# Patient Record
Sex: Female | Born: 1985
Health system: Southern US, Community
[De-identification: ages and names within clinical notes are randomized; demographics above are authoritative.]

## PROBLEM LIST (undated history)

## (undated) DIAGNOSIS — F32A Depression, unspecified: Secondary | ICD-10-CM

## (undated) DIAGNOSIS — J069 Acute upper respiratory infection, unspecified: Secondary | ICD-10-CM

## (undated) DIAGNOSIS — F419 Anxiety disorder, unspecified: Secondary | ICD-10-CM

## (undated) DIAGNOSIS — R87629 Unspecified abnormal cytological findings in specimens from vagina: Secondary | ICD-10-CM

## (undated) DIAGNOSIS — J45909 Unspecified asthma, uncomplicated: Secondary | ICD-10-CM

## (undated) DIAGNOSIS — F329 Major depressive disorder, single episode, unspecified: Secondary | ICD-10-CM

## (undated) DIAGNOSIS — Z8614 Personal history of Methicillin resistant Staphylococcus aureus infection: Secondary | ICD-10-CM

## (undated) DIAGNOSIS — L309 Dermatitis, unspecified: Secondary | ICD-10-CM

## (undated) DIAGNOSIS — J302 Other seasonal allergic rhinitis: Secondary | ICD-10-CM

## (undated) DIAGNOSIS — B977 Papillomavirus as the cause of diseases classified elsewhere: Secondary | ICD-10-CM

## (undated) DIAGNOSIS — T8189XA Other complications of procedures, not elsewhere classified, initial encounter: Secondary | ICD-10-CM

## (undated) DIAGNOSIS — B999 Unspecified infectious disease: Secondary | ICD-10-CM

## (undated) DIAGNOSIS — W57XXXA Bitten or stung by nonvenomous insect and other nonvenomous arthropods, initial encounter: Secondary | ICD-10-CM

## (undated) DIAGNOSIS — R51 Headache: Secondary | ICD-10-CM

## (undated) HISTORY — PX: WISDOM TOOTH EXTRACTION: SHX21

## (undated) HISTORY — PX: TONSILLECTOMY AND ADENOIDECTOMY: SHX28

## (undated) HISTORY — PX: ADENOIDECTOMY: SUR15

## (undated) HISTORY — DX: Acute upper respiratory infection, unspecified: J06.9

## (undated) HISTORY — PX: TONSILLECTOMY: SUR1361

## (undated) HISTORY — DX: Dermatitis, unspecified: L30.9

## (undated) HISTORY — PX: TYMPANOSTOMY TUBE PLACEMENT: SHX32

---
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
## 2006-01-27 DIAGNOSIS — Z8614 Personal history of Methicillin resistant Staphylococcus aureus infection: Secondary | ICD-10-CM

## 2006-01-27 HISTORY — DX: Personal history of Methicillin resistant Staphylococcus aureus infection: Z86.14

## 2012-01-13 DIAGNOSIS — H52209 Unspecified astigmatism, unspecified eye: Secondary | ICD-10-CM | POA: Insufficient documentation

## 2013-02-07 ENCOUNTER — Other Ambulatory Visit: Payer: Self-pay | Admitting: Family Medicine

## 2013-02-07 ENCOUNTER — Emergency Department (INDEPENDENT_AMBULATORY_CARE_PROVIDER_SITE_OTHER)
Admission: EM | Admit: 2013-02-07 | Discharge: 2013-02-07 | Disposition: A | Discharge status: Home / Self Care | Payer: 59 | Source: Home / Self Care | Attending: Family Medicine | Admitting: Family Medicine

## 2013-02-07 ENCOUNTER — Ambulatory Visit
Admission: RE | Admit: 2013-02-07 | Discharge: 2013-02-07 | Disposition: A | Discharge status: Home / Self Care | Payer: 59 | Source: Ambulatory Visit | Attending: Family Medicine | Admitting: Family Medicine

## 2013-02-07 ENCOUNTER — Encounter: Payer: Self-pay | Admitting: Emergency Medicine

## 2013-02-07 ENCOUNTER — Other Ambulatory Visit: Payer: 59

## 2013-02-07 ENCOUNTER — Telehealth: Payer: Self-pay | Admitting: *Deleted

## 2013-02-07 DIAGNOSIS — N6002 Solitary cyst of left breast: Secondary | ICD-10-CM

## 2013-02-07 DIAGNOSIS — N611 Abscess of the breast and nipple: Secondary | ICD-10-CM

## 2013-02-07 DIAGNOSIS — N6089 Other benign mammary dysplasias of unspecified breast: Secondary | ICD-10-CM

## 2013-02-07 DIAGNOSIS — N6082 Other benign mammary dysplasias of left breast: Secondary | ICD-10-CM

## 2013-02-07 DIAGNOSIS — N61 Mastitis without abscess: Secondary | ICD-10-CM

## 2013-02-07 HISTORY — DX: Unspecified asthma, uncomplicated: J45.909

## 2013-02-07 MED ORDER — TRAMADOL HCL 50 MG PO TABS: ORAL_TABLET | ORAL | Status: DC

## 2013-02-07 MED ORDER — DOXYCYCLINE HYCLATE 100 MG PO CAPS: 100.0000 mg | ORAL_CAPSULE | Freq: Two times a day (BID) | ORAL | Status: DC

## 2013-02-07 NOTE — ED Provider Notes (Signed)
CSN: [id number]     Arrival date & time 02/07/13  [id number] History   First MD Initiated Contact with Patient 02/07/13 [phone]     Chief Complaint  Patient presents with  . Abscess    left chest/breast      HPI Comments: Two days ago patient develop a painful swollen area on the medial edge of her left breast.  The area has become more swollen, tender and erythematous.  She had a low grade fever to 99.8.  No drainage from the area.  Patient is a 28 y.o. female presenting with abscess. The history is provided by the patient.  Abscess Abscess location: left breast. Size:  3cm Abscess quality: induration, painful and redness   Abscess quality: not draining, no fluctuance, no itching, no warmth and not weeping   Red streaking: no   Duration:  2 days Progression:  Worsening Pain details:    Quality:  Pressure   Severity:  Moderate   Duration:  2 days   Timing:  Constant   Progression:  Worsening Chronicity:  New Relieved by:  Nothing Worsened by:  Nothing tried Ineffective treatments:  None tried Associated symptoms: fever and nausea   Associated symptoms: no fatigue and no vomiting     Past Medical History  Diagnosis Date  . Asthma    Past Surgical History  Procedure Laterality Date  . Tonsillectomy and adenoidectomy    . Myringotomy     Family History  Problem Relation Age of Onset  . Hyperlipidemia Father   . Hypertension Father    History  Substance Use Topics  . Smoking status: Never Smoker   . Smokeless tobacco: Never Used  . Alcohol Use: No   OB History   Grav Para Term Preterm Abortions TAB SAB Ect Mult Living                 Review of Systems  Constitutional: Positive for fever. Negative for fatigue.  Gastrointestinal: Positive for nausea. Negative for vomiting.    Allergies  Polytrim  Home Medications   Current Outpatient Rx  Name  Route  Sig  Dispense  Refill  . azithromycin (ZITHROMAX) 250 MG tablet   Oral   Take by mouth daily. Currently taking  for sinus infection         . clindamycin (CLINDAGEL) 1 % gel   Topical   Apply topically 2 (two) times daily.         . fexofenadine (ALLEGRA) 180 MG tablet   Oral   Take 180 mg by mouth daily.         . Multiple Vitamin (MULTIVITAMIN) tablet   Oral   Take 1 tablet by mouth daily.         . Omega-3 Fatty Acids (FISH OIL CONCENTRATE PO)   Oral   Take by mouth.         . doxycycline (VIBRAMYCIN) 100 MG capsule   Oral   Take 1 capsule (100 mg total) by mouth 2 (two) times daily.   20 capsule   0   . traMADol (ULTRAM) 50 MG tablet      Take one or two tabs by mouth Q4 to 6hr as needed for pain Maximum dose= 8 tablets per day   20 tablet   0    BP 127/86  Pulse 60  Temp(Src) 98.5 F (36.9 C) (Oral)  Resp 14  Ht 5\' 3"  (1.6 m)  Wt 223 lb (101.152 kg)  BMI 39.51 kg/m2  SpO2 100%  LMP 02/07/2013 Physical Exam  Nursing note and vitals reviewed. Constitutional: She is oriented to person, place, and time. She appears well-developed and well-nourished. No distress.  Patient is obese (BMI 39.5)  HENT:  Head: Normocephalic.  Eyes: Conjunctivae are normal. Pupils are equal, round, and reactive to light.  Cardiovascular: Normal heart sounds.   Pulmonary/Chest: Breath sounds normal. She exhibits mass, tenderness and swelling. She exhibits no bony tenderness and no deformity. Left breast exhibits tenderness. Left breast exhibits no inverted nipple, no mass, no nipple discharge and no skin change. Breasts are symmetrical.    At the medial edge of left breast is a 3cm diameter tender erythematous indurated swollen area, as noted on diagram.  Musculoskeletal: She exhibits no tenderness.  Neurological: She is alert and oriented to person, place, and time.  Skin: Skin is warm and dry.    ED Course  Procedures  none   Imaging Review US Breast Ltd Uni Left Inc Axilla  02/07/2013   CLINICAL DATA:  Patient has an erythematous painful nodule at the sternal border at 8:30  o'clock on the left. This has developed rapidly since 02/05/2013.  EXAM: ULTRASOUND OF THE LEFT BREAST  COMPARISON:  NONE  FINDINGS: On physical exam,there is a 1.5 cm erythematous tender nodule at 8:30 o'clock at the left sternal border. The skin surrounding this nodule is erythematous.  Ultrasound is performed, showing an irregular fluid collection within the skin with a tract to the skin surface. This measures approximately 1.5 x 1.4 x 1.0 cm. This is consistent with an infected sebaceous cyst.  IMPRESSION: Infected sebaceous cyst at the sternal border on the left at 8:30 o'clock.  RECOMMENDATION: Suggest a course of antibiotics and clinical follow-up.  Report was telephoned to Dr. Cathren Harsh who will prescribe a course of antibiotics for the patient and will follow her clinically.  Yearly screening mammography should begin at age 76 unless clinically indicated earlier.  I have discussed the findings and recommendations with the patient. Results were also provided in writing at the conclusion of the visit. If applicable, a reminder letter will be sent to the patient regarding the next appointment.  BI-RADS CATEGORY  2: Benign Finding(s)   Electronically Signed   By: Cain Saupe M.D.   On: 02/07/2013 15:14      MDM   1. Breast abscess of female   2. Sebaceous cyst of breast, left    Begin doxycycline 100mg  BID.  Tramadol for pain.  Begin warm compresses.  Return in 48 hours for I and D.    Lattie Haw, MD 02/08/13 [phone]

## 2013-02-07 NOTE — ED Notes (Signed)
Deanna Gross c/o abscess to left chest/breast  X 2 days. Reports low grade fever and body aches last night, resolved today. Site is positive for pain redness and induration.

## 2013-02-07 NOTE — Discharge Instructions (Signed)
Breastfeeding and Mastitis °Mastitis is inflammation of the breast tissue. It can occur in women who are breastfeeding. This can make breastfeeding painful. Mastitis will sometimes go away on its own. Your health care provider will help determine if treatment is needed. °CAUSES °Mastitis is often associated with a blocked milk (lactiferous) duct. This can happen when too much milk builds up in the breast. Causes of excess milk in the breast can include: °· Poor latch-on. If your baby is not latched onto the breast properly, she or he may not empty your breast completely while breastfeeding. °· Allowing too much time to pass between feedings. °· Wearing a bra or other clothing that is too tight. This puts extra pressure on the lactiferous ducts so milk does not flow through them as it should. °Mastitis can also be caused by a bacterial infection. Bacteria may enter the breast tissue through cuts or openings in the skin. In women who are breastfeeding, this may occur because of cracked or irritated skin. Cracks in the skin are often caused when your baby does not latch on properly to the breast. °SIGNS AND SYMPTOMS °· Swelling, redness, tenderness, and pain in an area of the breast. °· Swelling of the glands under the arm on the same side. °· Fever may or may not accompany mastitis. °If an infection is allowed to progress, a collection of pus (abscess) may develop. °DIAGNOSIS  °Your health care provider can usually diagnose mastitis based on your symptoms and a physical exam. Tests may be done to help confirm the diagnosis. These may include: °· Removal of pus from the breast by applying pressure to the area. This pus can be examined in the lab to determine which bacteria are present. If an abscess has developed, the fluid in the abscess can be removed with a needle. This can also be used to confirm the diagnosis and determine the bacteria present. In most cases, pus will not be present. °· Blood tests to determine if  your body is fighting a bacterial infection. °· Mammogram or ultrasound tests to rule out other problems or diseases. °TREATMENT  °Mastitis that occurs with breastfeeding will sometimes go away on its own. Your health care provider may choose to wait 24 hours after first seeing you to decide whether a prescription medicine is needed. If your symptoms are worse after 24 hours, your health care provider will likely prescribe an antibiotic to treat the mastitis. He or she will determine which bacteria are most likely causing the infection and will then select an appropriate antibiotic. This is sometimes changed based on the results of tests performed to identify the bacteria, or if there is no response to the antibiotic selected. Antibiotics are usually given by mouth. You may also be given medicine for pain. °HOME CARE INSTRUCTIONS °· Only take over-the-counter or prescription medicines for pain, fever, or discomfort as directed by your health care provider. °· If your health care provider prescribed an antibiotic, take the medicine as directed. Make sure you finish it even if you start to feel better. °· Do not wear a tight or underwire bra. Wear a soft, supportive bra. °· Increase your fluid intake, especially if you have a fever. °· Continue to empty the breast. Your health care provider can tell you whether this milk is safe for your infant or needs to be thrown out. You may be told to stop nursing until your health care provider thinks it is safe for your baby. Use a breast pump if   you are advised to stop nursing. °· Keep your nipples clean and dry. °· Empty the first breast completely before going to the other breast. If your baby is not emptying your breasts completely for some reason, use a breast pump to empty your breasts. °· If you go back to work, pump your breasts while at work to stay in time with your nursing schedule. °· Avoid allowing your breasts to become overly filled with milk (engorged). °SEEK  MEDICAL CARE IF: °· You have pus-like discharge from the breast. °· Your symptoms do not improve with the treatment prescribed by your health care provider within 2 days. °SEEK IMMEDIATE MEDICAL CARE IF: °· Your pain and swelling are getting worse. °· You have pain that is not controlled with medicine. °· You have a red line extending from the breast toward your armpit. °· You have a fever or persistent symptoms for more than 2 3 days. °· You have a fever and your symptoms suddenly get worse. °MAKE SURE YOU:  °· Understand these instructions. °· Will watch your condition. °· Will get help right away if you are not doing well or get worse. °Document Released: 05/10/2004 Document Revised: 09/15/2012 Document Reviewed: 08/19/2012 °ExitCare® Patient Information ©2014 [employer]. ° ° °Abscess °An abscess is an infected area that contains a collection of pus and debris. It can occur in almost any part of the body. An abscess is also known as a furuncle or boil. °CAUSES  °An abscess occurs when tissue gets infected. This can occur from blockage of oil or sweat glands, infection of hair follicles, or a minor injury to the skin. As the body tries to fight the infection, pus collects in the area and creates pressure under the skin. This pressure causes pain. People with weakened immune systems have difficulty fighting infections and get certain abscesses more often.  °SYMPTOMS °Usually an abscess develops on the skin and becomes a painful mass that is red, warm, and tender. If the abscess forms under the skin, you may feel a moveable soft area under the skin. Some abscesses break open (rupture) on their own, but most will continue to get worse without care. The infection can spread deeper into the body and eventually into the bloodstream, causing you to feel ill.  °DIAGNOSIS  °Your caregiver will take your medical history and perform a physical exam. A sample of fluid may also be taken from the abscess to determine what is  causing your infection. °TREATMENT  °Your caregiver may prescribe antibiotic medicines to fight the infection. However, taking antibiotics alone usually does not cure an abscess. Your caregiver may need to make a small cut (incision) in the abscess to drain the pus. In some cases, gauze is packed into the abscess to reduce pain and to continue draining the area. °HOME CARE INSTRUCTIONS  °· Only take over-the-counter or prescription medicines for pain, discomfort, or fever as directed by your caregiver. °· If you were prescribed antibiotics, take them as directed. Finish them even if you start to feel better. °· If gauze is used, follow your caregiver's directions for changing the gauze. °· To avoid spreading the infection: °· Keep your draining abscess covered with a bandage. °· Wash your hands well. °· Do not share personal care items, towels, or whirlpools with others. °· Avoid skin contact with others. °· Keep your skin and clothes clean around the abscess. °· Keep all follow-up appointments as directed by your caregiver. °SEEK MEDICAL CARE IF:  °· You have increased   pain, swelling, redness, fluid drainage, or bleeding. °· You have muscle aches, chills, or a general ill feeling. °· You have a fever. °MAKE SURE YOU:  °· Understand these instructions. °· Will watch your condition. °· Will get help right away if you are not doing well or get worse. °Document Released: 10/23/2004 Document Revised: 07/15/2011 Document Reviewed: 03/28/2011 °ExitCare® Patient Information ©2014 [employer]. ° °

## 2013-02-09 ENCOUNTER — Emergency Department
Admission: EM | Admit: 2013-02-09 | Discharge: 2013-02-09 | Disposition: A | Discharge status: Home / Self Care | Payer: 59 | Source: Home / Self Care | Attending: Family Medicine | Admitting: Family Medicine

## 2013-02-09 ENCOUNTER — Encounter: Payer: Self-pay | Admitting: Emergency Medicine

## 2013-02-09 DIAGNOSIS — L723 Sebaceous cyst: Secondary | ICD-10-CM

## 2013-02-09 DIAGNOSIS — N6089 Other benign mammary dysplasias of unspecified breast: Secondary | ICD-10-CM

## 2013-02-09 DIAGNOSIS — L089 Local infection of the skin and subcutaneous tissue, unspecified: Secondary | ICD-10-CM

## 2013-02-09 DIAGNOSIS — N6082 Other benign mammary dysplasias of left breast: Secondary | ICD-10-CM

## 2013-02-09 MED ORDER — MELOXICAM 7.5 MG PO TABS: 7.5000 mg | ORAL_TABLET | Freq: Every day | ORAL | Status: DC

## 2013-02-09 MED ORDER — KETOROLAC TROMETHAMINE 30 MG/ML IJ SOLN
30.0000 mg | Freq: Once | INTRAMUSCULAR | Status: AC
  Administered 2013-02-09: 30 mg via INTRAMUSCULAR

## 2013-02-09 MED ORDER — HYDROCODONE-ACETAMINOPHEN 5-325 MG PO TABS: 1.0000 | ORAL_TABLET | Freq: Four times a day (QID) | ORAL | Status: DC | PRN

## 2013-02-09 MED ORDER — CLINDAMYCIN HCL 150 MG PO CAPS: 150.0000 mg | ORAL_CAPSULE | Freq: Four times a day (QID) | ORAL | Status: DC

## 2013-02-09 NOTE — Discharge Instructions (Signed)
Leave bandage in place until followup tomorrow.  Apply heating pad or hot water bottle several times daily.  Stop doxycycline for now.    Epidermal Cyst An epidermal cyst is usually a small, painless lump under the skin. Cysts often occur on the face, neck, stomach, chest, or genitals. The cyst may be filled with a bad smelling paste. Do not pop your cyst. Popping the cyst can cause pain and puffiness (swelling). HOME CARE   Only take medicines as told by your doctor.  Take your medicine (antibiotics) as told. Finish it even if you start to feel better. GET HELP RIGHT AWAY IF:  Your cyst is tender, red, or puffy.  You are not getting better, or you are getting worse.  You have any questions or concerns. MAKE SURE YOU:  Understand these instructions.  Will watch your condition.  Will get help right away if you are not doing well or get worse. Document Released: 02/21/2004 Document Revised: 07/15/2011 Document Reviewed: 07/22/2010 [facility] Patient Information 2014 [place], [place].

## 2013-02-09 NOTE — ED Notes (Signed)
The pt is here today for a f/u of the abscess under her LT breast x 4 days. Denies fever.

## 2013-02-09 NOTE — ED Provider Notes (Signed)
CSN: [id number]     Arrival date & time 02/09/13  1018 History   First MD Initiated Contact with Patient 02/09/13 1113     Chief Complaint  Patient presents with  . Breast Mass      HPI Comments: Patient returns today for I and D of sebaceous cyst at medial border of left breast.  She reports that redness has decreased somewhat while taking doxycycline but area is still painful.  No fevers, chills, and sweats.  No drainage from lesion.  The history is provided by the patient.    Past Medical History  Diagnosis Date  . Asthma    Past Surgical History  Procedure Laterality Date  . Tonsillectomy and adenoidectomy    . Myringotomy     Family History  Problem Relation Age of Onset  . Hyperlipidemia Father   . Hypertension Father    History  Substance Use Topics  . Smoking status: Never Smoker   . Smokeless tobacco: Never Used  . Alcohol Use: No   OB History   Grav Para Term Preterm Abortions TAB SAB Ect Mult Living                 Review of Systems  Constitutional: Negative for fever, chills and fatigue.  Skin:       Persistent painful nodule left medial breast.  All other systems reviewed and are negative.    Allergies  Polytrim  Home Medications   Current Outpatient Rx  Name  Route  Sig  Dispense  Refill  . azithromycin (ZITHROMAX) 250 MG tablet   Oral   Take by mouth daily. Currently taking for sinus infection         . clindamycin (CLEOCIN) 150 MG capsule   Oral   Take 1 capsule (150 mg total) by mouth 4 (four) times daily.   28 capsule   0   . clindamycin (CLINDAGEL) 1 % gel   Topical   Apply topically 2 (two) times daily.         Marland Kitchen doxycycline (VIBRAMYCIN) 100 MG capsule   Oral   Take 1 capsule (100 mg total) by mouth 2 (two) times daily.   20 capsule   0   . fexofenadine (ALLEGRA) 180 MG tablet   Oral   Take 180 mg by mouth daily.         Marland Kitchen HYDROcodone-acetaminophen (NORCO/VICODIN) 5-325 MG per tablet   Oral   Take 1 tablet by  mouth every 6 (six) hours as needed for moderate pain.   12 tablet   0   . meloxicam (MOBIC) 7.5 MG tablet   Oral   Take 1 tablet (7.5 mg total) by mouth daily. Take with food   7 tablet   1   . Multiple Vitamin (MULTIVITAMIN) tablet   Oral   Take 1 tablet by mouth daily.         . Omega-3 Fatty Acids (FISH OIL CONCENTRATE PO)   Oral   Take by mouth.         . traMADol (ULTRAM) 50 MG tablet      Take one or two tabs by mouth Q4 to 6hr as needed for pain Maximum dose= 8 tablets per day   20 tablet   0    BP 119/81  Pulse 72  Temp(Src) 97.9 F (36.6 C) (Oral)  Resp 18  SpO2 100%  LMP 02/07/2013 Physical Exam  Nursing note and vitals reviewed. Constitutional: She is oriented to person, place,  and time. She appears well-developed and well-nourished. No distress.  Eyes: Conjunctivae are normal. Pupils are equal, round, and reactive to light.  Neck: Neck supple.  Pulmonary/Chest: Left breast exhibits mass and tenderness.    Persistent 5cm dia area of erythema, swelling, and tenderness.  Erythema is decreased somewhat but lesion remains tender to palpation.  Lesion is more indurated than fluctuant.  Lymphadenopathy:    She has no cervical adenopathy.  Neurological: She is alert and oriented to person, place, and time.  Skin: Skin is warm and dry.    ED Course  Procedures  Incise and drain cyst/abscess Risks and benefits of procedure explained to patient and verbal consent obtained.  Using sterile technique and local anesthesia with 1% lidocaine with epinephrine, cleansed affected area with Betadine and alcohol. Identified the central area of lesion and incised with #11 blade.  Expressed blood and minimal amount of sebaceous material.  Probed wound cavity circumferentially; unable to locate a significant cyst sac. Inserted a short length of Iodoform in order to maintain patency of wound.  Bandage applied.  Patient tolerated well     Labs Reviewed  WOUND CULTURE    Imaging Review [place] Breast Ltd Uni Left Inc Axilla  02/07/2013   CLINICAL DATA:  Patient has an erythematous painful nodule at the sternal border at 8:30 o'clock on the left. This has developed rapidly since 02/05/2013.  EXAM: ULTRASOUND OF THE LEFT BREAST  COMPARISON:  NONE  FINDINGS: On physical exam,there is a 1.5 cm erythematous tender nodule at 8:30 o'clock at the left sternal border. The skin surrounding this nodule is erythematous.  Ultrasound is performed, showing an irregular fluid collection within the skin with a tract to the skin surface. This measures approximately 1.5 x 1.4 x 1.0 cm. This is consistent with an infected sebaceous cyst.  IMPRESSION: Infected sebaceous cyst at the sternal border on the left at 8:30 o'clock.  RECOMMENDATION: Suggest a course of antibiotics and clinical follow-up.  Report was telephoned to Dr. Cathren HarshBeese who will prescribe a course of antibiotics for the patient and will follow her clinically.  Yearly screening mammography should begin at age 28 unless clinically indicated earlier.  I have discussed the findings and recommendations with the patient. Results were also provided in writing at the conclusion of the visit. If applicable, a reminder letter will be sent to the patient regarding the next appointment.  BI-RADS CATEGORY  2: Benign Finding(s)   Electronically Signed   By: Cain SaupeElizabeth  Eagle M.D.   On: 02/07/2013 15:14      MDM   1. Sebaceous cyst of skin of left breast; concerned that minimal sebaceous material expressed with I and D   2. Infected sebaceous cyst     Wound culture taken.  Begin Clindamycin.  Lortab for pain.  Begin Mobic. Leave bandage in place until followup tomorrow.  Apply heating pad or hot water bottle several times daily.  Stop doxycycline for now. Return tomorrow for follow-up and removal of wick.  If not clinically improving would refer to surgeon.    Lattie HawStephen A Seira Cody, MD 02/09/13 [phone]

## 2013-02-10 ENCOUNTER — Encounter (INDEPENDENT_AMBULATORY_CARE_PROVIDER_SITE_OTHER): Payer: Self-pay | Admitting: Surgery

## 2013-02-10 ENCOUNTER — Emergency Department
Admission: EM | Admit: 2013-02-10 | Discharge: 2013-02-10 | Disposition: A | Discharge status: Home / Self Care | Payer: 59 | Source: Home / Self Care | Attending: Family Medicine | Admitting: Family Medicine

## 2013-02-10 ENCOUNTER — Ambulatory Visit (INDEPENDENT_AMBULATORY_CARE_PROVIDER_SITE_OTHER): Payer: Commercial Managed Care - PPO | Admitting: Surgery

## 2013-02-10 ENCOUNTER — Encounter: Payer: Self-pay | Admitting: Emergency Medicine

## 2013-02-10 ENCOUNTER — Telehealth: Payer: Self-pay | Admitting: *Deleted

## 2013-02-10 VITALS — BP 122/84 | HR 71 | Temp 98.4°F | Resp 16 | Ht 64.0 in | Wt 222.6 lb

## 2013-02-10 DIAGNOSIS — L02219 Cutaneous abscess of trunk, unspecified: Secondary | ICD-10-CM

## 2013-02-10 DIAGNOSIS — N611 Abscess of the breast and nipple: Secondary | ICD-10-CM

## 2013-02-10 DIAGNOSIS — N6089 Other benign mammary dysplasias of unspecified breast: Secondary | ICD-10-CM

## 2013-02-10 DIAGNOSIS — L03319 Cellulitis of trunk, unspecified: Principal | ICD-10-CM

## 2013-02-10 NOTE — ED Notes (Signed)
Deanna Gross is here for dressing change and wound check for abscess of left breast. Pt reports pain is improved.

## 2013-02-10 NOTE — Patient Instructions (Signed)
Shower wound at least daily and use antibacterial soap.  Remove packing from wound on Saturday, 02/12/2013.  Keep the wound covered with dry gauze dressing and change as needed.  Take entire course of clindamycin as prescribed.  Return for surgical assessment 4-5 days.  Velora Hecklerodd M. Lizett Chowning, MD, [facility] LLCFACS [facility] Office: [phone]

## 2013-02-10 NOTE — ED Provider Notes (Signed)
CSN: [id number]     Arrival date & time 02/10/13  1021 History   First MD Initiated Contact with Patient 02/10/13 1026     Chief Complaint  Patient presents with  . Wound Check    HPI  Pt presents today for wound recheck  Was originally seen on 1/12 for breast abscess. Had L breast u/s indicative of sebaceous cyst (infected)  Area was I and D'd here at [facility].  Wound culture obtained- still pending.  Was placed on doxy, then swiched to clinda at follow up yesterday Symptomatically, pt still with persistent pain. Mild improvement in redness.  No fevers or chills.  Increased drainage of foul smelling, purulent fluid.    Past Medical History  Diagnosis Date  . Asthma    Past Surgical History  Procedure Laterality Date  . Tonsillectomy and adenoidectomy    . Myringotomy     Family History  Problem Relation Age of Onset  . Hyperlipidemia Father   . Hypertension Father    History  Substance Use Topics  . Smoking status: Never Smoker   . Smokeless tobacco: Never Used  . Alcohol Use: No   OB History   Grav Para Term Preterm Abortions TAB SAB Ect Mult Living                 Review of Systems  All other systems reviewed and are negative.    Allergies  Polytrim  Home Medications   Current Outpatient Rx  Name  Route  Sig  Dispense  Refill  . azithromycin (ZITHROMAX) 250 MG tablet   Oral   Take by mouth daily. Currently taking for sinus infection         . clindamycin (CLEOCIN) 150 MG capsule   Oral   Take 1 capsule (150 mg total) by mouth 4 (four) times daily.   28 capsule   0   . clindamycin (CLINDAGEL) 1 % gel   Topical   Apply topically 2 (two) times daily.         Marland Kitchen. doxycycline (VIBRAMYCIN) 100 MG capsule   Oral   Take 1 capsule (100 mg total) by mouth 2 (two) times daily.   20 capsule   0   . fexofenadine (ALLEGRA) 180 MG tablet   Oral   Take 180 mg by mouth daily.         Marland Kitchen. HYDROcodone-acetaminophen (NORCO/VICODIN) 5-325 MG per tablet  Oral   Take 1 tablet by mouth every 6 (six) hours as needed for moderate pain.   12 tablet   0   . meloxicam (MOBIC) 7.5 MG tablet   Oral   Take 1 tablet (7.5 mg total) by mouth daily. Take with food   7 tablet   1   . Multiple Vitamin (MULTIVITAMIN) tablet   Oral   Take 1 tablet by mouth daily.         . Omega-3 Fatty Acids (FISH OIL CONCENTRATE PO)   Oral   Take by mouth.         . traMADol (ULTRAM) 50 MG tablet      Take one or two tabs by mouth Q4 to 6hr as needed for pain Maximum dose= 8 tablets per day   20 tablet   0    LMP 02/07/2013 Physical Exam  Constitutional: She appears well-developed and well-nourished.  HENT:  Head: Normocephalic and atraumatic.  Eyes: Conjunctivae are normal. Pupils are equal, round, and reactive to light.  Neck: Normal range of motion. Neck  supple.  Cardiovascular: Normal rate and regular rhythm.   Pulmonary/Chest:    Abdominal: Soft.  Musculoskeletal: Normal range of motion.  Skin: Rash noted.    ED Course  INCISION AND DRAINAGE Date/Time: 02/10/2013 11:15 AM Performed by: Doree Albee Authorized by: Doree Albee Consent: Verbal consent obtained. Risks and benefits: risks, benefits and alternatives were discussed Type: abscess Location: L breast border  Local anesthetic: lidocaine 2% without epinephrine Scalpel size: 11 Packing material: 1/4 in iodoform gauze Comments: Interior of wound was preliminarily irrigated with 2% lidocaine without epinephrine prior to wound repacking. There was still a fair amount of induration and erythema superior to incision site. Incision site was minimally expanded with #11 under sterile technique with expression of fairly copious amount of purulent, foul smelling fluid. Area was repacked with iodoform gauze.    (including critical care time) Labs Review Labs Reviewed - No data to display Imaging Review No results found.  EKG Interpretation    Date/Time:    Ventricular Rate:      PR Interval:    QRS Duration:   QT Interval:    QTC Calculation:   R Axis:     Text Interpretation:              MDM   1. Breast abscess   2. Sebaceous cyst of breast    Given distribution of abscess as well as lack of improvement with what seems to be partially drained abscess, will refer pt to surgery today for reevaluation.  No systemic signs of infection today Will set up same day appt for eval today.  Pt expressed understanding. Agreeable to plan.     The patient and/or caregiver has been counseled thoroughly with regard to treatment plan and/or medications prescribed including dosage, schedule, interactions, rationale for use, and possible side effects and they verbalize understanding. Diagnoses and expected course of recovery discussed and will return if not improved as expected or if the condition worsens. Patient and/or caregiver verbalized understanding.         Doree Albee, MD 02/10/13 1122

## 2013-02-10 NOTE — Progress Notes (Signed)
General Surgery [facility]  Chief Complaint  Patient presents with  . New Evaluation    Breast cyst with abscess - referral from [facility] Loel DubonnetSteve Beese, [facility]    HISTORY: The patient is a 28 year old female who developed erythema and induration and tenderness at the medial left breast. She was evaluated including a breast ultrasound which showed what appears to be an infected sebaceous cyst at the medial aspect of the left breast. Patient has undergone 2 incision and drainage procedures as an outpatient. She is currently taking clindamycin. She is referred to surgery for further evaluation and recommendations.  PERTINENT REVIEW OF SYSTEMS: The patient has noted pain at the medial left breast for several days. She has noted small drainage since incision and drainage procedure.  EXAM: HEENT: normocephalic; pupils equal and reactive; sclerae clear; dentition good; mucous membranes moist NECK:  symmetric on extension; no palpable anterior or posterior cervical lymphadenopathy; no supraclavicular masses; no tenderness CHEST: clear to auscultation bilaterally without rales, rhonchi, or wheezes; surgical wound measuring 5 mm in length at medial aspect of left breast; induration and erythema surrounding this wound approximately 3 cm in diameter; markedly tender; wound is probed with a Q-tip to a depth of 2 cm with purulent drainage; wound is packed with quarter inch iodoform gauze packing and covered with dry gauze dressing CARDIAC: regular rate and rhythm without significant murmur; peripheral pulses are full EXT:  non-tender without edema; no deformity NEURO: no gross focal deficits; no sign of tremor   IMPRESSION: Abscess left medial breast, likely infected sebaceous cyst  PLAN: Patient and I discussed the above findings. I would like for her to remain on clindamycin. I've asked her to remove the packing in 48 hours. I've asked her to do warm showers and to cover  the wound with dry gauze dressing. She will return in 4-5 days for wound check here at the surgical office. If this resolves without further surgical intervention, then she should have definitive surgical excision of the sebaceous cyst and approximately 3-4 weeks.  We will make arrangements for surgical assessment and 4-5 days here at our office.  Velora Hecklerodd M. Kylie Simmonds, MD, [facility] Office: [phone]  Visit Diagnoses: 1. Cellulitis and abscess of trunk

## 2013-02-12 LAB — WOUND CULTURE
GRAM STAIN: NONE SEEN
GRAM STAIN: NONE SEEN
ORGANISM ID, BACTERIA: NO GROWTH

## 2013-02-14 ENCOUNTER — Encounter (INDEPENDENT_AMBULATORY_CARE_PROVIDER_SITE_OTHER): Payer: Self-pay | Admitting: General Surgery

## 2013-02-14 ENCOUNTER — Encounter (INDEPENDENT_AMBULATORY_CARE_PROVIDER_SITE_OTHER): Payer: Self-pay

## 2013-02-14 ENCOUNTER — Ambulatory Visit (INDEPENDENT_AMBULATORY_CARE_PROVIDER_SITE_OTHER): Payer: Commercial Managed Care - PPO | Admitting: General Surgery

## 2013-02-14 VITALS — BP 130/88 | HR 76 | Temp 98.2°F | Resp 16 | Ht 63.0 in | Wt 222.0 lb

## 2013-02-14 DIAGNOSIS — L02219 Cutaneous abscess of trunk, unspecified: Secondary | ICD-10-CM

## 2013-02-14 DIAGNOSIS — L03319 Cellulitis of trunk, unspecified: Principal | ICD-10-CM

## 2013-02-14 NOTE — Progress Notes (Signed)
Subjective:     Patient ID: Deanna Gross, female   DOB: 22-Sep-1985, 28 y.o.   MRN: [id number]  HPI 28 year old Caucasian female comes in for a wound check regarding an infected sebaceous cyst along the medial border of her left breast. She had previously undergone 2 I&D's as an outpatient. She saw Dr Gerrit FriendsGerkin on Friday. He changed her antibiotic and placed a packing strip. She reports that the area still tender. She has been taking her antibiotic. She denies any fevers or chills. She reports some daily drainage. She has been changing the outer bandage. She has not returned to work.  PMHx, PSHx, SOCHx, FAMHx, ALL reviewed and unchanged  Review of Systems 6 point ROS performed and negative except for above    Objective:   Physical Exam  Pulmonary/Chest:     BP 130/88  Pulse 76  Temp(Src) 98.2 F (36.8 C) (Temporal)  Resp 16  Ht 5\' 3"  (1.6 m)  Wt 222 lb (100.699 kg)  BMI 39.34 kg/m2  LMP 02/07/2013 Alert, no apparent distress Left breast - medical left breast wound - opening about 4mm; has a little cellulitis along sternal border. Has about 5mm of surrounding induration along open wound. No drainage. Packing strip removed.     Assessment:     Infected Left breast epidermal inclusion cyst     Plan:     She was instructed to finish out her antibiotics. She still has some ongoing mild induration. I repacked it to keep the skin edges separated. She was instructed to change the outer gauze daily. I also asked her to remove the packing strip on Wednesday and then just cover the area with a dry gauze. We will have Dr. Sid FalconGerkins assistant contact her regarding followup to talk about definitive surgical excision. It'll probably be at least 3 weeks Before the area is ready for definitive surgery. She was given a excuse for work for this past Friday Monday and Tuesday  Mary Sellaric M. Andrey CampanileWilson, MD, FACS General, Bariatric, & Minimally Invasive Surgery [facility] [place]

## 2013-02-14 NOTE — Patient Instructions (Signed)
Change bandage daily Finish antibiotic Remove packing strip on Wednesday, and cover with dry gauze and change daily Our office will contact you regarding follow up with Dr Gerrit FriendsGerkin - please call us if you do not hear from us by Thursday of this week

## 2013-02-18 ENCOUNTER — Telehealth (INDEPENDENT_AMBULATORY_CARE_PROVIDER_SITE_OTHER): Payer: Self-pay

## 2013-02-18 NOTE — Telephone Encounter (Signed)
Pt called stating cyst area soreness has increased. She states area is not more swollen but is still draining. I reviewed this with Dr Gerrit FriendsGerkin and pt advised to finish antibiotics. Use warm compresses 3-4 x a day. Cover dry dressing and see him next week. Call if not improving. Pt given appt for Monday. I advised pt to D/C bra as much as possible since this comes across cyst. Pt will call if area gets worse or does not improve.

## 2013-02-21 ENCOUNTER — Encounter (INDEPENDENT_AMBULATORY_CARE_PROVIDER_SITE_OTHER): Payer: Self-pay

## 2013-02-21 ENCOUNTER — Ambulatory Visit (INDEPENDENT_AMBULATORY_CARE_PROVIDER_SITE_OTHER): Payer: Commercial Managed Care - PPO | Admitting: Surgery

## 2013-02-21 ENCOUNTER — Encounter (INDEPENDENT_AMBULATORY_CARE_PROVIDER_SITE_OTHER): Payer: Self-pay | Admitting: Surgery

## 2013-02-21 VITALS — BP 138/82 | HR 88 | Temp 98.2°F | Resp 18 | Ht 62.0 in | Wt 223.0 lb

## 2013-02-21 DIAGNOSIS — L03319 Cellulitis of trunk, unspecified: Principal | ICD-10-CM

## 2013-02-21 DIAGNOSIS — L02219 Cutaneous abscess of trunk, unspecified: Secondary | ICD-10-CM

## 2013-02-21 NOTE — Patient Instructions (Signed)
  [facility] -- DISCHARGE INSTRUCTIONS  REMINDER:   Carry a list of your medications and allergies with you at all times  Call your pharmacy at least 1 week in advance to refill prescriptions  Do not mix any prescribed pain medicine with alcohol  Do not drive any motor vehicles while taking pain medication  Take medications with food unless otherwise directed  Follow-up appointments (date to return to physician): Please call [phone] to confirm your follow up appointment with your surgeon.  Call your Surgeon if you have:  Temperature greater than 101.0  Persistent nausea and vomiting  Severe uncontrolled pain  Redness, tenderness, or signs of infection (pain, swelling, redness, odor or    green/yellow discharge around the site)  Difficulty breathing, headache or visual disturbances  Hives  Persistent dizziness or light-headedness  Any other questions or concerns you may have after discharge  In an emergency, call 911 or go to an Emergency Department at a nearby hospital.  Diet: Begin with liquids, and if they are tolerated, resume your usual diet.  Avoid spicy, greasy or heavy foods.  If you have nausea or vomiting, go back to liquids.  If you cannot keep liquids down, call your doctor.  Avoid alcohol consumption while on prescription pain medications. Good nutrition promotes healing. Increase fiber and fluids.   ADDITIONAL INSTRUCTIONS:   Rolonda Pontarelli M. Salar Molden, MD, FACS [facility] Office: [phone] 

## 2013-02-21 NOTE — Progress Notes (Signed)
General Surgery [facility]  Chief Complaint  Patient presents with  . Wound Check    inflammed cyst left breast    HISTORY: The patient is a 28 year old female been managed for an abscessed cyst at the medial aspect of the left breast. She has completed her course of oral antibiotics. Packing was removed from the wound last week. She has been using an antibacterial soap to bathe. She has been doing warm compresses. She returns today for wound check.  EXAM: The wound has now closed and is mostly epithelialized. There is no drainage. There is no fluctuance. There is mild induration and mild tenderness. There is no sign of cellulitis.  IMPRESSION: Resolving abscess and cellulitis left medial breast  PLAN: Patient will continue local wound care with triple antibiotic ointment and will use antibacterial soap to bathe. We will schedule her for surgical excision of this lesion in approximately 3 weeks. That can be performed under anesthesia as an outpatient procedure. We discussed primary closure versus the potential need to leave the wound open and allow it to heal by secondary intention. She understands.  The risks and benefits of the procedure have been discussed at length with the patient.  The patient understands the proposed procedure, potential alternative treatments, and the course of recovery to be expected.  All of the patient's questions have been answered at this time.  The patient wishes to proceed with surgery.   Velora Hecklerodd M. Virgen Belland, MD, FACS General & Endocrine Surgery [facility]   Visit Diagnoses: 1. Cellulitis and abscess of trunk

## 2013-02-23 ENCOUNTER — Encounter (INDEPENDENT_AMBULATORY_CARE_PROVIDER_SITE_OTHER): Payer: Self-pay

## 2013-02-28 ENCOUNTER — Encounter (INDEPENDENT_AMBULATORY_CARE_PROVIDER_SITE_OTHER): Payer: Self-pay

## 2013-02-28 ENCOUNTER — Ambulatory Visit (INDEPENDENT_AMBULATORY_CARE_PROVIDER_SITE_OTHER): Payer: Commercial Managed Care - PPO | Admitting: Surgery

## 2013-02-28 ENCOUNTER — Encounter (INDEPENDENT_AMBULATORY_CARE_PROVIDER_SITE_OTHER): Payer: Self-pay | Admitting: Surgery

## 2013-02-28 VITALS — BP 122/82 | HR 72 | Temp 98.1°F | Resp 18 | Ht 62.0 in | Wt 225.0 lb

## 2013-02-28 DIAGNOSIS — L03319 Cellulitis of trunk, unspecified: Principal | ICD-10-CM

## 2013-02-28 DIAGNOSIS — L02219 Cutaneous abscess of trunk, unspecified: Secondary | ICD-10-CM

## 2013-02-28 NOTE — Progress Notes (Signed)
General Surgery [facility]  Chief Complaint  Patient presents with  . 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 Over the past 2 days she is noted increased drainage and discomfort. She presents today for evaluation.  EXAM: Wound had medial breast is slightly ulcerated for approximately 5 mm. There is serosanguineous drainage. There is decreased induration but still mild to moderate tenderness. There is no cellulitis.  IMPRESSION: Infected epidermal inclusion cyst left medial breast  PLAN: Patient will start on doxycycline 100 mg twice daily until surgery which is scheduled in approximately 2-1/2 weeks. Patient will be out of work for 2 weeks following the procedure in case open dressing changes are required.  Velora Hecklerodd M. Wen Merced, MD, FACS General & Endocrine Surgery [facility]   Visit Diagnoses: 1. Cellulitis and abscess of trunk

## 2013-02-28 NOTE — Patient Instructions (Signed)
Wound care as instructed.  Velora Hecklerodd M. Wilferd Ritson, MD, [facility] Office: [phone]

## 2013-03-16 ENCOUNTER — Encounter (HOSPITAL_BASED_OUTPATIENT_CLINIC_OR_DEPARTMENT_OTHER): Payer: Self-pay | Admitting: *Deleted

## 2013-03-17 ENCOUNTER — Encounter (HOSPITAL_BASED_OUTPATIENT_CLINIC_OR_DEPARTMENT_OTHER): Payer: Self-pay

## 2013-03-18 ENCOUNTER — Encounter (HOSPITAL_BASED_OUTPATIENT_CLINIC_OR_DEPARTMENT_OTHER): Payer: 59 | Admitting: Anesthesiology

## 2013-03-18 ENCOUNTER — Encounter (HOSPITAL_BASED_OUTPATIENT_CLINIC_OR_DEPARTMENT_OTHER)
Admission: RE | Disposition: A | Discharge status: Home / Self Care | Payer: Self-pay | Source: Ambulatory Visit | Attending: Surgery

## 2013-03-18 ENCOUNTER — Ambulatory Visit (HOSPITAL_BASED_OUTPATIENT_CLINIC_OR_DEPARTMENT_OTHER)
Admission: RE | Admit: 2013-03-18 | Discharge: 2013-03-18 | Disposition: A | Discharge status: Home / Self Care | Payer: 59 | Source: Ambulatory Visit | Attending: Surgery | Admitting: Surgery

## 2013-03-18 ENCOUNTER — Encounter (HOSPITAL_BASED_OUTPATIENT_CLINIC_OR_DEPARTMENT_OTHER): Payer: Self-pay

## 2013-03-18 ENCOUNTER — Ambulatory Visit (HOSPITAL_BASED_OUTPATIENT_CLINIC_OR_DEPARTMENT_OTHER): Payer: 59 | Admitting: Anesthesiology

## 2013-03-18 DIAGNOSIS — L03319 Cellulitis of trunk, unspecified: Secondary | ICD-10-CM

## 2013-03-18 DIAGNOSIS — L02219 Cutaneous abscess of trunk, unspecified: Secondary | ICD-10-CM | POA: Diagnosis present

## 2013-03-18 DIAGNOSIS — N6009 Solitary cyst of unspecified breast: Secondary | ICD-10-CM

## 2013-03-18 DIAGNOSIS — L723 Sebaceous cyst: Secondary | ICD-10-CM | POA: Insufficient documentation

## 2013-03-18 HISTORY — PX: BREAST CYST EXCISION: SHX579

## 2013-03-18 HISTORY — DX: Headache: R51

## 2013-03-18 SURGERY — EXCISION, CYST, BREAST
Anesthesia: General | Site: Breast | Laterality: Left

## 2013-03-18 MED ORDER — PROPOFOL 10 MG/ML IV BOLUS
INTRAVENOUS | Status: DC | PRN
  Administered 2013-03-18: 150 mg via INTRAVENOUS

## 2013-03-18 MED ORDER — HYDROMORPHONE HCL PF 1 MG/ML IJ SOLN
0.2500 mg | INTRAMUSCULAR | Status: DC | PRN
  Administered 2013-03-18 (×3): 0.5 mg via INTRAVENOUS

## 2013-03-18 MED ORDER — HYDROMORPHONE HCL PF 1 MG/ML IJ SOLN
INTRAMUSCULAR | Status: AC
  Filled 2013-03-18: qty 1

## 2013-03-18 MED ORDER — BUPIVACAINE HCL (PF) 0.5 % IJ SOLN
INTRAMUSCULAR | Status: AC
  Filled 2013-03-18: qty 30

## 2013-03-18 MED ORDER — FENTANYL CITRATE 0.05 MG/ML IJ SOLN
INTRAMUSCULAR | Status: DC | PRN
  Administered 2013-03-18: 100 ug via INTRAVENOUS

## 2013-03-18 MED ORDER — MIDAZOLAM HCL 2 MG/2ML IJ SOLN: 1.0000 mg | INTRAMUSCULAR | Status: DC | PRN

## 2013-03-18 MED ORDER — HYDROCODONE-ACETAMINOPHEN 5-325 MG PO TABS
1.0000 | ORAL_TABLET | Freq: Once | ORAL | Status: AC
  Administered 2013-03-18: 1 via ORAL

## 2013-03-18 MED ORDER — MIDAZOLAM HCL 5 MG/5ML IJ SOLN
INTRAMUSCULAR | Status: DC | PRN
  Administered 2013-03-18: 2 mg via INTRAVENOUS

## 2013-03-18 MED ORDER — HYDROCODONE-ACETAMINOPHEN 5-325 MG PO TABS: 1.0000 | ORAL_TABLET | ORAL | Status: DC | PRN

## 2013-03-18 MED ORDER — ONDANSETRON HCL 4 MG/2ML IJ SOLN
INTRAMUSCULAR | Status: DC | PRN
  Administered 2013-03-18: 4 mg via INTRAVENOUS

## 2013-03-18 MED ORDER — HYDROCODONE-ACETAMINOPHEN 5-325 MG PO TABS
ORAL_TABLET | ORAL | Status: AC
  Filled 2013-03-18: qty 1

## 2013-03-18 MED ORDER — CEFAZOLIN SODIUM-DEXTROSE 2-3 GM-% IV SOLR
2.0000 g | INTRAVENOUS | Status: AC
  Administered 2013-03-18: 2 g via INTRAVENOUS

## 2013-03-18 MED ORDER — MIDAZOLAM HCL 2 MG/2ML IJ SOLN
INTRAMUSCULAR | Status: AC
  Filled 2013-03-18: qty 2

## 2013-03-18 MED ORDER — LIDOCAINE HCL (PF) 1 % IJ SOLN
INTRAMUSCULAR | Status: AC
  Filled 2013-03-18: qty 30

## 2013-03-18 MED ORDER — FENTANYL CITRATE 0.05 MG/ML IJ SOLN: 50.0000 ug | INTRAMUSCULAR | Status: DC | PRN

## 2013-03-18 MED ORDER — ONDANSETRON HCL 4 MG/2ML IJ SOLN: 4.0000 mg | Freq: Once | INTRAMUSCULAR | Status: DC | PRN

## 2013-03-18 MED ORDER — FENTANYL CITRATE 0.05 MG/ML IJ SOLN
INTRAMUSCULAR | Status: AC
  Filled 2013-03-18: qty 4

## 2013-03-18 MED ORDER — DEXAMETHASONE SODIUM PHOSPHATE 4 MG/ML IJ SOLN
INTRAMUSCULAR | Status: DC | PRN
  Administered 2013-03-18: 10 mg via INTRAVENOUS

## 2013-03-18 MED ORDER — LIDOCAINE HCL (CARDIAC) 20 MG/ML IV SOLN
INTRAVENOUS | Status: DC | PRN
  Administered 2013-03-18: 75 mg via INTRAVENOUS

## 2013-03-18 MED ORDER — LACTATED RINGERS IV SOLN
INTRAVENOUS | Status: DC
  Administered 2013-03-18 (×2): via INTRAVENOUS

## 2013-03-18 MED ORDER — BUPIVACAINE-EPINEPHRINE 0.25% -1:200000 IJ SOLN
INTRAMUSCULAR | Status: DC | PRN
  Administered 2013-03-18: 10 mL

## 2013-03-18 MED ORDER — CEFAZOLIN SODIUM-DEXTROSE 2-3 GM-% IV SOLR
INTRAVENOUS | Status: AC
  Filled 2013-03-18: qty 50

## 2013-03-18 SURGICAL SUPPLY — 40 items
BENZOIN TINCTURE PRP APPL 2/3 (GAUZE/BANDAGES/DRESSINGS) ×3 IMPLANT
BLADE HEX COATED 2.75 (ELECTRODE) ×3 IMPLANT
BLADE SURG 15 STRL LF DISP TIS (BLADE) ×1 IMPLANT
BLADE SURG 15 STRL SS (BLADE) ×2
CANISTER SUCT 1200ML W/VALVE (MISCELLANEOUS) IMPLANT
CHLORAPREP W/TINT 26ML (MISCELLANEOUS) ×3 IMPLANT
CLIP TI WIDE RED SMALL 6 (CLIP) IMPLANT
CLOSURE WOUND 1/2 X4 (GAUZE/BANDAGES/DRESSINGS) ×1
COVER MAYO STAND STRL (DRAPES) ×3 IMPLANT
COVER TABLE BACK 60X90 (DRAPES) ×3 IMPLANT
DECANTER SPIKE VIAL GLASS SM (MISCELLANEOUS) IMPLANT
DEVICE DUBIN W/COMP PLATE 8390 (MISCELLANEOUS) IMPLANT
DRAPE PED LAPAROTOMY (DRAPES) ×3 IMPLANT
DRAPE UTILITY XL STRL (DRAPES) ×3 IMPLANT
ELECT REM PT RETURN 9FT ADLT (ELECTROSURGICAL) ×3
ELECTRODE REM PT RTRN 9FT ADLT (ELECTROSURGICAL) ×1 IMPLANT
GLOVE BIOGEL PI IND STRL 7.5 (GLOVE) ×1 IMPLANT
GLOVE BIOGEL PI INDICATOR 7.5 (GLOVE) ×2
GLOVE SURG ORTHO 8.0 STRL STRW (GLOVE) ×3 IMPLANT
GLOVE SURG SS PI 7.0 STRL IVOR (GLOVE) ×6 IMPLANT
GOWN STRL REUS W/ TWL LRG LVL3 (GOWN DISPOSABLE) ×2 IMPLANT
GOWN STRL REUS W/ TWL XL LVL3 (GOWN DISPOSABLE) ×1 IMPLANT
GOWN STRL REUS W/TWL LRG LVL3 (GOWN DISPOSABLE) ×4
GOWN STRL REUS W/TWL XL LVL3 (GOWN DISPOSABLE) ×2
NEEDLE HYPO 25X1 1.5 SAFETY (NEEDLE) ×3 IMPLANT
NS IRRIG 1000ML POUR BTL (IV SOLUTION) ×3 IMPLANT
PACK BASIN DAY SURGERY FS (CUSTOM PROCEDURE TRAY) ×3 IMPLANT
PENCIL BUTTON HOLSTER BLD 10FT (ELECTRODE) ×3 IMPLANT
SLEEVE SCD COMPRESS KNEE MED (MISCELLANEOUS) ×3 IMPLANT
STRIP CLOSURE SKIN 1/2X4 (GAUZE/BANDAGES/DRESSINGS) ×2 IMPLANT
SUT MNCRL AB 4-0 PS2 18 (SUTURE) ×3 IMPLANT
SUT VIC AB 3-0 SH 27 (SUTURE) ×2
SUT VIC AB 3-0 SH 27X BRD (SUTURE) ×1 IMPLANT
SUT VICRYL 4-0 PS2 18IN ABS (SUTURE) ×3 IMPLANT
SYR CONTROL 10ML LL (SYRINGE) ×3 IMPLANT
TOWEL OR 17X24 6PK STRL BLUE (TOWEL DISPOSABLE) ×3 IMPLANT
TOWEL OR NON WOVEN STRL DISP B (DISPOSABLE) ×3 IMPLANT
TUBE CONNECTING 20'X1/4 (TUBING)
TUBE CONNECTING 20X1/4 (TUBING) IMPLANT
YANKAUER SUCT BULB TIP NO VENT (SUCTIONS) IMPLANT

## 2013-03-18 NOTE — Brief Op Note (Signed)
03/18/2013  12:09 PM  PATIENT:  Deanna Gross  28 y.o. female  PRE-OPERATIVE DIAGNOSIS:  epidermal cyst with history of abscess and cellulitis  POST-OPERATIVE DIAGNOSIS:  epidermal cyst with history of abscess and cellulitis  PROCEDURE:  Procedure(s): EXCISE CYST LEFT MEDIAL BREAST (Left)  SURGEON:  Surgeon(s) and Role:    * Velora Hecklerodd M Ernesto Zukowski, MD - Primary  ANESTHESIA:   general  EBL:  Total I/O In: 700 [I.V.:700] Out: -   BLOOD ADMINISTERED:none  DRAINS: none   LOCAL MEDICATIONS USED:  MARCAINE     SPECIMEN:  Excision  DISPOSITION OF SPECIMEN:  PATHOLOGY  COUNTS:  YES  TOURNIQUET:  * No tourniquets in log *  DICTATION: .Other Dictation: Dictation Number [id number]  PLAN OF CARE: Discharge to home after PACU  PATIENT DISPOSITION:  PACU - hemodynamically stable.   Delay start of Pharmacological VTE agent (>24hrs) due to surgical blood loss or risk of bleeding: yes  Velora Hecklerodd M. Lovie Agresta, MD, [facility] Office: [phone]

## 2013-03-18 NOTE — Anesthesia Preprocedure Evaluation (Signed)
Anesthesia Evaluation  Patient identified by MRN, date of birth, ID band Patient awake    Reviewed: Allergy & Precautions, H&P , NPO status , Patient's Chart, lab work & pertinent test results  Airway Mallampati: II TM Distance: >3 FB Neck ROM: Full    Dental  (+) Teeth Intact, Dental Advisory Given   Pulmonary  breath sounds clear to auscultation        Cardiovascular Rhythm:Regular Rate:Normal     Neuro/Psych    GI/Hepatic   Endo/Other    Renal/GU      Musculoskeletal   Abdominal   Peds  Hematology   Anesthesia Other Findings   Reproductive/Obstetrics                           Anesthesia Physical Anesthesia Plan  ASA: II  Anesthesia Plan: General   Post-op Pain Management:    Induction: Intravenous  Airway Management Planned: LMA  Additional Equipment:   Intra-op Plan:   Post-operative Plan: Extubation in OR  Informed Consent: I have reviewed the patients History and Physical, chart, labs and discussed the procedure including the risks, benefits and alternatives for the proposed anesthesia with the patient or authorized representative who has indicated his/her understanding and acceptance.   Dental advisory given  Plan Discussed with: CRNA and Anesthesiologist  Anesthesia Plan Comments: (L. Breast Ca Mild asthma  Plan GA with LMA  Kipp Broodavid Ajia Chadderdon, MD)        Anesthesia Quick Evaluation

## 2013-03-18 NOTE — Anesthesia Postprocedure Evaluation (Signed)
  Anesthesia Post-op Note  Patient: Deanna Gross L Page  Procedure(s) Performed: Procedure(s): EXCISE CYST LEFT MEDIAL BREAST (Left)  Patient Location: PACU  Anesthesia Type:General  Level of Consciousness: awake, alert  and oriented  Airway and Oxygen Therapy: Patient Spontanous Breathing  Post-op Pain: mild  Post-op Assessment: Post-op Vital signs reviewed, Patient's Cardiovascular Status Stable, Respiratory Function Stable, Patent Airway and Pain level controlled  Post-op Vital Signs: stable  Complications: No apparent anesthesia complications

## 2013-03-18 NOTE — Anesthesia Procedure Notes (Signed)
Procedure Name: LMA Insertion Date/Time: 03/18/2013 11:33 AM Performed by: Gar GibbonKEETON, Earlin Sweeden S Pre-anesthesia Checklist: Patient identified, Emergency Drugs available, Suction available and Patient being monitored Patient Re-evaluated:Patient Re-evaluated prior to inductionOxygen Delivery Method: Circle System Utilized Preoxygenation: Pre-oxygenation with 100% oxygen Intubation Type: IV induction Ventilation: Mask ventilation without difficulty LMA: LMA inserted LMA Size: 4.0 Number of attempts: 1 Airway Equipment and Method: bite block Placement Confirmation: positive ETCO2 Tube secured with: Tape Dental Injury: Teeth and Oropharynx as per pre-operative assessment

## 2013-03-18 NOTE — Interval H&P Note (Signed)
History and Physical Interval Note:  03/18/2013 11:10 AM  Deanna Gross  has presented today for surgery, with the diagnosis of epidermal cyst with history of abscess and cellulitis.  The various methods of treatment have been discussed with the patient and family. After consideration of risks, benefits and other options for treatment, the patient has consented to    Procedure(s): EXCISE CYST LEFT MEDIAL BREAST (Left) as a surgical intervention .    The patient's history has been reviewed, patient examined, no change in status, stable for surgery.  I have reviewed the patient's chart and labs.  Questions were answered to the patient's satisfaction.    Velora Hecklerodd M. Fotini Lemus, MD, [facility] Office: [phone]    [name]

## 2013-03-18 NOTE — Discharge Instructions (Signed)

## 2013-03-18 NOTE — H&P (View-Only) (Signed)
General Surgery [facility]  Chief Complaint  Patient presents with  . Wound Check    inflammed cyst left breast    HISTORY: The patient is a 28 year old female been managed for an abscessed cyst at the medial aspect of the left breast. She has completed her course of oral antibiotics. Packing was removed from the wound last week. She has been using an antibacterial soap to bathe. She has been doing warm compresses. She returns today for wound check.  EXAM: The wound has now closed and is mostly epithelialized. There is no drainage. There is no fluctuance. There is mild induration and mild tenderness. There is no sign of cellulitis.  IMPRESSION: Resolving abscess and cellulitis left medial breast  PLAN: Patient will continue local wound care with triple antibiotic ointment and will use antibacterial soap to bathe. We will schedule her for surgical excision of this lesion in approximately 3 weeks. That can be performed under anesthesia as an outpatient procedure. We discussed primary closure versus the potential need to leave the wound open and allow it to heal by secondary intention. She understands.  The risks and benefits of the procedure have been discussed at length with the patient.  The patient understands the proposed procedure, potential alternative treatments, and the course of recovery to be expected.  All of the patient's questions have been answered at this time.  The patient wishes to proceed with surgery.   Velora Hecklerodd M. Sufyaan Palma, MD, FACS General & Endocrine Surgery [facility]   Visit Diagnoses: 1. Cellulitis and abscess of trunk

## 2013-03-18 NOTE — Transfer of Care (Signed)
Immediate Anesthesia Transfer of Care Note  Patient: Deanna Gross  Procedure(s) Performed: Procedure(s): EXCISE CYST LEFT MEDIAL BREAST (Left)  Patient Location: PACU  Anesthesia Type:General  Level of Consciousness: sedated and patient cooperative  Airway & Oxygen Therapy: Patient Spontanous Breathing and Patient connected to face mask oxygen  Post-op Assessment: Report given to PACU RN and Post -op Vital signs reviewed and stable  Post vital signs: Reviewed and stable  Complications: No apparent anesthesia complications

## 2013-03-21 ENCOUNTER — Encounter (HOSPITAL_BASED_OUTPATIENT_CLINIC_OR_DEPARTMENT_OTHER): Payer: Self-pay | Admitting: Surgery

## 2013-03-21 NOTE — Op Note (Signed)
NAME:  Deanna Gross, Deanna Gross                 ACCOUNT NO.:  [id number]  MEDICAL RECORD NO.:  [id number]  LOCATION:                               FACILITY:  [facility]  PHYSICIAN:  Velora Hecklerodd M Ramani Riva, MD      DATE OF BIRTH:  1985/05/21  DATE OF PROCEDURE:  03/18/2013                               OPERATIVE REPORT   PREOPERATIVE DIAGNOSIS:  History of abscess and cellulitis and epidermal cyst, left medial breast.  POSTOPERATIVE DIAGNOSIS:  History of abscess and cellulitis and epidermal cyst, left medial breast.  PROCEDURE:  Excision epidermal cyst, left medial breast.  SURGEON:  Velora Hecklerodd M Nekeshia Lenhardt, MD, FACS  ANESTHESIA:  General.  ESTIMATED BLOOD LOSS:  Minimal.  PREPARATION:  ChloraPrep.  COMPLICATIONS:  None.  INDICATIONS:  The patient is a 28 year old female, who had experienced abscess and cellulitis at the left medial breast.  She underwent incision and drainage with open packing.  She was treated with antibiotics.  After a relatively complicated course, the wound has healed and epithelialized.  She now comes to Surgery for definitive excision to prevent recurrence.  BODY OF REPORT:  Procedure [facility].  The patient was brought to the operating room, placed in supine position on the operating room table.  Following administration of general anesthesia, the patient was positioned and then prepped and draped in the usual aseptic fashion.  After ascertaining that an adequate level of anesthesia had been achieved, an elliptical incision was made on the medial left breast so as to encompass the previous drainage site.  Dissection was carried through the skin and into the subcutaneous tissues.  Underlying tissue was excised down to the chest wall.  No residual infection or purulent fluid or necrotic tissue was identified.  Specimen was submitted to Pathology for review.  Good hemostasis was achieved with electrocautery.  Subcutaneous tissues were  anesthetized with local anesthetic.  Subcutaneous tissues were closed with interrupted 3-0 Vicryl sutures.  Skin edges were reapproximated with interrupted 4-0 Monocryl subcuticular sutures.  Skin edges were anesthetized with local anesthetic.  Wound was washed and dried and benzoin and Steri-Strips were applied.  Sterile dressings were applied. The patient was awakened from anesthesia and brought to the recovery room.  The patient tolerated the procedure well.   Velora Hecklerodd M. Gray Doering, MD, [facility] Office: [phone]    TMG/MEDQ  D:  03/18/2013  T:  03/19/2013  Job:  [id number]

## 2013-03-23 ENCOUNTER — Telehealth (INDEPENDENT_AMBULATORY_CARE_PROVIDER_SITE_OTHER): Payer: Self-pay

## 2013-03-23 NOTE — Telephone Encounter (Signed)
Pt notified of path result. 

## 2013-04-05 ENCOUNTER — Encounter (INDEPENDENT_AMBULATORY_CARE_PROVIDER_SITE_OTHER): Payer: Self-pay | Admitting: Surgery

## 2013-04-05 ENCOUNTER — Ambulatory Visit (INDEPENDENT_AMBULATORY_CARE_PROVIDER_SITE_OTHER): Payer: Commercial Managed Care - PPO | Admitting: Surgery

## 2013-04-05 VITALS — BP 124/78 | HR 78 | Temp 98.4°F | Resp 14 | Ht 62.5 in | Wt 223.2 lb

## 2013-04-05 DIAGNOSIS — L03319 Cellulitis of trunk, unspecified: Principal | ICD-10-CM

## 2013-04-05 DIAGNOSIS — L02219 Cutaneous abscess of trunk, unspecified: Secondary | ICD-10-CM

## 2013-04-05 NOTE — Patient Instructions (Signed)
  COCOA BUTTER & VITAMIN E CREAM  (Palmer's or other brand)  Apply cocoa butter/vitamin E cream to your incision 2 - 3 times daily.  Massage cream into incision for one minute with each application.  Use sunscreen (50 SPF or higher) for first 6 months after surgery if area is exposed to sun.  You may substitute Mederma or other scar reducing creams as desired.   

## 2013-04-05 NOTE — Progress Notes (Signed)
General Surgery [facility]  Chief Complaint  Patient presents with  . Routine Post Op    excision cyst from left breast - 03/18/2013    HISTORY: The patient is a 28 year old female who underwent excision of a subcutaneous cyst in the medial left breast on 03/18/2013. Final pathology is benign.  EXAM: Wound has healed nicely. No sign of infection. No significant seroma.  IMPRESSION: Status post excision of epidermal inclusion cyst from medial left breast following abscess  PLAN: Patient will apply topical creams to her incision. She is released to full activity without restriction.  Patient will return for surgical care as needed.  Velora Hecklerodd M. Juline Sanderford, MD, FACS General & Endocrine Surgery [facility]   Visit Diagnoses: 1. Cellulitis and abscess of trunk

## 2013-04-13 ENCOUNTER — Emergency Department: Payer: Self-pay | Admitting: Internal Medicine

## 2013-04-19 LAB — HEPATITIS B SURFACE ANTIBODY, QUANTITATIVE: Hepatitis B Surf Ab Quant: 48.2

## 2013-05-22 ENCOUNTER — Telehealth (INDEPENDENT_AMBULATORY_CARE_PROVIDER_SITE_OTHER): Payer: Self-pay | Admitting: Surgery

## 2013-05-22 NOTE — Telephone Encounter (Signed)
Ms. Deanna Gross had a cyst excise on 03/18/2013 by Dr. Gerrit FriendsGerkin.  The area was red for a couple of days, then last opened and drained.  She has no fever, but she can see a "hole" in the scar where it drained.  She will keep the area clean and contact our office tomorrow, 4/27, to be seen.  Ovidio Kinavid Franceska Strahm, MD, [facility] Pager: [phone] Office phone:  [phone]

## 2013-05-24 ENCOUNTER — Ambulatory Visit (INDEPENDENT_AMBULATORY_CARE_PROVIDER_SITE_OTHER): Payer: Commercial Managed Care - PPO | Admitting: Surgery

## 2013-05-24 ENCOUNTER — Encounter (INDEPENDENT_AMBULATORY_CARE_PROVIDER_SITE_OTHER): Payer: Self-pay | Admitting: Surgery

## 2013-05-24 ENCOUNTER — Encounter (INDEPENDENT_AMBULATORY_CARE_PROVIDER_SITE_OTHER): Payer: Self-pay

## 2013-05-24 VITALS — BP 132/78 | HR 78 | Temp 97.3°F | Resp 16 | Ht 63.0 in | Wt 214.8 lb

## 2013-05-24 DIAGNOSIS — IMO0002 Reserved for concepts with insufficient information to code with codable children: Secondary | ICD-10-CM

## 2013-05-24 NOTE — Progress Notes (Signed)
URGENT Office Fortunata L Page 28 y.o.  Body mass index is 38.06 kg/(m^2).  Patient Active Problem List   Diagnosis Date Noted  . Cellulitis aJill Sidend abscess of trunk 02/10/2013    Allergies  Allergen Reactions  . Polytrim [Polymyxin B-Trimethoprim]     Past Surgical History  Procedure Laterality Date  . Tonsillectomy and adenoidectomy    . Myringotomy    . Wisdom tooth extraction    . Cyst excision    . Tonsillectomy    . Breast cyst excision Left 03/18/2013    Procedure: EXCISE CYST LEFT MEDIAL BREAST;  Surgeon: Velora Hecklerodd M Gerkin, MD;  Location: [facility];  Service: General;  Laterality: Left;   PROVIDER NOT IN SYSTEM No diagnosis found.  Patient had infected sebaceous cyst excised by Dr. Gerrit FriendsGerkin on February 20.  She had done well until a couple of days ago when she drained some serum onto her shirt.  [facility] and was placed on Bactrim.   I see no evidence of ongoing infection-the small incision is near the midline and has opened partially and drained.  No redness or tenderness Recommend daily shower, hair dryer and application of Neosporin.  Will followup with Dr. Gerrit FriendsGerkin if no better in a couple of weeks.   Matt B. Daphine DeutscherMartin, MD, [facility]  [facility] [phone] beeper [phone]  05/24/2013 3:36 PM

## 2013-06-16 ENCOUNTER — Telehealth (INDEPENDENT_AMBULATORY_CARE_PROVIDER_SITE_OTHER): Payer: Self-pay

## 2013-06-16 NOTE — Telephone Encounter (Signed)
Pt called stating area at thyroid incision as opened again and small amount of dark blood drained. No redness. No fever. I advised pt she needs to come to office this afternoon for wd to be checked. Pt states she has to work and cannot come. Pt offered appt next week with Dr Gerrit FriendsGerkin. Pt declined appt. I advised pt our MD will need to see the wound to determine what treatment pt will need. Pt states when she came in before she was told to just use antibiotic ointment. I again advised pt we could see her today or next week with Dr Gerrit FriendsGerkin. Pt still declines appts offered. Pt states she will call if area becomes red,inflamed or does not re-close.

## 2013-06-21 ENCOUNTER — Encounter (INDEPENDENT_AMBULATORY_CARE_PROVIDER_SITE_OTHER): Payer: Self-pay | Admitting: Surgery

## 2013-06-21 ENCOUNTER — Ambulatory Visit (INDEPENDENT_AMBULATORY_CARE_PROVIDER_SITE_OTHER): Payer: Commercial Managed Care - PPO | Admitting: Surgery

## 2013-06-21 VITALS — BP 102/80 | HR 70 | Temp 97.0°F | Resp 20 | Ht 63.0 in | Wt 213.0 lb

## 2013-06-21 DIAGNOSIS — T07XXXA Unspecified multiple injuries, initial encounter: Secondary | ICD-10-CM

## 2013-06-21 DIAGNOSIS — T148XXD Other injury of unspecified body region, subsequent encounter: Secondary | ICD-10-CM

## 2013-06-21 NOTE — Patient Instructions (Signed)
Apply triple antibiotic ointment to wound 2-3 times daily.  Shower with soap and water once daily.  Velora Heckler, MD, [facility] Office: [phone]

## 2013-06-21 NOTE — Progress Notes (Signed)
General Surgery [facility]  Chief Complaint  Patient presents with  . Wound Check    left breast wound with delayed healing    HISTORY: Patient underwent excision of cyst from medial left breast several months ago. She has had intermittent drainage from the incision. She returns today for wound check.  EXAM: Wound appears completely epithelialized. There is a small central area which appears fragile and is opened with a Q-tip. There is no significant drainage. There is no sign of cellulitis. The area is cauterized with silver nitrate and antibiotic ointment and a Band-Aid is placed as dressing.  IMPRESSION: Delayed wound healing, medial left breast  PLAN: Instructions for wound care given. Patient will return in approximately 3 weeks for wound check and repeat application of silver nitrate if necessary.  Velora Heckler, MD, FACS General & Endocrine Surgery [facility]   Visit Diagnoses: 1. Wound healing, delayed

## 2013-07-13 ENCOUNTER — Encounter (INDEPENDENT_AMBULATORY_CARE_PROVIDER_SITE_OTHER): Payer: Commercial Managed Care - PPO | Admitting: Surgery

## 2013-08-10 ENCOUNTER — Ambulatory Visit (INDEPENDENT_AMBULATORY_CARE_PROVIDER_SITE_OTHER): Payer: Commercial Managed Care - PPO | Admitting: Surgery

## 2013-08-10 ENCOUNTER — Encounter (INDEPENDENT_AMBULATORY_CARE_PROVIDER_SITE_OTHER): Payer: Self-pay | Admitting: Surgery

## 2013-08-10 VITALS — BP 122/72 | HR 56 | Temp 98.0°F | Resp 18 | Ht 63.0 in | Wt 210.0 lb

## 2013-08-10 DIAGNOSIS — T07XXXA Unspecified multiple injuries, initial encounter: Secondary | ICD-10-CM

## 2013-08-10 DIAGNOSIS — T148XXD Other injury of unspecified body region, subsequent encounter: Secondary | ICD-10-CM

## 2013-08-10 MED ORDER — DOXYCYCLINE HYCLATE 100 MG PO TABS: 100.0000 mg | ORAL_TABLET | Freq: Two times a day (BID) | ORAL | Status: DC

## 2013-08-10 NOTE — Progress Notes (Signed)
General Surgery [facility]  Chief Complaint  Patient presents with  . Follow-up    left breast surgical site - delayed wound healing    HISTORY: The patient is a 28 year old female who underwent excision of a cyst from the medial left breast. Postoperatively she had breakdown of the wound with drainage. This has become a chronic problem. She has been treated with antibiotics and local wound care. The wound continues to become intermittently inflamed and opened at the midportion of the surgical scar with drainage. She returns today for evaluation. Patient has taken photographs of the process and has them on herself today.  PERTINENT REVIEW OF SYSTEMS: Intermittent pain, redness, and drainage at site of previous excision of cyst from left medial breast  EXAM: HEENT: normocephalic; pupils equal and reactive; sclerae clear; dentition good; mucous membranes moist BREAST: Surgical scar medial left breast with a 3 mm ulcerated opening in the midportion, serosanguineous drainage, mild underlying induration with tenderness CHEST: clear to auscultation bilaterally without rales, rhonchi, or wheezes CARDIAC: regular rate and rhythm without significant murmur; peripheral pulses are full EXT:  non-tender without edema; no deformity NEURO: no gross focal deficits; no sign of tremor   IMPRESSION: Nonhealing surgical wound medial left breast, possible variant of hidradenitis  PLAN: Patient and I discussed the above findings. We discussed her clinical course. I have recommended that we treat her with a 10 day course of doxycycline immediately prior to re-excision and closure of the area. She agrees. We will schedule this as an outpatient surgical procedure at a time convenient for the patient in the near future.  The risks and benefits of the procedure have been discussed at length with the patient.  The patient understands the proposed procedure, potential alternative treatments,  and the course of recovery to be expected.  All of the patient's questions have been answered at this time.  The patient wishes to proceed with surgery.  Velora Hecklerodd M. Marcena Dias, MD, [facility] Office: [phone]  Visit Diagnoses: 1. Wound healing, delayed

## 2013-08-27 DIAGNOSIS — T8189XA Other complications of procedures, not elsewhere classified, initial encounter: Secondary | ICD-10-CM

## 2013-08-27 HISTORY — DX: Other complications of procedures, not elsewhere classified, initial encounter: T81.89XA

## 2013-08-30 ENCOUNTER — Encounter (HOSPITAL_BASED_OUTPATIENT_CLINIC_OR_DEPARTMENT_OTHER): Payer: Self-pay | Admitting: *Deleted

## 2013-08-30 DIAGNOSIS — W57XXXA Bitten or stung by nonvenomous insect and other nonvenomous arthropods, initial encounter: Secondary | ICD-10-CM

## 2013-08-30 HISTORY — DX: Bitten or stung by nonvenomous insect and other nonvenomous arthropods, initial encounter: W57.XXXA

## 2013-09-01 ENCOUNTER — Encounter (HOSPITAL_BASED_OUTPATIENT_CLINIC_OR_DEPARTMENT_OTHER): Payer: Self-pay | Admitting: *Deleted

## 2013-09-01 ENCOUNTER — Encounter (HOSPITAL_BASED_OUTPATIENT_CLINIC_OR_DEPARTMENT_OTHER)
Admission: RE | Disposition: A | Discharge status: Home / Self Care | Payer: Self-pay | Source: Ambulatory Visit | Attending: Surgery

## 2013-09-01 ENCOUNTER — Encounter (HOSPITAL_BASED_OUTPATIENT_CLINIC_OR_DEPARTMENT_OTHER): Payer: 59 | Admitting: Certified Registered"

## 2013-09-01 ENCOUNTER — Ambulatory Visit (HOSPITAL_BASED_OUTPATIENT_CLINIC_OR_DEPARTMENT_OTHER): Payer: 59 | Admitting: Certified Registered"

## 2013-09-01 ENCOUNTER — Ambulatory Visit (HOSPITAL_BASED_OUTPATIENT_CLINIC_OR_DEPARTMENT_OTHER)
Admission: RE | Admit: 2013-09-01 | Discharge: 2013-09-01 | Disposition: A | Discharge status: Home / Self Care | Payer: 59 | Source: Ambulatory Visit | Attending: Surgery | Admitting: Surgery

## 2013-09-01 DIAGNOSIS — L905 Scar conditions and fibrosis of skin: Secondary | ICD-10-CM | POA: Insufficient documentation

## 2013-09-01 DIAGNOSIS — L02219 Cutaneous abscess of trunk, unspecified: Secondary | ICD-10-CM | POA: Diagnosis present

## 2013-09-01 DIAGNOSIS — T8189XA Other complications of procedures, not elsewhere classified, initial encounter: Secondary | ICD-10-CM

## 2013-09-01 DIAGNOSIS — M799 Soft tissue disorder, unspecified: Secondary | ICD-10-CM | POA: Insufficient documentation

## 2013-09-01 DIAGNOSIS — L03319 Cellulitis of trunk, unspecified: Secondary | ICD-10-CM

## 2013-09-01 DIAGNOSIS — Y838 Other surgical procedures as the cause of abnormal reaction of the patient, or of later complication, without mention of misadventure at the time of the procedure: Secondary | ICD-10-CM | POA: Insufficient documentation

## 2013-09-01 DIAGNOSIS — T148XXD Other injury of unspecified body region, subsequent encounter: Secondary | ICD-10-CM

## 2013-09-01 HISTORY — PX: MASS EXCISION: SHX2000

## 2013-09-01 HISTORY — DX: Bitten or stung by nonvenomous insect and other nonvenomous arthropods, initial encounter: W57.XXXA

## 2013-09-01 HISTORY — DX: Other seasonal allergic rhinitis: J30.2

## 2013-09-01 HISTORY — DX: Personal history of Methicillin resistant Staphylococcus aureus infection: Z86.14

## 2013-09-01 HISTORY — DX: Other complications of procedures, not elsewhere classified, initial encounter: T81.89XA

## 2013-09-01 LAB — POCT HEMOGLOBIN-HEMACUE: HEMOGLOBIN: 13.9 g/dL (ref 12.0–15.0)

## 2013-09-01 SURGERY — EXCISION MASS
Anesthesia: General | Site: Breast | Laterality: Left

## 2013-09-01 MED ORDER — FENTANYL CITRATE 0.05 MG/ML IJ SOLN
INTRAMUSCULAR | Status: DC | PRN
  Administered 2013-09-01: 25 ug via INTRAVENOUS
  Administered 2013-09-01: 100 ug via INTRAVENOUS

## 2013-09-01 MED ORDER — HYDROMORPHONE HCL PF 1 MG/ML IJ SOLN
INTRAMUSCULAR | Status: AC
  Filled 2013-09-01: qty 1

## 2013-09-01 MED ORDER — OXYCODONE HCL 5 MG PO TABS
ORAL_TABLET | ORAL | Status: AC
  Filled 2013-09-01: qty 1

## 2013-09-01 MED ORDER — OXYCODONE HCL 5 MG/5ML PO SOLN: 5.0000 mg | Freq: Once | ORAL | Status: AC | PRN

## 2013-09-01 MED ORDER — LIDOCAINE HCL (PF) 1 % IJ SOLN
INTRAMUSCULAR | Status: AC
  Filled 2013-09-01: qty 30

## 2013-09-01 MED ORDER — MEPERIDINE HCL 25 MG/ML IJ SOLN: 6.2500 mg | INTRAMUSCULAR | Status: DC | PRN

## 2013-09-01 MED ORDER — HYDROMORPHONE HCL PF 1 MG/ML IJ SOLN
0.2500 mg | INTRAMUSCULAR | Status: DC | PRN
  Administered 2013-09-01 (×3): 0.5 mg via INTRAVENOUS

## 2013-09-01 MED ORDER — MIDAZOLAM HCL 5 MG/5ML IJ SOLN
INTRAMUSCULAR | Status: DC | PRN
  Administered 2013-09-01: 2 mg via INTRAVENOUS

## 2013-09-01 MED ORDER — LIDOCAINE HCL (CARDIAC) 20 MG/ML IV SOLN
INTRAVENOUS | Status: DC | PRN
  Administered 2013-09-01: 60 mg via INTRAVENOUS

## 2013-09-01 MED ORDER — HYDROCODONE-ACETAMINOPHEN 5-325 MG PO TABS: 1.0000 | ORAL_TABLET | ORAL | Status: DC | PRN

## 2013-09-01 MED ORDER — MIDAZOLAM HCL 2 MG/2ML IJ SOLN
INTRAMUSCULAR | Status: AC
  Filled 2013-09-01: qty 2

## 2013-09-01 MED ORDER — CEFAZOLIN SODIUM-DEXTROSE 2-3 GM-% IV SOLR
INTRAVENOUS | Status: AC
  Filled 2013-09-01: qty 50

## 2013-09-01 MED ORDER — BUPIVACAINE HCL (PF) 0.5 % IJ SOLN
INTRAMUSCULAR | Status: DC | PRN
  Administered 2013-09-01: 8 mL

## 2013-09-01 MED ORDER — ONDANSETRON HCL 4 MG/2ML IJ SOLN: 4.0000 mg | Freq: Once | INTRAMUSCULAR | Status: DC | PRN

## 2013-09-01 MED ORDER — OXYCODONE HCL 5 MG PO TABS
5.0000 mg | ORAL_TABLET | Freq: Once | ORAL | Status: AC | PRN
  Administered 2013-09-01: 5 mg via ORAL

## 2013-09-01 MED ORDER — MIDAZOLAM HCL 2 MG/2ML IJ SOLN: 1.0000 mg | INTRAMUSCULAR | Status: DC | PRN

## 2013-09-01 MED ORDER — MIDAZOLAM HCL 2 MG/ML PO SYRP: 12.0000 mg | ORAL_SOLUTION | Freq: Once | ORAL | Status: DC | PRN

## 2013-09-01 MED ORDER — LACTATED RINGERS IV SOLN
INTRAVENOUS | Status: DC
  Administered 2013-09-01 (×2): via INTRAVENOUS

## 2013-09-01 MED ORDER — CEFAZOLIN SODIUM-DEXTROSE 2-3 GM-% IV SOLR
2.0000 g | INTRAVENOUS | Status: AC
  Administered 2013-09-01: 2 g via INTRAVENOUS

## 2013-09-01 MED ORDER — FENTANYL CITRATE 0.05 MG/ML IJ SOLN: 50.0000 ug | INTRAMUSCULAR | Status: DC | PRN

## 2013-09-01 MED ORDER — FENTANYL CITRATE 0.05 MG/ML IJ SOLN
INTRAMUSCULAR | Status: AC
  Filled 2013-09-01: qty 6

## 2013-09-01 MED ORDER — BUPIVACAINE HCL (PF) 0.5 % IJ SOLN
INTRAMUSCULAR | Status: AC
  Filled 2013-09-01: qty 30

## 2013-09-01 MED ORDER — ONDANSETRON HCL 4 MG/2ML IJ SOLN
INTRAMUSCULAR | Status: DC | PRN
  Administered 2013-09-01: 4 mg via INTRAVENOUS

## 2013-09-01 MED ORDER — PROPOFOL 10 MG/ML IV BOLUS
INTRAVENOUS | Status: DC | PRN
  Administered 2013-09-01: 150 mg via INTRAVENOUS

## 2013-09-01 MED ORDER — DEXAMETHASONE SODIUM PHOSPHATE 4 MG/ML IJ SOLN
INTRAMUSCULAR | Status: DC | PRN
  Administered 2013-09-01: 10 mg via INTRAVENOUS

## 2013-09-01 SURGICAL SUPPLY — 37 items
BENZOIN TINCTURE PRP APPL 2/3 (GAUZE/BANDAGES/DRESSINGS) ×3 IMPLANT
BLADE SURG 15 STRL LF DISP TIS (BLADE) ×1 IMPLANT
BLADE SURG 15 STRL SS (BLADE) ×2
CHLORAPREP W/TINT 26ML (MISCELLANEOUS) ×3 IMPLANT
CLEANER CAUTERY TIP 5X5 PAD (MISCELLANEOUS) IMPLANT
CLOSURE WOUND 1/2 X4 (GAUZE/BANDAGES/DRESSINGS) ×1
COVER MAYO STAND STRL (DRAPES) ×3 IMPLANT
COVER TABLE BACK 60X90 (DRAPES) ×3 IMPLANT
DECANTER SPIKE VIAL GLASS SM (MISCELLANEOUS) IMPLANT
DRAPE PED LAPAROTOMY (DRAPES) ×3 IMPLANT
DRAPE UTILITY XL STRL (DRAPES) ×3 IMPLANT
DRSG TEGADERM 4X4.75 (GAUZE/BANDAGES/DRESSINGS) ×3 IMPLANT
ELECT REM PT RETURN 9FT ADLT (ELECTROSURGICAL) ×3
ELECTRODE REM PT RTRN 9FT ADLT (ELECTROSURGICAL) ×1 IMPLANT
GLOVE BIOGEL PI IND STRL 7.0 (GLOVE) ×1 IMPLANT
GLOVE BIOGEL PI INDICATOR 7.0 (GLOVE) ×2
GLOVE ECLIPSE 6.5 STRL STRAW (GLOVE) ×3 IMPLANT
GLOVE EXAM NITRILE MD LF STRL (GLOVE) ×3 IMPLANT
GLOVE SURG ORTHO 8.0 STRL STRW (GLOVE) ×3 IMPLANT
GOWN STRL REUS W/ TWL LRG LVL3 (GOWN DISPOSABLE) ×1 IMPLANT
GOWN STRL REUS W/ TWL XL LVL3 (GOWN DISPOSABLE) ×1 IMPLANT
GOWN STRL REUS W/TWL LRG LVL3 (GOWN DISPOSABLE) ×2
GOWN STRL REUS W/TWL XL LVL3 (GOWN DISPOSABLE) ×2
NEEDLE HYPO 25X1 1.5 SAFETY (NEEDLE) ×3 IMPLANT
PACK BASIN DAY SURGERY FS (CUSTOM PROCEDURE TRAY) ×3 IMPLANT
PAD CLEANER CAUTERY TIP 5X5 (MISCELLANEOUS)
PENCIL BUTTON HOLSTER BLD 10FT (ELECTRODE) ×3 IMPLANT
SHEET MEDIUM DRAPE 40X70 STRL (DRAPES) IMPLANT
SPONGE GAUZE 4X4 12PLY STER LF (GAUZE/BANDAGES/DRESSINGS) ×3 IMPLANT
STRIP CLOSURE SKIN 1/2X4 (GAUZE/BANDAGES/DRESSINGS) ×2 IMPLANT
SUT ETHILON 3 0 PS 1 (SUTURE) IMPLANT
SUT MNCRL AB 4-0 PS2 18 (SUTURE) ×3 IMPLANT
SUT VICRYL 3-0 CR8 SH (SUTURE) ×3 IMPLANT
SUT VICRYL 4-0 PS2 18IN ABS (SUTURE) IMPLANT
SYR CONTROL 10ML LL (SYRINGE) ×3 IMPLANT
TOWEL OR 17X24 6PK STRL BLUE (TOWEL DISPOSABLE) ×3 IMPLANT
TOWEL OR NON WOVEN STRL DISP B (DISPOSABLE) ×3 IMPLANT

## 2013-09-01 NOTE — H&P (View-Only) (Signed)
General Surgery [facility]  Chief Complaint  Patient presents with  . Follow-up    left breast surgical site - delayed wound healing    HISTORY: The patient is a 28 year old female who underwent excision of a cyst from the medial left breast. Postoperatively she had breakdown of the wound with drainage. This has become a chronic problem. She has been treated with antibiotics and local wound care. The wound continues to become intermittently inflamed and opened at the midportion of the surgical scar with drainage. She returns today for evaluation. Patient has taken photographs of the process and has them on herself today.  PERTINENT REVIEW OF SYSTEMS: Intermittent pain, redness, and drainage at site of previous excision of cyst from left medial breast  EXAM: HEENT: normocephalic; pupils equal and reactive; sclerae clear; dentition good; mucous membranes moist BREAST: Surgical scar medial left breast with a 3 mm ulcerated opening in the midportion, serosanguineous drainage, mild underlying induration with tenderness CHEST: clear to auscultation bilaterally without rales, rhonchi, or wheezes CARDIAC: regular rate and rhythm without significant murmur; peripheral pulses are full EXT:  non-tender without edema; no deformity NEURO: no gross focal deficits; no sign of tremor   IMPRESSION: Nonhealing surgical wound medial left breast, possible variant of hidradenitis  PLAN: Patient and I discussed the above findings. We discussed her clinical course. I have recommended that we treat her with a 10 day course of doxycycline immediately prior to re-excision and closure of the area. She agrees. We will schedule this as an outpatient surgical procedure at a time convenient for the patient in the near future.  The risks and benefits of the procedure have been discussed at length with the patient.  The patient understands the proposed procedure, potential alternative treatments,  and the course of recovery to be expected.  All of the patient's questions have been answered at this time.  The patient wishes to proceed with surgery.  Velora Hecklerodd M. Jarrell Armond, MD, [facility] Office: [phone]  Visit Diagnoses: 1. Wound healing, delayed

## 2013-09-01 NOTE — Discharge Instructions (Signed)

## 2013-09-01 NOTE — Anesthesia Preprocedure Evaluation (Signed)

## 2013-09-01 NOTE — Anesthesia Postprocedure Evaluation (Signed)
Anesthesia Post Note  Patient: Deanna Gross  Procedure(s) Performed: Procedure(s) (LRB): RE-EXCISION NON HEALING WOUND LEFT BREAST (Left)  Anesthesia type: general  Patient location: PACU  Post pain: Pain level controlled  Post assessment: Patient's Cardiovascular Status Stable  Last Vitals:  Filed Vitals:   09/01/13 1400  BP: 114/68  Pulse: 72  Temp:   Resp: 17    Post vital signs: Reviewed and stable  Level of consciousness: sedated  Complications: No apparent anesthesia complications

## 2013-09-01 NOTE — Brief Op Note (Signed)
09/01/2013  2:00 PM  PATIENT:  Deanna Gross  28 y.o. female  PRE-OPERATIVE DIAGNOSIS:  Non-healing wound left breast  POST-OPERATIVE DIAGNOSIS:  Non-healing wound left breast  PROCEDURE:  Procedure(s): RE-EXCISION NON HEALING WOUND LEFT BREAST (Left)  SURGEON:  Surgeon(s) and Role:    * Velora Hecklerodd M Emslee Lopezmartinez, MD - Primary  ANESTHESIA:   general  EBL:  Total I/O In: 1000 [I.V.:1000] Out: -   BLOOD ADMINISTERED:none  DRAINS: none   LOCAL MEDICATIONS USED:  MARCAINE     SPECIMEN:  Excision  DISPOSITION OF SPECIMEN:  PATHOLOGY  COUNTS:  YES  TOURNIQUET:  * No tourniquets in log *  DICTATION: .Other Dictation: Dictation Number [id number]  PLAN OF CARE: Discharge to home after PACU  PATIENT DISPOSITION:  PACU - hemodynamically stable.   Delay start of Pharmacological VTE agent (>24hrs) due to surgical blood loss or risk of bleeding: yes  Velora Hecklerodd M. Torrin Crihfield, MD, [facility] Office: [phone]

## 2013-09-01 NOTE — Interval H&P Note (Signed)
History and Physical Interval Note:  09/01/2013 1:05 PM  Deanna Gross  has presented today for surgery, with the diagnosis of non healing wound left breast.  The various methods of treatment have been discussed with the patient and family. After consideration of risks, benefits and other options for treatment, the patient has consented to    Procedure(s): RE-EXCISION NON HEALING WOUND LEFT BREAST (Left) as a surgical intervention .    The patient's history has been reviewed, patient examined, no change in status, stable for surgery.  I have reviewed the patient's chart and labs.  Questions were answered to the patient's satisfaction.    Velora Hecklerodd M. Tanish Sinkler, MD, [facility] Office: [phone]    [name]

## 2013-09-01 NOTE — Transfer of Care (Signed)
Immediate Anesthesia Transfer of Care Note  Patient: Deanna Gross L Page  Procedure(s) Performed: Procedure(s): RE-EXCISION NON HEALING WOUND LEFT BREAST (Left)  Patient Location: PACU  Anesthesia Type:General  Level of Consciousness: awake and patient cooperative  Airway & Oxygen Therapy: Patient Spontanous Breathing and Patient connected to face mask oxygen  Post-op Assessment: Report given to PACU RN and Post -op Vital signs reviewed and stable  Post vital signs: Reviewed and stable  Complications: No apparent anesthesia complications

## 2013-09-01 NOTE — Anesthesia Procedure Notes (Signed)
Procedure Name: LMA Insertion Date/Time: 09/01/2013 1:17 PM Performed by: Abigayl Hor Pre-anesthesia Checklist: Patient identified, Emergency Drugs available, Suction available and Patient being monitored Patient Re-evaluated:Patient Re-evaluated prior to inductionOxygen Delivery Method: Circle System Utilized Preoxygenation: Pre-oxygenation with 100% oxygen Intubation Type: IV induction Ventilation: Mask ventilation without difficulty LMA: LMA inserted LMA Size: 4.0 Number of attempts: 1 Airway Equipment and Method: bite block Placement Confirmation: positive ETCO2 Tube secured with: Tape Dental Injury: Teeth and Oropharynx as per pre-operative assessment

## 2013-09-02 ENCOUNTER — Telehealth (INDEPENDENT_AMBULATORY_CARE_PROVIDER_SITE_OTHER): Payer: Self-pay

## 2013-09-02 ENCOUNTER — Encounter (HOSPITAL_BASED_OUTPATIENT_CLINIC_OR_DEPARTMENT_OTHER): Payer: Self-pay | Admitting: Surgery

## 2013-09-02 NOTE — Op Note (Signed)
NAME:  Deanna Gross, Deanna Gross                 ACCOUNT NO.:  [id number]  MEDICAL RECORD NO.:  [id number]  LOCATION:                                 FACILITY:  PHYSICIAN:  Velora Hecklerodd M Luvinia Lucy, MD      DATE OF BIRTH:  12-06-1985  DATE OF PROCEDURE:  09/01/2013                              OPERATIVE REPORT   PREOPERATIVE DIAGNOSIS:  Nonhealing wound, left medial breast.  POSTOPERATIVE DIAGNOSIS:  Nonhealing wound, left medial breast.  PROCEDURE:  Re-excision of nonhealing wound, left medial breast (2.5 x 3 x 1 cm).  SURGEON:  Velora Hecklerodd M Ameen Mostafa, MD, FACS  ANESTHESIA:  General.  ESTIMATED BLOOD LOSS:  Minimal.  PREPARATION:  ChloraPrep.  COMPLICATIONS:  None.  INDICATIONS:  The patient is a 28 year old female who had undergone excision of a cystic mass from the medial left breast.  Wound continues to spontaneously open and drain.  The patient has had multiple courses of antibiotics.  She has had local wound debridement.  She now comes to the operating room for re-excision and closure of a nonhealing left medial breast wound.  BODY OF REPORT:  Procedures was done [facility]5 [facility].  The patient was brought to the operating room, placed in supine position on the operating room table.  Following administration of general anesthesia, the patient was positioned and then prepped and draped in the usual aseptic fashion.  After ascertaining that an adequate level of anesthesia had been achieved, a #15 blade is used to re-excise the previous scar at the medial left breast.  Dissection was carried into the subcutaneous tissues and hemostasis achieved with electrocautery.  Tissue was excised down to the chest wall and the underlying musculature.  There was no sign of purulence.  The excised tissue measures approximately 2.5 x 3 x 1 cm in size, and it is submitted in its entirety to Pathology for review.  Wound cavity was irrigated with warm saline.  Subcutaneous tissues  were closed with interrupted 3-0 Vicryl sutures.  Skin was anesthetized with local Marcaine anesthetic.  Skin edges were reapproximated with interrupted 4- 0 Monocryl subcuticular sutures.  Wound was washed and dried and benzoin and Steri-Strips were applied.  Sterile dressings were applied.  The patient was awakened from anesthesia and brought to the recovery room. The patient tolerated the procedure well.   Velora Hecklerodd M. Kariann Wecker, MD, [facility] Office: [phone]   TMG/MEDQ  D:  09/01/2013  T:  09/01/2013  Job:  [id number]

## 2013-09-02 NOTE — Telephone Encounter (Signed)
LMOM with date of po appt. 

## 2013-09-21 ENCOUNTER — Encounter (INDEPENDENT_AMBULATORY_CARE_PROVIDER_SITE_OTHER): Payer: Self-pay | Admitting: Surgery

## 2013-09-21 ENCOUNTER — Ambulatory Visit (INDEPENDENT_AMBULATORY_CARE_PROVIDER_SITE_OTHER): Payer: Commercial Managed Care - PPO | Admitting: Surgery

## 2013-09-21 VITALS — BP 120/72 | HR 70 | Temp 98.9°F | Ht 63.0 in | Wt 208.0 lb

## 2013-09-21 DIAGNOSIS — T07XXXA Unspecified multiple injuries, initial encounter: Secondary | ICD-10-CM

## 2013-09-21 DIAGNOSIS — T148XXD Other injury of unspecified body region, subsequent encounter: Secondary | ICD-10-CM

## 2013-09-21 NOTE — Patient Instructions (Signed)
  CARE OF INCISION   Apply cocoa butter/vitamin E cream (Palmer's brand) to your incision 2 - 3 times daily.  Massage cream into incision for one minute with each application.  Use sunscreen (50 SPF or higher) for first 6 months after surgery if area is exposed to sun.  You may alternate Mederma or other scar reducing cream with cocoa butter cream if desired.       Sherlie Boyum M. Carmelo Reidel, MD, FACS      [facility]      Office: [phone]    

## 2013-09-21 NOTE — Progress Notes (Signed)
General Surgery [facility]  Chief Complaint  Patient presents with  . Routine Post Op    excision non-healing wound on 09/01/2013    HISTORY: Patient returns for postoperative visit having undergone re-excision of a nonhealing wound on 09/01/2013. Final pathology was benign showing just inflammatory changes. No cyst components were identified.  EXAM: Surgical wound is healing very nicely at the medial left breast. No sign of inflammation or infection. No sign of seroma. Wound is fully epithelialized.  IMPRESSION: Status post reexcision of nonhealing wound left breast  PLAN: Patient will begin applying topical creams to her incision.  Patient will return for surgical care as needed.  Velora Heckler, MD, FACS General & Endocrine Surgery [facility]   Visit Diagnoses: 1. Wound healing, delayed

## 2014-05-31 ENCOUNTER — Other Ambulatory Visit: Payer: Self-pay | Admitting: Surgery

## 2014-05-31 DIAGNOSIS — N611 Abscess of the breast and nipple: Secondary | ICD-10-CM

## 2014-06-21 ENCOUNTER — Other Ambulatory Visit: Payer: 59

## 2015-03-12 ENCOUNTER — Encounter: Payer: Self-pay | Admitting: Physician Assistant

## 2015-03-12 ENCOUNTER — Ambulatory Visit: Payer: Self-pay | Admitting: Physician Assistant

## 2015-03-12 VITALS — BP 120/82 | HR 60 | Temp 98.4°F

## 2015-03-12 DIAGNOSIS — J069 Acute upper respiratory infection, unspecified: Secondary | ICD-10-CM

## 2015-03-12 MED ORDER — METHYLPREDNISOLONE 4 MG PO TBPK: ORAL_TABLET | ORAL | Status: DC

## 2015-03-12 MED ORDER — FLUCONAZOLE 150 MG PO TABS: ORAL_TABLET | ORAL | Status: DC

## 2015-03-12 MED ORDER — AMOXICILLIN-POT CLAVULANATE 875-125 MG PO TABS: 1.0000 | ORAL_TABLET | Freq: Two times a day (BID) | ORAL | Status: DC

## 2015-03-12 NOTE — Progress Notes (Signed)
S: C/o runny nose and congestion for 6 days, left ear pain, hoarse voice, fever on first day ; denies cp/sob, v/d;  cough is sporadic, c/o of facial pressure, sore throat  Using otc meds: allegra, flonase, mucinex  O: PE: vitals wnl, nad, perrl eomi, normocephalic, tms dull, pink on left side;  nasal mucosa red and swollen, throat injected, neck supple no lymph, lungs c t a, cv rrr, neuro intact, cough is dry, voice is hoarse  A:  Acute sinusitis/ uri   P: augmentin  bid x 10d, medrol dose pack, diflucan; drink fluids, continue regular meds , use otc meds of choice, return if not improving in 5 days, return earlier if worsening

## 2015-05-03 ENCOUNTER — Ambulatory Visit (INDEPENDENT_AMBULATORY_CARE_PROVIDER_SITE_OTHER): Payer: 59

## 2015-05-03 ENCOUNTER — Encounter: Payer: Self-pay | Admitting: Osteopathic Medicine

## 2015-05-03 ENCOUNTER — Ambulatory Visit (INDEPENDENT_AMBULATORY_CARE_PROVIDER_SITE_OTHER): Payer: 59 | Admitting: Osteopathic Medicine

## 2015-05-03 VITALS — BP 126/83 | HR 65 | Ht 62.5 in | Wt 217.0 lb

## 2015-05-03 DIAGNOSIS — M25569 Pain in unspecified knee: Secondary | ICD-10-CM

## 2015-05-03 DIAGNOSIS — J341 Cyst and mucocele of nose and nasal sinus: Secondary | ICD-10-CM

## 2015-05-03 DIAGNOSIS — E669 Obesity, unspecified: Secondary | ICD-10-CM | POA: Insufficient documentation

## 2015-05-03 DIAGNOSIS — M7630 Iliotibial band syndrome, unspecified leg: Secondary | ICD-10-CM | POA: Insufficient documentation

## 2015-05-03 DIAGNOSIS — J329 Chronic sinusitis, unspecified: Secondary | ICD-10-CM

## 2015-05-03 DIAGNOSIS — J342 Deviated nasal septum: Secondary | ICD-10-CM

## 2015-05-03 DIAGNOSIS — Z0000 Encounter for general adult medical examination without abnormal findings: Secondary | ICD-10-CM

## 2015-05-03 DIAGNOSIS — M25561 Pain in right knee: Secondary | ICD-10-CM

## 2015-05-03 DIAGNOSIS — Z79899 Other long term (current) drug therapy: Secondary | ICD-10-CM

## 2015-05-03 DIAGNOSIS — J309 Allergic rhinitis, unspecified: Secondary | ICD-10-CM | POA: Insufficient documentation

## 2015-05-03 DIAGNOSIS — Z113 Encounter for screening for infections with a predominantly sexual mode of transmission: Secondary | ICD-10-CM

## 2015-05-03 HISTORY — DX: Iliotibial band syndrome, unspecified leg: M76.30

## 2015-05-03 HISTORY — DX: Allergic rhinitis, unspecified: J30.9

## 2015-05-03 HISTORY — DX: Chronic sinusitis, unspecified: J32.9

## 2015-05-03 HISTORY — DX: Obesity, unspecified: E66.9

## 2015-05-03 HISTORY — DX: Pain in unspecified knee: M25.569

## 2015-05-03 LAB — POCT URINE PREGNANCY: Preg Test, Ur: NEGATIVE

## 2015-05-03 MED ORDER — AZELASTINE HCL 0.1 % NA SOLN: 1.0000 | Freq: Two times a day (BID) | NASAL | Status: DC

## 2015-05-03 NOTE — Patient Instructions (Addendum)
Plan to return to clinic in a few weeks to discuss CT sinus results, discuss weight loss medication options, and quickly review labs and get the Pap test done.   You should hear about scheduling the CT of the sinuses, please let us know if you don't get a call in the next few days. We will also plan to get labs today. We will call you with brief review of results once available and will review in detail at your next visit.   Would recommend follow-up with sports medicine, Dr Denyse Amassorey, as directed after your MRI for further discussion of knee pain.   Anything else we can do for you, let us know! -Dr. Mervyn SkeetersA.

## 2015-05-03 NOTE — Progress Notes (Signed)
HPI: Deanna Gross is a 30 y.o. female who presents to [name] today for chief complaint of:  Chief Complaint  Patient presents with  . Establish Care    "ANNUAL EXAM,KNEE PAIN, SINUS ISSUES, WEIGHT LOSS"    SINUSES . Location: sinus . Quality: "chronic sinusitis"  . Duration:"years" ongoing, usually once a month over few years . Context:3 weeks ago Zpack, less than a month before that on something else.  . Assoc signs/symptoms:hurts in cheeks and eyes, up the neck , comes and goes frequently.   Modifying Factors: Allegra, Flonase,   KNEE PAIN  . Location: R knee  . Quality: cracking with each step  . Duration: few years . Timing:Worse with working out.  . Context: previ ously seen by orthopedics and told nothing to worry about, now is more painful on lateral knee and under kneecap.  . Modifying factors: RICE not helping  . Assoc signs/symptoms: occasional R hip pain as well    Past medical, social and family history reviewed: Past Medical History  Diagnosis Date  . Headache(784.0)     sinus  . Asthma     exercise-induced; prn inhaler  . Surgical wound, non healing 08/2013    left breast  . Seasonal allergies   . Insect bites 08/30/2013  . History of MRSA infection 2008    left leg   Past Surgical History  Procedure Laterality Date  . Tonsillectomy and adenoidectomy    . Wisdom tooth extraction    . Tonsillectomy    . Breast cyst excision Left 03/18/2013    Procedure: EXCISE CYST LEFT MEDIAL BREAST;  Surgeon: Velora Heckler, MD;  Location: [facility];  Service: General;  Laterality: Left;  Marland Kitchen Tympanostomy tube placement      x 3  . Mass excision Left 09/01/2013    Procedure: RE-EXCISION NON HEALING WOUND LEFT BREAST;  Surgeon: Velora Heckler, MD;  Location: [facility];  Service: General;  Laterality: Left;   Social History  Substance Use Topics  . Smoking status: Never Smoker   . Smokeless tobacco:  Never Used  . Alcohol Use: Yes     Comment: rarely   Family History  Problem Relation Age of Onset  . Hyperlipidemia Father   . Hypertension Father   . Depression Father   . Cancer Maternal Grandfather     leukemia  . Cancer Paternal Grandfather     non-hodgkins lymphoma  . Hypothyroidism Mother   . Stroke Maternal Grandmother   . Thyroid disease Mother     Current Outpatient Prescriptions  Medication Sig Dispense Refill  . albuterol (PROVENTIL HFA;VENTOLIN HFA) 108 (90 BASE) MCG/ACT inhaler Inhale into the lungs every 6 (six) hours as needed for wheezing or shortness of breath.    . cetirizine (ZYRTEC) 10 MG tablet Take 10 mg by mouth daily. Reported on 03/12/2015    . fexofenadine (ALLEGRA) 180 MG tablet Take 180 mg by mouth daily.    . fluticasone (FLONASE) 50 MCG/ACT nasal spray Place into both nostrils daily.    . Multiple Vitamin (MULTIVITAMIN) tablet Take 1 tablet by mouth daily.     No current facility-administered medications for this visit.   Allergies  Allergen Reactions  . Polytrim [Polymyxin B-Trimethoprim] Other (See Comments)    OPHTHALMIC - FACIAL/EYE SWELLING      Review of Systems: CONSTITUTIONAL:  No  fever, no chills, No  unintentional weight changes HEAD/EYES/EARS/NOSE/THROAT: (+) sinus headache, no vision change,  no hearing change, No  sore throat, (+) sinus pressure CARDIAC: No  chest pain, No  pressure, No palpitations, No  orthopnea RESPIRATORY: No  cough, No  shortness of breath/wheeze GASTROINTESTINAL: No  nausea, No  vomiting, No  abdominal pain, No  blood in stool, No  diarrhea, No  constipation  MUSCULOSKELETAL: (+) myalgia/arthralgia GENITOURINARY: No  incontinence, No  abnormal genital bleeding/discharge SKIN: No  rash/wounds/concerning lesions HEM/ONC: No  easy bruising/bleeding, No  abnormal lymph node ENDOCRINE: No polyuria/polydipsia/polyphagia, No  heat/cold intolerance  NEUROLOGIC: No  weakness, No  dizziness, No  slurred  speech PSYCHIATRIC: No  concerns with depression, No  concerns with anxiety, No sleep problems  Exam:  BP 126/83 mmHg  Pulse 65  Ht 5' 2.5" (1.588 m)  Wt 217 lb (98.431 kg)  BMI 39.03 kg/m2 Constitutional: VS see above. General Appearance: alert, well-developed, well-nourished, NAD Eyes: Normal lids and conjunctive, non-icteric sclera,  Ears, Nose, Mouth, Throat: MMM, Normal external inspection ears/nares/mouth/lips/gums Neck: No masses, trachea midline. No thyroid enlargement/tenderness/mass appreciated. No lymphadenopathy Respiratory: Normal respiratory effort. no wheeze, no rhonchi, no rales Cardiovascular: S1/S2 normal, no murmur, no rub/gallop auscultated. RRR. No lower extremity edema. Musculoskeletal: Gait normal. No clubbing/cyanosis of digits Knee exam: right  Patellar Subluxation: negative  Lachman (ACL): negative  Knee Posterior Drawer (PCL): negative  Valgus Stress (MCL): negative  Varus Stress (LCL): negative  McMurry's Lateral Meniscus pain: positive  McMurrys' Medial Meniscus pain: negative Neurological: No cranial nerve deficit on limited exam. Motor and sensation intact and symmetric Skin: warm, dry, intact. No rash/ulcer. No concerning nevi or subq nodules on limited exam.   Psychiatric: Normal judgment/insight. Normal mood and affect. Oriented x3.    Results for orders placed or performed in visit on 05/03/15 (from the past 72 hour(s))  POCT urine pregnancy     Status: None   Collection Time: 05/03/15 10:16 AM  Result Value Ref Range   Preg Test, Ur Negative Negative     ASSESSMENT/PLAN: See pt instructions  Chronic sinusitis, unspecified location - Plan: CT MAXILLOFACIAL LTD WO CM, CANCELED: CT Maxillofacial WO CM  Allergic rhinitis, unspecified allergic rhinitis type - Plan: azelastine (ASTELIN) 0.1 % nasal spray  Recurrent knee pain, right - Plan: MR Knee Right Wo Contrast, POCT urine pregnancy  Knee meniscus pain, right  Obesity (BMI 30-39.9) -  Plan: CBC with Differential/Platelet, COMPLETE METABOLIC PANEL WITH GFR, Lipid panel, TSH  Routine screening for STI (sexually transmitted infection) - Plan: HIV antibody, Hepatitis panel, acute, RPR  Medication management - Plan: CBC with Differential/Platelet, COMPLETE METABOLIC PANEL WITH GFR  Annual physical exam - Plan: CBC with Differential/Platelet, COMPLETE METABOLIC PANEL WITH GFR, HIV antibody, Lipid panel, TSH, Hepatitis panel, acute, RPR, VITAMIN D 25 Hydroxy (Vit-D Deficiency, Fractures)    FEMALE PREVENTIVE CARE  ANNUAL SCREENING/COUNSELING Tobacco - no Never  Alcohol - social drinker Diet/Exercise - HEALTHY HABITS DISCUSSED TO DECREASE CV RISK - working out, problems with eating and portion control.  Depression - PQH2 Negative Domestic violence concerns - no HTN SCREENING - SEE VITALS Vaccination status - SEE BELOW  SEXUAL HEALTH Sexually active in the past year - yes With - Yes with female. STI - The patient denies history of sexually transmitted disease. STI testing today? - yes - due for Pap  INFECTIOUS DISEASE SCREENING HIV - all adults 15-65 - needs GC/CT - sexually active - needs HepC - DOB [birth year] - does not need TB - does not need  DISEASE SCREENING Lipid - needs DM2 -  needs Osteoporosis - does not need  CANCER SCREENING Cervical - needs Breast -- does not need Lung - does not need Colon - does not need  ADULT VACCINATION Influenza - already has Td - already has HPV - was not indicated Zoster - was not indicated Pneumonia - was not indicated  OTHER Fall - exercise and Vit D age 60+ - does not need Consider ASA - age 31-59 - does not need  Return in about 2 weeks (around 05/17/2015), or sooner if needed, for Pap and other followup w/ Dr Lyn HollingsheadAlexander. As directed with Dr. Denyse Amassorey. .  There are other unrelated non-urgent complaints, but due to the busy schedule and the amount of time I've already spent with her, time does not permit me to address  these routine issues at today's visit. I've requested another appointment to review these additional issues.

## 2015-05-04 ENCOUNTER — Encounter: Payer: Self-pay | Admitting: Osteopathic Medicine

## 2015-05-07 ENCOUNTER — Ambulatory Visit (INDEPENDENT_AMBULATORY_CARE_PROVIDER_SITE_OTHER): Payer: 59

## 2015-05-07 DIAGNOSIS — M25561 Pain in right knee: Secondary | ICD-10-CM | POA: Diagnosis not present

## 2015-05-10 ENCOUNTER — Encounter: Payer: Self-pay | Admitting: Family Medicine

## 2015-05-10 ENCOUNTER — Ambulatory Visit (INDEPENDENT_AMBULATORY_CARE_PROVIDER_SITE_OTHER): Payer: 59 | Admitting: Family Medicine

## 2015-05-10 ENCOUNTER — Other Ambulatory Visit
Admission: RE | Admit: 2015-05-10 | Discharge: 2015-05-10 | Disposition: A | Discharge status: Home / Self Care | Payer: 59 | Source: Ambulatory Visit | Attending: Osteopathic Medicine | Admitting: Osteopathic Medicine

## 2015-05-10 VITALS — BP 124/80 | HR 61 | Wt 220.0 lb

## 2015-05-10 DIAGNOSIS — E669 Obesity, unspecified: Secondary | ICD-10-CM | POA: Diagnosis not present

## 2015-05-10 DIAGNOSIS — Z0000 Encounter for general adult medical examination without abnormal findings: Secondary | ICD-10-CM | POA: Insufficient documentation

## 2015-05-10 DIAGNOSIS — M7631 Iliotibial band syndrome, right leg: Secondary | ICD-10-CM

## 2015-05-10 DIAGNOSIS — Z113 Encounter for screening for infections with a predominantly sexual mode of transmission: Secondary | ICD-10-CM | POA: Diagnosis not present

## 2015-05-10 LAB — COMPREHENSIVE METABOLIC PANEL
ALBUMIN: 4.3 g/dL (ref 3.5–5.0)
ALK PHOS: 53 U/L (ref 38–126)
ALT: 22 U/L (ref 14–54)
AST: 22 U/L (ref 15–41)
Anion gap: 5 (ref 5–15)
BUN: 20 mg/dL (ref 6–20)
CALCIUM: 9.2 mg/dL (ref 8.9–10.3)
CO2: 24 mmol/L (ref 22–32)
CREATININE: 0.7 mg/dL (ref 0.44–1.00)
Chloride: 108 mmol/L (ref 101–111)
GFR calc Af Amer: >60 mL/min (ref >60)
GFR calc non Af Amer: >60 mL/min (ref >60)
GLUCOSE: 91 mg/dL (ref 65–99)
Potassium: 3.7 mmol/L (ref 3.5–5.1)
Sodium: 137 mmol/L (ref 135–145)
Total Bilirubin: 0.6 mg/dL (ref 0.3–1.2)
Total Protein: 6.9 g/dL (ref 6.5–8.1)

## 2015-05-10 LAB — LIPID PANEL
CHOL/HDL RATIO: 2.3 ratio
CHOLESTEROL: 182 mg/dL (ref 0–200)
HDL: 78 mg/dL (ref >40)
LDL Cholesterol: 95 mg/dL (ref 0–99)
Triglycerides: 45 mg/dL (ref <150)
VLDL: 9 mg/dL (ref 0–40)

## 2015-05-10 LAB — CBC WITH DIFFERENTIAL/PLATELET
BASOS ABS: 0 10*3/uL (ref 0–0.1)
BASOS PCT: 1 %
Eosinophils Absolute: 0.1 10*3/uL (ref 0–0.7)
Eosinophils Relative: 2 %
HEMATOCRIT: 40.3 % (ref 35.0–47.0)
Hemoglobin: 14.2 g/dL (ref 12.0–16.0)
Lymphocytes Relative: 41 %
Lymphs Abs: 2.4 10*3/uL (ref 1.0–3.6)
MCH: 30.5 pg (ref 26.0–34.0)
MCHC: 35.2 g/dL (ref 32.0–36.0)
MCV: 86.6 fL (ref 80.0–100.0)
MONOS PCT: 7 %
Monocytes Absolute: 0.4 10*3/uL (ref 0.2–0.9)
NEUTROS ABS: 2.9 10*3/uL (ref 1.4–6.5)
NEUTROS PCT: 49 %
Platelets: 225 10*3/uL (ref 150–440)
RBC: 4.66 MIL/uL (ref 3.80–5.20)
RDW: 13 % (ref 11.5–14.5)
WBC: 5.8 10*3/uL (ref 3.6–11.0)

## 2015-05-10 LAB — TSH: TSH: 1.699 u[IU]/mL (ref 0.350–4.500)

## 2015-05-10 NOTE — Patient Instructions (Signed)
Thank you for coming in today. Go to PT.  Return in 1 month.   Iliotibial Band Syndrome Iliotibial band syndrome is pain in the outer, lower thigh. The pain is caused by an inflammation of the iliotibial band. This is a band of thick fibrous tissue that runs down the outside of the thigh. The iliotibial band begins at the hip. It extends to the outer side of the shin bone (tibia) just below the knee joint. The band works with the thigh muscles. Together they provide stability to the outside of the knee joint. Iliotibial band syndrome occurs when there is inflammation to this band of tissue. This is typically due to over use and not due to an injury. The irritation usually occurs over the outside of the knee joint, at the the end of the thigh bone (femur). The iliotibial band crosses bone and muscle at this point. Between these structures is a cushioning sac (bursa). The bursa should make possible a smooth gliding motion. However, when inflamed, the iliotibial band does not glide easily. When inflamed, there is pain with motion of the knee. Usually the pain worsens with continued movement and the pain goes away with rest. This problem usually arises when there is a sudden increase in sports activities involving your legs. Running, and playing soccer or basketball are examples of activities causing this. Others who are prone to iliotibial band syndrome include individuals with mechanical problems such as leg length differences, abnormality of walking, bowed legs etc. HOME CARE INSTRUCTIONS   Apply ice to the injured area:  Put ice in a plastic bag.  Place a towel between your skin and the bag.  Leave the ice on for 20 minutes, 2-3 times a day.  Limit excessive training or eliminate training until pain goes away.  While pain is present, you may use gentle range of motion. Do not resume regular use until instructed by your health care provider. Begin use gradually. Do not increase activity to the  point of pain. If pain does develop, decrease activity and continue the above measures. Gradually increase activities that do not cause discomfort. Do this until you finally achieve normal use.  Perform low-impact activities while pain is present. Wear proper footwear.  Only take over-the-counter or prescription medicines for pain, discomfort, or fever as directed by your health care provider. SEEK MEDICAL CARE IF:   Your pain increases or pain is not controlled with medications.  You develop new, unexplained symptoms, or an increase of the symptoms that brought you to your health care provider.  Your pain and symptoms are not improving or are getting worse.   This information is not intended to replace advice given to you by your health care provider. Make sure you discuss any questions you have with your health care provider.   Document Released: 07/05/2001 Document Revised: 02/03/2014 Document Reviewed: 08/12/2012 Elsevier Interactive Patient Education [employer]2016 Elsevier Inc.

## 2015-05-10 NOTE — Assessment & Plan Note (Signed)
Distal IT band syndrome. Refer to physical therapy. Recheck in about 4 weeks.

## 2015-05-10 NOTE — Progress Notes (Signed)
   Subjective:    I'm seeing this patient as a consultation for:  Dr. Lyn HollingsheadAlexander  CC: Right knee pain  HPI: Patient notes a several week history of right knee pain. She denies any specific injury. No fevers chills nausea vomiting or diarrhea. She was seen by her PCP recently who obtained an MRI which showed distal IT band syndrome. She is wondering what she can do for her pain. She does note clicking sensation in the lateral knee with ambulation.  Past medical history, Surgical history, Family history not pertinant except as noted below, Social history, Allergies, and medications have been entered into the medical record, reviewed, and no changes needed.   Review of Systems: No headache, visual changes, nausea, vomiting, diarrhea, constipation, dizziness, abdominal pain, skin rash, fevers, chills, night sweats, weight loss, swollen lymph nodes, body aches, joint swelling, muscle aches, chest pain, shortness of breath, mood changes, visual or auditory hallucinations.   Objective:    Filed Vitals:   05/10/15 1358  BP: 124/80  Pulse: 61   General: Well Developed, well nourished, and in no acute distress.  Neuro/Psych: Alert and oriented x3, extra-ocular muscles intact, able to move all 4 extremities, sensation grossly intact. Skin: Warm and dry, no rashes noted.  Respiratory: Not using accessory muscles, speaking in full sentences, trachea midline.  Cardiovascular: Pulses palpable, no extremity edema. Abdomen: Does not appear distended. MSK: Right knee: Normal-appearing. Mildly tender to palpation lateral knee. Normal motion. Stable ligamentous exam. Negative McMurray's testing.  MRI right knee dated May 07 2015 reviewed   No results found.  Impression and Recommendations:   This case required medical decision making of moderate complexity.

## 2015-05-11 LAB — HEPATITIS PANEL, ACUTE
HCV Ab: <0.1 s/co ratio (ref 0.0–0.9)
HEP A IGM: NEGATIVE
HEP B C IGM: NEGATIVE
Hepatitis B Surface Ag: NEGATIVE

## 2015-05-11 LAB — HIV ANTIBODY (ROUTINE TESTING W REFLEX): HIV Screen 4th Generation wRfx: NONREACTIVE

## 2015-05-11 LAB — VITAMIN D 25 HYDROXY (VIT D DEFICIENCY, FRACTURES): Vit D, 25-Hydroxy: 33.8 ng/mL (ref 30.0–100.0)

## 2015-05-11 LAB — RPR: RPR Ser Ql: NONREACTIVE

## 2015-05-16 ENCOUNTER — Ambulatory Visit (INDEPENDENT_AMBULATORY_CARE_PROVIDER_SITE_OTHER): Payer: 59 | Admitting: Rehabilitative and Restorative Service Providers"

## 2015-05-16 ENCOUNTER — Encounter: Payer: Self-pay | Admitting: Rehabilitative and Restorative Service Providers"

## 2015-05-16 DIAGNOSIS — R29898 Other symptoms and signs involving the musculoskeletal system: Secondary | ICD-10-CM | POA: Diagnosis not present

## 2015-05-16 DIAGNOSIS — R293 Abnormal posture: Secondary | ICD-10-CM

## 2015-05-16 NOTE — Therapy (Signed)
[facility] [place] [address] [place], [place], [address] Phone: [phone]   Fax:  [phone]  Physical Therapy Evaluation  Patient Details  Name: Deanna Gross MRN: [id number] Date of Birth: 07-30-1985 Referring Provider: Dr. Clementeen Graham   Encounter Date: 05/16/2015      PT End of Session - 05/16/15 1609    Visit Number 1   Number of Visits 12   Date for PT Re-Evaluation 06/27/15   PT Start Time 1610   PT Stop Time 1706   PT Time Calculation (min) 56 min   Activity Tolerance Patient tolerated treatment well      Past Medical History  Diagnosis Date  . Headache(784.0)     sinus  . Asthma     exercise-induced; prn inhaler  . Surgical wound, non healing 08/2013    left breast  . Seasonal allergies   . Insect bites 08/30/2013  . History of MRSA infection 2008    left leg    Past Surgical History  Procedure Laterality Date  . Tonsillectomy and adenoidectomy    . Wisdom tooth extraction    . Tonsillectomy    . Breast cyst excision Left 03/18/2013    Procedure: EXCISE CYST LEFT MEDIAL BREAST;  Surgeon: Velora Heckler, MD;  Location: [facility];  Service: General;  Laterality: Left;  Marland Kitchen Tympanostomy tube placement      x 3  . Mass excision Left 09/01/2013    Procedure: RE-EXCISION NON HEALING WOUND LEFT BREAST;  Surgeon: Velora Heckler, MD;  Location: [facility];  Service: General;  Laterality: Left;    There were no vitals filed for this visit.       Subjective Assessment - 05/16/15 1613    Subjective Patient reports that she has had "clicking" in the Rt knee with every step she takes and the knee is painful where it clicks with pain only occuring for the past 3 weeks or so. Seen by health care providers in the past with no intervention. MRI recently - showed IT band symdrome.    Pertinent History Denies any other musculoskeletal problems.    How long can you sit comfortably? painful  with any sitting   How long can you stand comfortably? painful with any standing   How long can you walk comfortably? painful with any walking    Diagnostic tests MRI   Patient Stated Goals get rid og the pain and walk without clicking    Currently in Pain? Yes   Pain Score 5    Pain Location Knee   Pain Orientation Right   Pain Descriptors / Indicators Aching;Nagging   Pain Type Acute pain   Pain Onset 1 to 4 weeks ago   Pain Frequency Constant   Aggravating Factors  walking; standing for prolonged periods of time; squatting; ascending or descending stairs   Pain Relieving Factors better when she didn't work out and off work            [facility] PT Assessment - 05/16/15 0001    Assessment   Medical Diagnosis Rt IT band symdrome    Referring Provider Dr. Clementeen Graham    Onset Date/Surgical Date 04/26/15   Hand Dominance Right   Next MD Visit 06/11/15   Prior Therapy none   Precautions   Precautions None   Precaution Comments listen to your body and avoid things that irritate symptoms    Balance Screen   Has the patient fallen  in the past 6 months No   Has the patient had a decrease in activity level because of a fear of falling?  No   Is the patient reluctant to leave their home because of a fear of falling?  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, museum; exercises at least 4days/wk beachj body on demand exercise video varies in work out    Observation/Other Assessments   Focus on Therapeutic Outcomes (FOTO)  19% limitation    Sensation   Additional Comments WFL's per pt report    Posture/Postural Control   Posture Comments stands with knees hyperextended; feet in slight ER; sits  with feet inverted/toes turned in    AROM   Overall AROM Comments WFL's some tightness through hips end range    Strength   Overall Strength Comments 5/5 bilat LE's except Rt hip abduction 5-/5    Flexibility   Hamstrings tight Rt ~ 80 deg; Lt    Quadriceps WFL's    ITB tight Rt IT band    Piriformis tight Rt    Palpation   Palpation comment tightness Rt glut med/piriformis/IT band; tender at lateral Rt knee at tibial insertion of IT band    Ambulation/Gait   Gait Comments slight toe in gait pattern; click audible wit hknee flexion at knee release on Rt                    [facility] Adult PT Treatment/Exercise - 05/16/15 0001    Neuro Re-ed    Neuro Re-ed Details  worked on standing and sitting postures; discussed sleeping position and position of LE with driving   Knee/Hip Exercises: Stretches   Passive Hamstring Stretch 3 reps;30 seconds   ITB Stretch 3 reps;30 seconds   Piriformis Stretch 3 reps;30 seconds   Other Knee/Hip Stretches standing IT band stretch 30 sec x 3    Moist Heat Therapy   Number Minutes Moist Heat 15 Minutes   Moist Heat Location Hip   Electrical Stimulation   Electrical Stimulation Location Rt hip to thigh    Electrical Stimulation Action IFC   Electrical Stimulation Parameters tpo tolerance   Electrical Stimulation Goals Pain;Tone   Iontophoresis   Type of Iontophoresis Dexamethasone   Location fibular head area    Dose 40 mAmp dose    Time 6 hr                 PT Education - 05/16/15 1653    Education provided Yes   Education Details HEP; myofacial ball release work    Teacher, music) Educated Patient   Methods Explanation;Demonstration;Tactile cues;Verbal cues;Handout   Comprehension Verbalized understanding;Returned demonstration;Verbal cues required;Tactile cues required             PT Long Term Goals - 05/16/15 1714    PT LONG TERM GOAL #1   Title Improve pain Rt knee by 50-75% per pt report 06/27/15   Time 6   Period  Weeks   Status New   PT LONG TERM GOAL #2   Title Increase tissue extensibility Rt hip and LE to palpation 06/27/15   Time 6   Period Weeks   Status New  PT LONG TERM GOAL #3   Title I in HEP 06/27/15   Time 6   Period Weeks   Status New   PT LONG TERM GOAL #4   Title Improve FOTO to </= 15% limitation 06/27/15   Time 6   Period Weeks   Status New               Plan - 05/16/15 1704    Clinical Impression Statement Patient presents with Rt knee pain as weil as myofacial tightness through the Rt knee and hip including the IT band; glut medius; piriformis; fibularius longus/brevis. She has abnormal posture and alignment in standing and sitting. She sleeps on her stomach with Rt LE flexed and externally rotated all of which may contribute to sympotms. Patient will benefit form PT to address problems identified.    Rehab Potential Good   PT Frequency 2x / week   PT Duration 6 weeks   PT Treatment/Interventions Neuromuscular re-education;Patient/family education;ADLs/Self Care Home Management;Therapeutic exercise;Therapeutic activities;Cryotherapy;Electrical Stimulation;Iontophoresis 4mg /ml Dexamethasone;Moist Heat;Ultrasound;Manual techniques;Dry needling   PT Next Visit Plan stretching; manual work; possibly TDN; modalities as indicated    PT Home Exercise Plan HEP   Consulted and Agree with Plan of Care Patient      Patient will benefit from skilled therapeutic intervention in order to improve the following deficits and impairments:  Postural dysfunction, Improper body mechanics, Pain, Increased fascial restricitons, Increased muscle spasms, Decreased strength, Decreased endurance, Decreased activity tolerance, Abnormal gait  Visit Diagnosis: Other symptoms and signs involving the musculoskeletal system - Plan: PT plan of care cert/re-cert  Abnormal posture - Plan: PT plan of care cert/re-cert     Problem List Patient Active Problem List   Diagnosis Date Noted  .  Obesity (BMI 30-39.9) 05/03/2015  . IT band syndrome 05/03/2015  . Recurrent knee pain 05/03/2015  . Rhinitis, allergic 05/03/2015  . Chronic infection of sinus 05/03/2015  . Wound healing, delayed 06/21/2013  . Cellulitis and abscess of trunk 02/10/2013    Celyn Rober MinionP Holt PT, MPH  05/16/2015, 5:20 PM  [facility] [address] [place], [place], [address] Phone: [phone]   Fax:  [phone]  Name: Deanna Gross MRN: [id number] Date of Birth: 1985/03/27

## 2015-05-16 NOTE — Patient Instructions (Signed)
Don't sleep on your stomach  Don't stand with knees locked Don't sit with toes turned in  Check position of right leg when driving   Self massage using a 4 inch rubber ball or "the stick"  HIP: Hamstrings - Supine   Place strap around foot. Raise leg up, keeping knee straight.  Bend opposite knee to protect back if indicated. Hold 30 seconds. 3 reps per set, 2-3 sets per day     Outer Hip Stretch: Reclined IT Band Stretch (Strap)   Strap around one foot, pull leg across body until you feel a pull or stretch, with shoulders on mat. Hold for 30 seconds. Repeat 3 times each leg. 2-3 times/day.  Lateral Trunk / IT Band Stretch    Stand with ball to wall at hip level, other leg crossed in front of ball-side leg. Reach above head with arms and keep both feet on floor. Hold __30_ seconds. Do __2-3 repetitions.    Piriformis Stretch   Lying on back, pull right knee toward opposite shoulder. Hold 30 seconds. Repeat 3 times. Do 2-3 sessions per day.   TENS UNIT: This is helpful for muscle pain and spasm.   Search and Purchase a TENS 7000 2nd edition at www.tenspros.com. It should be less than $30.     TENS unit instructions: Do not shower or bathe with the unit on Turn the unit off before removing electrodes or batteries If the electrodes lose stickiness add a drop of water to the electrodes after they are disconnected from the unit and place on plastic sheet. If you continued to have difficulty, call the TENS unit company to purchase more electrodes. Do not apply lotion on the skin area prior to use. Make sure the skin is clean and dry as this will help prolong the life of the electrodes. After use, always check skin for unusual red areas, rash or other skin difficulties. If there are any skin problems, does not apply electrodes to the same area. Never remove the electrodes from the unit by pulling the wires. Do not use the TENS unit or electrodes other than as  directed. Do not change electrode placement without consultating your therapist or physician. Keep 2 fingers with between each electrode.

## 2015-05-17 ENCOUNTER — Other Ambulatory Visit (HOSPITAL_COMMUNITY)
Admission: RE | Admit: 2015-05-17 | Discharge: 2015-05-17 | Disposition: A | Discharge status: Home / Self Care | Payer: 59 | Source: Ambulatory Visit | Attending: Osteopathic Medicine | Admitting: Osteopathic Medicine

## 2015-05-17 ENCOUNTER — Encounter: Payer: Self-pay | Admitting: Osteopathic Medicine

## 2015-05-17 ENCOUNTER — Ambulatory Visit (INDEPENDENT_AMBULATORY_CARE_PROVIDER_SITE_OTHER): Payer: 59 | Admitting: Osteopathic Medicine

## 2015-05-17 VITALS — BP 119/81 | HR 67 | Ht 62.5 in | Wt 218.0 lb

## 2015-05-17 DIAGNOSIS — Z01419 Encounter for gynecological examination (general) (routine) without abnormal findings: Secondary | ICD-10-CM | POA: Insufficient documentation

## 2015-05-17 DIAGNOSIS — Z113 Encounter for screening for infections with a predominantly sexual mode of transmission: Secondary | ICD-10-CM | POA: Diagnosis not present

## 2015-05-17 DIAGNOSIS — R87810 Cervical high risk human papillomavirus (HPV) DNA test positive: Secondary | ICD-10-CM | POA: Insufficient documentation

## 2015-05-17 DIAGNOSIS — Z124 Encounter for screening for malignant neoplasm of cervix: Secondary | ICD-10-CM

## 2015-05-17 DIAGNOSIS — Z1151 Encounter for screening for human papillomavirus (HPV): Secondary | ICD-10-CM | POA: Insufficient documentation

## 2015-05-17 DIAGNOSIS — N760 Acute vaginitis: Secondary | ICD-10-CM | POA: Diagnosis not present

## 2015-05-17 MED ORDER — FLUCONAZOLE 150 MG PO TABS: ORAL_TABLET | ORAL | Status: DC

## 2015-05-17 NOTE — Patient Instructions (Signed)
WEIGHT MANAGEMENT OPTIONS:  Research these medications before you come back for further discussion about weight loss. If helpful to know a little bit about your various options prior to a full discussion with me in the office. These are covered fairly well under the [employer] employee insurance unless you have the high deductible insurance.  Qsymia (Phentermine and Topiramate) Saxenda (Liraglutide 3 mg/day) Contrave (Bupropion and Naltrexone) Lorcaserin (Belviq or Belviq XR) Phentermine (Fastin, Lonamin, Lomira)   Please let us know if you don't hear back about your ENT referral, I message our referral coordinator to look into this for you as well.

## 2015-05-17 NOTE — Progress Notes (Signed)
HPI: Deanna Gross is a 30 y.o. female who presents to [name] today for chief complaint of:  Chief Complaint  Patient presents with  . Gynecologic Exam     Doing well today, here for Pap, no complaints    Past medical, social and family history reviewed: Past Medical History  Diagnosis Date  . Headache(784.0)     sinus  . Asthma     exercise-induced; prn inhaler  . Surgical wound, non healing 08/2013    left breast  . Seasonal allergies   . Insect bites 08/30/2013  . History of MRSA infection 2008    left leg   Past Surgical History  Procedure Laterality Date  . Tonsillectomy and adenoidectomy    . Wisdom tooth extraction    . Tonsillectomy    . Breast cyst excision Left 03/18/2013    Procedure: EXCISE CYST LEFT MEDIAL BREAST;  Surgeon: Velora Heckler, MD;  Location: [facility];  Service: General;  Laterality: Left;  Marland Kitchen Tympanostomy tube placement      x 3  . Mass excision Left 09/01/2013    Procedure: RE-EXCISION NON HEALING WOUND LEFT BREAST;  Surgeon: Velora Heckler, MD;  Location: [facility];  Service: General;  Laterality: Left;   Social History  Substance Use Topics  . Smoking status: Never Smoker   . Smokeless tobacco: Never Used  . Alcohol Use: Yes     Comment: rarely   Family History  Problem Relation Age of Onset  . Hyperlipidemia Father   . Hypertension Father   . Depression Father   . Cancer Maternal Grandfather     leukemia  . Cancer Paternal Grandfather     non-hodgkins lymphoma  . Hypothyroidism Mother   . Stroke Maternal Grandmother   . Thyroid disease Mother     Current Outpatient Prescriptions  Medication Sig Dispense Refill  . albuterol (PROVENTIL HFA;VENTOLIN HFA) 108 (90 BASE) MCG/ACT inhaler Inhale into the lungs every 6 (six) hours as needed for wheezing or shortness of breath.    Marland Kitchen azelastine (ASTELIN) 0.1 % nasal spray Place 1-2 sprays into both nostrils 2 (two) times  daily. Use in each nostril as directed 30 mL 6  . fexofenadine (ALLEGRA) 180 MG tablet Take 180 mg by mouth daily.    . fluticasone (FLONASE) 50 MCG/ACT nasal spray Place into both nostrils daily.    . Multiple Vitamin (MULTIVITAMIN) tablet Take 1 tablet by mouth daily.    . fluconazole (DIFLUCAN) 150 MG tablet Take one tablet by mouth once. Repeat dose in 48-72 hours if symptoms do not resolve. 2 tablet 1   No current facility-administered medications for this visit.   Allergies  Allergen Reactions  . Polytrim [Polymyxin B-Trimethoprim] Other (See Comments)    OPHTHALMIC - FACIAL/EYE SWELLING      Review of Systems: CONSTITUTIONAL:  No recent illness CARDIAC: No  chest pain RESPIRATORY: No  cough, No  shortness of breath/wheeze GASTROINTESTINAL: No  nausea, No  vomiting, No  abdominal pain, GENITOURINARY: No  incontinence, No  abnormal genital bleeding/discharge, some vaginal itching, concerned about possible yeast infection SKIN: No  rash/wounds/concerning lesions  Exam:  BP 119/81 mmHg  Pulse 67  Ht 5' 2.5" (1.588 m)  Wt 218 lb (98.884 kg)  BMI 39.21 kg/m2  LMP 04/11/2015 Constitutional: VS see above. General Appearance: alert, well-developed, well-nourished, NAD Eyes: Normal lids and conjunctive, non-icteric sclera, PERRLA Ears, Nose, Mouth, Throat: MMM, Normal external inspection ears/nares/mouth/lips/gums,  Neck: No masses, trachea midline.  Respiratory: Normal respiratory effort. Gastrointestinal: Nontender, no masses.  Musculoskeletal: Gait normal.  Skin: warm, dry, intact. No rash/ulcer. No concerning nevi or subq nodules on limited exam.   GYN: No lesions/ulcers to external genitalia, mild hidradenitis groin bilaterally, normal urethra, normal vaginal mucosa, whitish discharge, cervix not friable and no significant lesions, uterus not enlarged or tender, adnexa no masses and nontender    ASSESSMENT/PLAN: Pap today, routine STI screening, we'll treat for vaginal  yeast infection and test for BV as well.   Cervical cancer screening - Plan: Cytology - PAP   Return in about 1 week (around 05/24/2015) for WEIGHT LOSS MEDICATION DISCUSSION .

## 2015-05-21 ENCOUNTER — Ambulatory Visit (INDEPENDENT_AMBULATORY_CARE_PROVIDER_SITE_OTHER): Payer: 59 | Admitting: Physical Therapy

## 2015-05-21 DIAGNOSIS — R293 Abnormal posture: Secondary | ICD-10-CM | POA: Diagnosis not present

## 2015-05-21 DIAGNOSIS — R29898 Other symptoms and signs involving the musculoskeletal system: Secondary | ICD-10-CM

## 2015-05-21 NOTE — Therapy (Signed)
[facility] [address] [place], [place], [address] Phone: [phone]   Fax:  [phone]  Physical Therapy Treatment  Patient Details  Name: Deanna Gross MRN: [id number] Date of Birth: [birth year] Referring Provider: Dr. Denyse Amassorey  Encounter Date: 05/21/2015      PT End of Session - 05/21/15 1608    Visit Number 2   Number of Visits 12   Date for PT Re-Evaluation 06/27/15   PT Start Time 1606   PT Stop Time 1657   PT Time Calculation (min) 51 min   Activity Tolerance Patient tolerated treatment well      Past Medical History  Diagnosis Date  . Headache(784.0)     sinus  . Asthma     exercise-induced; prn inhaler  . Surgical wound, non healing 08/2013    left breast  . Seasonal allergies   . Insect bites 08/30/2013  . History of MRSA infection 2008    left leg    Past Surgical History  Procedure Laterality Date  . Tonsillectomy and adenoidectomy    . Wisdom tooth extraction    . Tonsillectomy    . Breast cyst excision Left 03/18/2013    Procedure: EXCISE CYST LEFT MEDIAL BREAST;  Surgeon: Velora Hecklerodd M Gerkin, MD;  Location: [facility];  Service: General;  Laterality: Left;  Marland Kitchen. Tympanostomy tube placement      x 3  . Mass excision Left 09/01/2013    Procedure: RE-EXCISION NON HEALING WOUND LEFT BREAST;  Surgeon: Velora Hecklerodd M Gerkin, MD;  Location:  [facility];  Service: General;  Laterality: Left;    There were no vitals filed for this visit.      Subjective Assessment - 05/21/15 1608    Subjective Pt reports she had no pain for 3 days. Pt has been trying to alter sleep position, avoiding stomach sleeping and instead sleeping with pillow between knees.    Currently in Pain? Yes   Pain Score 6    Pain Location Knee   Pain Orientation Right   Pain Radiating Towards up into Rt hip    Aggravating Factors  standing, squatting    Pain Relieving Factors stretches, ice             [facility] PT  Assessment - 05/21/15 0001    Assessment   Medical Diagnosis Rt IT band symdrome    Referring Provider Dr. Denyse Amassorey   Onset Date/Surgical Date 04/26/15   Hand Dominance Right   Next MD Visit 06/11/15          [facility] Adult PT Treatment/Exercise - 05/21/15 0001    Self-Care   Self-Care Other Self-Care Comments   Other Self-Care Comments  pt instructed in self massage to Rt hip with ball. Pt verbalized understanding after demonstration was given.    Knee/Hip Exercises: Stretches   Passive Hamstring Stretch 30 seconds;3 reps;Right   ITB Stretch 30 seconds;3 reps;Right   Piriformis Stretch 3 reps;30 seconds;Right   Other Knee/Hip Stretches standing IT band stretch 30 sec x 2   Other Knee/Hip Stretches Rt adductor stretch x 30 sec x 2 reps   Knee/Hip Exercises: Aerobic   Nustep L4: 5 min    Knee/Hip Exercises: Sidelying   Hip ABduction Strengthening;Right;2 sets;10 reps  (toe pointed down)   Clams 1 set of 10 on RLE   Moist Heat Therapy   Number Minutes Moist Heat 15 Minutes   Moist Heat Location Hip   Electrical Stimulation   Electrical  Stimulation Location Rt hip/ glute    Electrical Stimulation Action IFC   Electrical Stimulation Parameters to tolerance    Electrical Stimulation Goals Pain;Tone   Iontophoresis   Type of Iontophoresis Dexamethasone   Location Rt posterior fibular head area   Dose 1.3 cc   Time 12 hr patch   Manual Therapy   Manual Therapy Soft tissue mobilization   Manual therapy comments Pt Lt sidelying    Soft tissue mobilization TPR to Rt hip abductors and rotators with contract relax.                       PT Long Term Goals - 05/16/15 1714    PT LONG TERM GOAL #1   Title Improve pain Rt knee by 50-75% per pt report 06/27/15   Time 6   Period Weeks   Status New   PT LONG TERM GOAL #2   Title Increase tissue extensibility Rt hip and LE to palpation 06/27/15   Time 6   Period Weeks   Status New   PT LONG TERM GOAL #3   Title I in HEP  06/27/15   Time 6   Period Weeks   Status New   PT LONG TERM GOAL #4   Title Improve FOTO to </= 15% limitation 06/27/15   Time 6   Period Weeks   Status New               Plan - 05/21/15 1654    Clinical Impression Statement Pt had positive response to last treatment with elimination of pain for 3 days.  Pt tolerated all exercises well today.  Pt reported decrease of pain at end of session with use of estim and MHP.  Progressing towards goals.    Rehab Potential Good   PT Frequency 2x / week   PT Duration 6 weeks   PT Treatment/Interventions Neuromuscular re-education;Patient/family education;ADLs/Self Care Home Management;Therapeutic exercise;Therapeutic activities;Cryotherapy;Electrical Stimulation;Iontophoresis /ml Dexamethasone;Moist Heat;Ultrasound;Manual techniques;Dry needling   PT Next Visit Plan Continue progressive stretching and strengthening to Rt hip; manual work; possibly TDN; modalities as indicated    PT Home Exercise Plan add self massage with ball to Rt hip to HEP.    Consulted and Agree with Plan of Care Patient      Patient will benefit from skilled therapeutic intervention in order to improve the following deficits and impairments:  Postural dysfunction, Improper body mechanics, Pain, Increased fascial restricitons, Increased muscle spasms, Decreased strength, Decreased endurance, Decreased activity tolerance, Abnormal gait  Visit Diagnosis: Other symptoms and signs involving the musculoskeletal system  Abnormal posture     Problem List Patient Active Problem List   Diagnosis Date Noted  . Obesity (BMI 30-39.9) 05/03/2015  . IT band syndrome 05/03/2015  . Recurrent knee pain 05/03/2015  . Rhinitis, allergic 05/03/2015  . Chronic infection of sinus 05/03/2015  . Wound healing, delayed 06/21/2013  . Cellulitis and abscess of trunk 02/10/2013   Mayer Camel, PTA 05/21/2015 4:57 PM  [facility]  [place] [address] [place], [place], [address] Phone: [phone]   Fax:  [phone]  Name: Deanna Gross MRN: [id number] Date of Birth: Apr 25, 1985

## 2015-05-22 ENCOUNTER — Ambulatory Visit (INDEPENDENT_AMBULATORY_CARE_PROVIDER_SITE_OTHER): Payer: 59 | Admitting: Osteopathic Medicine

## 2015-05-22 ENCOUNTER — Encounter: Payer: Self-pay | Admitting: Osteopathic Medicine

## 2015-05-22 VITALS — BP 121/84 | HR 69 | Ht 62.5 in | Wt 219.0 lb

## 2015-05-22 DIAGNOSIS — B977 Papillomavirus as the cause of diseases classified elsewhere: Secondary | ICD-10-CM

## 2015-05-22 DIAGNOSIS — E669 Obesity, unspecified: Secondary | ICD-10-CM | POA: Diagnosis not present

## 2015-05-22 LAB — CYTOLOGY - PAP

## 2015-05-22 MED ORDER — NALTREXONE-BUPROPION HCL ER 8-90 MG PO TB12: ORAL_TABLET | ORAL | Status: DC

## 2015-05-22 NOTE — Progress Notes (Signed)
HPI: Deanna Gross is a 30 y.o. female who presents to [name] today for chief complaint of:  Chief Complaint  Patient presents with  . Follow-up    dicsuss obesity medications, review Pap results     OBESITY - Pt has researched list of anti-obesity medications and feels comfortable with Contrave. She has been on Bupropion in the past for mood issues and did well on this medicine though didn't notice significant weight change with Bupropion alone. Has been on Phentermine in the past but even low doses of this caused anxiety and insomnia.   ABNORMAL PAP - discussed with patient as below. She is concerned about future fertility problems and has questions about if/how this affects pregnancy.     Past medical, social and family history reviewed: Past Medical History  Diagnosis Date  . Headache(784.0)     sinus  . Asthma     exercise-induced; prn inhaler  . Surgical wound, non healing 08/2013    left breast  . Seasonal allergies   . Insect bites 08/30/2013  . History of MRSA infection 2008    left leg   Past Surgical History  Procedure Laterality Date  . Tonsillectomy and adenoidectomy    . Wisdom tooth extraction    . Tonsillectomy    . Breast cyst excision Left 03/18/2013    Procedure: EXCISE CYST LEFT MEDIAL BREAST;  Surgeon: Velora Heckler, MD;  Location: [facility];  Service: General;  Laterality: Left;  Marland Kitchen Tympanostomy tube placement      x 3  . Mass excision Left 09/01/2013    Procedure: RE-EXCISION NON HEALING WOUND LEFT BREAST;  Surgeon: Velora Heckler, MD;  Location: [facility];  Service: General;  Laterality: Left;   Social History  Substance Use Topics  . Smoking status: Never Smoker   . Smokeless tobacco: Never Used  . Alcohol Use: Yes     Comment: rarely   Family History  Problem Relation Age of Onset  . Hyperlipidemia Father   . Hypertension Father   . Depression Father   . Cancer Maternal  Grandfather     leukemia  . Cancer Paternal Grandfather     non-hodgkins lymphoma  . Hypothyroidism Mother   . Stroke Maternal Grandmother   . Thyroid disease Mother     Current Outpatient Prescriptions  Medication Sig Dispense Refill  . albuterol (PROVENTIL HFA;VENTOLIN HFA) 108 (90 BASE) MCG/ACT inhaler Inhale into the lungs every 6 (six) hours as needed for wheezing or shortness of breath.    Marland Kitchen azelastine (ASTELIN) 0.1 % nasal spray Place 1-2 sprays into both nostrils 2 (two) times daily. Use in each nostril as directed 30 mL 6  . fexofenadine (ALLEGRA) 180 MG tablet Take 180 mg by mouth daily.    . fluticasone (FLONASE) 50 MCG/ACT nasal spray Place into both nostrils daily.    . Multiple Vitamin (MULTIVITAMIN) tablet Take 1 tablet by mouth daily.     No current facility-administered medications for this visit.   Allergies  Allergen Reactions  . Polytrim [Polymyxin B-Trimethoprim] Other (See Comments)    OPHTHALMIC - FACIAL/EYE SWELLING      Review of Systems:  CONSTITUTIONAL: No recent illness CARDIAC: No chest pain RESPIRATORY: No cough GASTROINTESTINAL: No nausea, No vomiting, No abdominal pain,  Exam:  BP 121/84 mmHg  Pulse 69  Ht 5' 2.5" (1.588 m)  Wt 219 lb (99.338 kg)  BMI 39.39 kg/m2  LMP 04/11/2015 Constitutional: VS  see above. General Appearance: alert, well-developed, well-nourished, NAD Psychiatric: Normal judgment/insight. Normal mood and affect. Oriented x3.    No results found for this or any previous visit (from the past 72 hour(s)).    ASSESSMENT/PLAN:  Obesity - Start Contrave, RTC weight checks as directed, let me know if any problems/adverse effects with medicine - Plan: Naltrexone-Bupropion HCl ER 8-90 MG TB12 along with portion control, exercise. Can consider referral to dietician  HPV in female - Pap NILM, HPV(+) - opts to repeat cotest 1 year rather than do HPV DNA typing and possible colposcopy, advised that she should discuss with  OB at a pre-conception counseling prior to trying to get pregnant, see if OB has other recs, but HPV should not affect fertility however if she requires any procedures for advanced cervical pathology if this progresses, then that may be a consideration. She plans to start trying for a baby in the fall after she gets married - I advised seek OB advice anyway for pre-conception counseling and also STOP CONTRAVE prior to getting pregnant. Currently using condoms consistently   All questions were answered. Visit summary with updated medication list and pertinent instructions was printed for patient. ER/RTC precautions were reviewed with the patient. Return in about 4 weeks (around 06/19/2015), or sooner if needed, for [facility].

## 2015-05-22 NOTE — Patient Instructions (Addendum)
Human Papillomavirus Human papillomavirus (HPV) is the most common sexually transmitted infection (STI) and is highly contagious. HPV infections cause genital warts and cancers to the outlet of the womb (cervix), birth canal (vagina), opening of the birth canal (vulva), and anus. There are over 100 types of HPV. Unless wartlike lesions are present in the throat or there are genital warts that you can see or feel, HPV usually does not cause symptoms. It is possible to be infected for long periods and pass it on to others without knowing it. CAUSES  HPV is spread from person to person through sexual contact. This includes oral, vaginal, or anal sex. RISK FACTORS  Having unprotected sex. HPV can be spread by oral, vaginal, or anal sex.  Having several sex partners.  Having a sex partner who has other sex partners.  Having or having had another sexually transmitted infection. SIGNS AND SYMPTOMS  Most people carrying HPV do not have any symptoms. If symptoms are present, symptoms may include:  Wartlike lesions in the throat (from having oral sex).  Warts in the infected skin or mucous membranes.  Genital warts that may itch, burn, or bleed.  Genital warts that may be painful or bleed during sexual intercourse. DIAGNOSIS  If wartlike lesions are present in the throat or genital warts are present, your health care provider can usually diagnose HPV by physical examination.   Genital warts are easily seen with the naked eye.  Currently, there is no FDA-approved test to detect HPV in males.  In females, a Pap test can show cells that are infected with HPV.  In females, a scope can be used to view the cervix (colposcopy). A colposcopy can be performed if the pelvic exam or Pap test is abnormal. A sample of tissue may be removed (biopsy) during the colposcopy. TREATMENT  There is no treatment for the virus itself. However, there are treatments for the health problems and symptoms HPV can cause.  Your health care provider will follow you closely after you are treated. This is because the HPV can come back and may need treatment again. Treatment of HPV may include:   Medicines, which may be injected or applied in a cream, lotion, or gel form.  Use of a probe to apply extreme cold (cryotherapy).  Application of an intense beam of light (laser treatment).  Use of a probe to apply extreme heat (electrocautery).  Surgery. HOME CARE INSTRUCTIONS   Take medicines only as directed by your health care provider.  Use over-the-counter creams for itching or irritation as directed by your health care provider.  Keep all follow-up visits as directed by your health care provider. This is important.  Do not touch or scratch the warts.  Do not treat genital warts with medicines used for treating hand warts.  Do not have sex while you are being treated.  Do not douche or use tampons during treatment of HPV.  Tell your sex partner about your infection because he or she may also need treatment.  If you become pregnant, tell your health care provider that you have had HPV. Your health care provider will monitor you closely during pregnancy to be sure your baby is safe.  After treatment, use condoms during sex to prevent future infections.  Have only one sex partner.  Have a sex partner who does not have other sex partners. PREVENTION   Talk to your health care provider about getting the HPV vaccines. These vaccines prevent some HPV infections and cancers.   It is recommended that the vaccine be given to males and females between the ages of 9 and 26 years old. It will not work if you already have HPV, and it is not recommended for pregnant women.  A Pap test is done to screen for cervical cancer in women.  The first Pap test should be done at age 21 years.  Between ages 21 and 29 years, Pap tests are repeated every 2 years.  Beginning at age 30, you are advised to have a Pap test every  3 years as long as your past 3 Pap tests have been normal.  Some women have medical problems that increase the chance of getting cervical cancer. Talk to your health care provider about these problems. It is especially important to talk to your health care provider if a new problem develops soon after your last Pap test. In these cases, your health care provider may recommend more frequent screening and Pap tests.  The above recommendations are the same for women who have or have not gotten the vaccine for HPV.  If you had a hysterectomy for a problem that was not a cancer or a condition that could lead to cancer, then you no longer need Pap tests. However, even if you no longer need a Pap test, a regular exam is a good idea to make sure no other problems are starting.   If you are between the ages of 65 and 70 years and you have had normal Pap tests going back 10 years, you no longer need Pap tests. However, even if you no longer need a Pap test, a regular exam is a good idea to make sure no other problems are starting.  If you have had past treatment for cervical cancer or a condition that could lead to cancer, you need Pap tests and screening for cancer for at least 20 years after your treatment.  If Pap tests have been discontinued, risk factors (such as a new sexual partner)need to be reassessed to determine if screening should be resumed.  Some women may need screenings more often if they are at high risk for cervical cancer. SEEK MEDICAL CARE IF:   The treated skin becomes red, swollen, or painful.  You have a fever.  You feel generally ill.  You feel lumps or pimple-like projections in and around your genital area.  You develop bleeding of the vagina or the treatment area.  You have painful sexual intercourse. MAKE SURE YOU:   Understand these instructions.  Will watch your condition.  Will get help if you are not doing well or get worse.   This information is not  intended to replace advice given to you by your health care provider. Make sure you discuss any questions you have with your health care provider.   Document Released: 04/05/2003 Document Revised: 02/03/2014 Document Reviewed: 04/20/2013 Elsevier Interactive Patient Education 2016 [employer]  

## 2015-05-23 ENCOUNTER — Encounter: Payer: Self-pay | Admitting: Physical Therapy

## 2015-05-23 ENCOUNTER — Other Ambulatory Visit: Payer: Self-pay | Admitting: Osteopathic Medicine

## 2015-05-23 DIAGNOSIS — Z789 Other specified health status: Secondary | ICD-10-CM

## 2015-05-23 HISTORY — DX: Other specified health status: Z78.9

## 2015-05-24 LAB — CERVICOVAGINAL ANCILLARY ONLY
BACTERIAL VAGINITIS: NEGATIVE
Candida vaginitis: NEGATIVE

## 2015-05-25 ENCOUNTER — Telehealth: Payer: Self-pay | Admitting: Osteopathic Medicine

## 2015-05-25 NOTE — Telephone Encounter (Signed)
Please call patient: The lab sent me some updated results for her recent Pap. They went ahead and automatically tested for HPV types 16, 18, and 45. As we discussed at her visit, these are the HPV strains that most commonly cause cervical cancer. Good news is, she was negative for these strains. This further supports our decision to repeat Pap at one year, or see if OB/GYN has any other recommendations when she establishes care for preconception counseling. Any questions let me know

## 2015-05-25 NOTE — Telephone Encounter (Signed)
Spoke to patient gave her results as noted below. [name],CMA  

## 2015-05-28 ENCOUNTER — Encounter: Payer: Self-pay | Admitting: Physical Therapy

## 2015-05-28 ENCOUNTER — Telehealth: Payer: Self-pay | Admitting: *Deleted

## 2015-05-28 NOTE — Telephone Encounter (Signed)
Initiated PA through covermymed for [place]rave   [place] [id number]

## 2015-05-28 NOTE — Telephone Encounter (Signed)
Deanna Simpsoncontrave has been approved from 05/28/2015 through 09/28/2015  Patient and pharm notified via vm

## 2015-05-31 ENCOUNTER — Ambulatory Visit (INDEPENDENT_AMBULATORY_CARE_PROVIDER_SITE_OTHER): Payer: 59 | Admitting: Physical Therapy

## 2015-05-31 DIAGNOSIS — R29898 Other symptoms and signs involving the musculoskeletal system: Secondary | ICD-10-CM

## 2015-05-31 DIAGNOSIS — R293 Abnormal posture: Secondary | ICD-10-CM | POA: Diagnosis not present

## 2015-05-31 NOTE — Patient Instructions (Signed)

## 2015-05-31 NOTE — Therapy (Addendum)
[facility] [place], [place], [address] Phone: [phone]   Fax:  [phone]  Physical Therapy Treatment  Patient Details  Name: Deanna Gross MRN: [id number] Date of Birth: Sep 21, 1985 Referring Provider: Dr. Georgina Snell   Encounter Date: 05/31/2015      PT End of Session - 05/31/15 1159    Visit Number 3   Number of Visits 12   Date for PT Re-Evaluation 06/27/15   PT Start Time 1150   PT Stop Time 2831   PT Time Calculation (min) 55 min   Activity Tolerance Patient tolerated treatment well;No increased pain      Past Medical History  Diagnosis Date  . Headache(784.0)     sinus  . Asthma     exercise-induced; prn inhaler  . Surgical wound, non healing 08/2013    left breast  . Seasonal allergies   . Insect bites 08/30/2013  . History of MRSA infection 2008    left leg    Past Surgical History  Procedure Laterality Date  . Tonsillectomy and adenoidectomy    . Wisdom tooth extraction    . Tonsillectomy    . Breast cyst excision Left 03/18/2013    Procedure: EXCISE CYST LEFT MEDIAL BREAST;  Surgeon: Earnstine Regal, MD;  Location: [facility];  Service: General;  Laterality: Left;  Marland Kitchen Tympanostomy tube placement      x 3  . Mass excision Left 09/01/2013    Procedure: RE-EXCISION NON HEALING WOUND LEFT BREAST;  Surgeon: Earnstine Regal, MD;  Location: [facility];  Service: General;  Laterality: Left;    There were no vitals filed for this visit.      Subjective Assessment - 05/31/15 1154    Subjective Pt reports her Rt hip pain has resolved, but her Rt knee is persistant.  She is still avoiding stomach sleeping, stretching and icing after running.  Pt doesn't have pain with running, but pain in Rt knee the next day.  Has changed work shoes (to [employer]) and it has helped .   Patient Stated Goals get rid of the pain, walk without clicking, and run 5k without pain.    Currently in  Pain? Yes   Pain Score 1    Pain Location Knee   Pain Orientation Right   Pain Descriptors / Indicators Dull   Aggravating Factors  standing and squatting    Pain Relieving Factors stretches, ice             [facility] PT Assessment - 05/31/15 0001    Assessment   Medical Diagnosis Rt IT band symdrome    Referring Provider Dr. Georgina Snell    Onset Date/Surgical Date 04/26/15   Hand Dominance Right   Next MD Visit 06/11/15   Strength   Overall Strength Comments 5/5 bilat LE's          [facility] Adult PT Treatment/Exercise - 05/31/15 0001    Self-Care   Self-Care Other Self-Care Comments   Other Self-Care Comments  Pt instructed in self massage with roller to Rt hamstring and calf to decrease tightness.  Pt returned demo   Knee/Hip Exercises: Stretches   Passive Hamstring Stretch 2 reps;Right;30 seconds   Gastroc Stretch 2 reps;Right;Left   Soleus Stretch Right;Left;2 reps;30 seconds   Other Knee/Hip Stretches standing IT band stretch 30 sec x 2 each side    Knee/Hip Exercises: Aerobic   Nustep L4: 5 min    Knee/Hip Exercises: Standing  SLS Rt SLS with heel taps (and mini single leg squat) to front, side, back and backwards diagonal (like speed skater)    Knee/Hip Exercises: Sidelying   Hip ADduction Strengthening;Right;1 set;10 reps   Moist Heat Therapy   Iontophoresis   Type of Iontophoresis Dexamethasone   Location Rt fibular head    Dose 1.3 cc   Time 6 hr patch   Manual Therapy   Manual Therapy Soft tissue mobilization   Manual therapy comments Pt prone   Soft tissue mobilization TPR to Rt lateral distal hamstring  with contract relax.           Trigger Point Dry Needling - 05/31/15 1241    Consent Given? Yes   Education Handout Provided Yes   Muscles Treated Lower Body Hamstring  Rt   Hamstring Response Palpable increased muscle length;Twitch response elicited    Performed by supervising PT.            PT Education - 05/31/15 1241    Education provided  Yes   Education Details TDN   Person(s) Educated Patient   Methods Explanation;Handout   Comprehension Verbalized understanding             PT Long Term Goals - 05/31/15 1202    PT LONG TERM GOAL #1   Title Improve pain Rt knee by 50-75% per pt report 06/27/15   Time 6   Period Weeks   Status Achieved  75% improvement   PT LONG TERM GOAL #2   Title Increase tissue extensibility Rt hip and LE to palpation 06/27/15   Time 6   Period Weeks   Status On-going  continued Rt hamstring tightness    PT LONG TERM GOAL #3   Title I in HEP 06/27/15   Time 6   Status On-going   PT LONG TERM GOAL #4   Title Improve FOTO to </= 15% limitation 06/27/15   Time 6   Period Weeks   Status On-going               Plan - 05/31/15 1247    Clinical Impression Statement Pt tolerated new exercises without increase in pain in RLE.  Pt presented with tightness in Rt lateral hamstring, point tender with manual therapy.  Supervising therapist assessed response to TDN; had a good twitch response with TDN in Rt lateral hamstring.  Pt has met LTG # 1.    Rehab Potential Good   PT Frequency 2x / week   PT Duration 6 weeks   PT Treatment/Interventions Neuromuscular re-education;Patient/family education;ADLs/Self Care Home Management;Therapeutic exercise;Therapeutic activities;Cryotherapy;Electrical Stimulation;Iontophoresis '4mg'$ /ml Dexamethasone;Moist Heat;Ultrasound;Manual techniques;Dry needling   PT Next Visit Plan Assess response to TDN.  FOTO and MD note.    Consulted and Agree with Plan of Care Patient      Patient will benefit from skilled therapeutic intervention in order to improve the following deficits and impairments:  Postural dysfunction, Improper body mechanics, Pain, Increased fascial restricitons, Increased muscle spasms, Decreased strength, Decreased endurance, Decreased activity tolerance, Abnormal gait  Visit Diagnosis: Other symptoms and signs involving the musculoskeletal  system  Abnormal posture     Problem List Patient Active Problem List   Diagnosis Date Noted  . Uses condoms for contraception 05/23/2015  . HPV in female 05/22/2015  . Obesity (BMI 30-39.9) 05/03/2015  . IT band syndrome 05/03/2015  . Recurrent knee pain 05/03/2015  . Rhinitis, allergic 05/03/2015  . Chronic infection of sinus 05/03/2015  . Wound healing, delayed 06/21/2013  .  Cellulitis and abscess of trunk 02/10/2013   Kerin Perna, PTA 05/31/2015 12:51 PM  Jeral Pinch, PT 05/31/2015 12:54 PM   [facility] [address] [place], [place], [address] Phone: [phone]   Fax:  [phone]  Name: Deanna Gross MRN: [id number] Date of Birth: Mar 23, 1985   PHYSICAL THERAPY DISCHARGE SUMMARY  Visits from Start of Care: 3  Current functional level related to goals / functional outcomes: Excellent progress with PT - resolution of hip pain and tightness. Good response to stretching and strengthening exercises. Trial of TDN but patient did not return following last treatment.    Remaining deficits: Progressing well with minimal tightness to palpation noted through the lateral hip and hamstrings. Needs to continue with HEP    Education / Equipment: HEP; TDN Plan: Patient agrees to discharge.  Patient goals were partially met. Patient is being discharged due to not returning since the last visit.  ?????     Celyn P. Helene Kelp PT, MPH 08/02/2015 10:55 AM

## 2015-06-11 ENCOUNTER — Encounter: Payer: Self-pay | Admitting: Family Medicine

## 2015-06-11 ENCOUNTER — Ambulatory Visit (INDEPENDENT_AMBULATORY_CARE_PROVIDER_SITE_OTHER): Payer: 59 | Admitting: Family Medicine

## 2015-06-11 VITALS — BP 120/81 | HR 73 | Ht 62.0 in | Wt 217.0 lb

## 2015-06-11 DIAGNOSIS — M7631 Iliotibial band syndrome, right leg: Secondary | ICD-10-CM | POA: Diagnosis not present

## 2015-06-11 NOTE — Patient Instructions (Signed)
Thank you for coming in today. Call or go to the ER if you develop a large red swollen joint with extreme pain or oozing puss.  Return as needed.   Iliotibial Band Syndrome With Rehab The iliotibial (IT) band is a tendon that connects the hip muscles to the shinbone (tibia) and to one of the bones of the pelvis (ileum). The IT band passes by the knee and is often irritated by the outer portion of the knee (lateral femoral condyle). A fluid filled sac (bursa) exists between the tendon and the bone, to cushion and reduce friction. Overuse of the tendon may cause excessive friction, which results in IT band syndrome. This condition involves inflammation of the bursa (bursitis) and/or inflammation of the IT band (tendinitis). SYMPTOMS   Pain, tenderness, swelling, warmth, or redness over the IT band, at the outer knee (above the joint).  Pain that travels up or down the thigh or leg.  Initially, pain at the beginning of an exercise, that decreases once warmed up. Eventually, pain throughout the activity, getting worse as the activity continues. May cause the athlete to stop in the middle of training or competing.  Pain that gets worse when running down hills or stairs, on banked tracks, or next to the curb on the street.  Pain that increases when the foot of the affected leg hits the ground.  Possibly, a crackling sound (crepitation) when the tendon or bursa is moved or touched. CAUSES  IT band syndrome is caused by irritation of the IT band and the underlying bursa. This eventually results in inflammation and pain. IT band syndrome is an overuse injury.  RISK INCREASES WITH:  Sports with repetitive knee-bending activities (distance running, cycling).  Incorrect training techniques, including sudden changes in the intensity, frequency, or duration of training.  Not enough rest between workouts.  Poor strength and flexibility, especially a tight IT band.  Failure to warm up properly before  activity.  Bow legs.  Arthritis of the knee. PREVENTION   Warm up and stretch properly before activity.  Allow for adequate recovery between workouts.  Maintain physical fitness:  Strength, flexibility, and endurance.  Cardiovascular fitness.  Learn and use proper training technique, including reducing running mileage, shortening stride, and avoiding running on hills and banked surfaces.  Wear arch supports (orthotics), if you have flat feet. PROGNOSIS  If treated properly, IT band syndrome usually goes away within 6 weeks of treatment. RELATED COMPLICATIONS   Longer healing time, if not properly treated, or if not given enough time to heal.  Recurring inflammation of the tendon and bursa, that may result in a chronic condition.  Recurring symptoms, if activity is resumed too soon, with overuse, with a direct blow, or with poor training technique.  Inability to complete training or competition. TREATMENT  Treatment first involves the use of ice and medicine, to reduce pain and inflammation. The use of strengthening and stretching exercises may help reduce pain with activity. These exercises may be performed at home or with a therapist. For individuals with flat feet, an arch support (orthotic) may be helpful. Some individuals find that wearing a knee sleeve or compression bandage around the knee during workouts provides some relief. Certain training techniques, such as adjusting stride length, avoiding running on hills or stairs, changing the direction you run on a circular or banked track, or changing the side of the road you run on, if you run next to the curb, may help decrease symptoms of IT band syndrome.  Cyclists may need to change the seat height or foot position on their bicycles. An injection of cortisone into the bursa may be recommended. Surgery to remove the inflamed bursa and/or part of the IT band is only considered after at least 6 months of non-surgical treatment.    MEDICATION   If pain medicine is needed, nonsteroidal anti-inflammatory medicines (aspirin and ibuprofen), or other minor pain relievers (acetaminophen), are often advised.  Do not take pain medicine for 7 days before surgery.  Prescription pain relievers may be given, if your caregiver thinks they are needed. Use only as directed and only as much as you need.  Corticosteroid injections may be given by your caregiver. These injections should be reserved for the most serious cases, because they may only be given a certain number of times. HEAT AND COLD  Cold treatment (icing) should be applied for 10 to 15 minutes every 2 to 3 hours for inflammation and pain, and immediately after activity that aggravates your symptoms. Use ice packs or an ice massage.  Heat treatment may be used before performing stretching and strengthening activities prescribed by your caregiver, physical therapist, or athletic trainer. Use a heat pack or a warm water soak. SEEK MEDICAL CARE IF:   Symptoms get worse or do not improve in 2 to 4 weeks, despite treatment.  New, unexplained symptoms develop. (Drugs used in treatment may produce side effects.) EXERCISES  RANGE OF MOTION (ROM) AND STRETCHING EXERCISES - Iliotibial Band Syndrome These exercises may help you when beginning to rehabilitate your injury. Your symptoms may go away with or without further involvement from your physician, physical therapist or athletic trainer. While completing these exercises, remember:   Restoring tissue flexibility helps normal motion to return to the joints. This allows healthier, less painful movement and activity.  An effective stretch should be held for at least 30 seconds.  A stretch should never be painful. You should only feel a gentle lengthening or release in the stretched tissue. STRETCH - Quadriceps, Prone   Lie on your stomach on a firm surface, such as a bed or padded floor.  Bend your right / left knee and  grasp your ankle. If you are unable to reach your ankle or pant leg, use a belt around your foot to lengthen your reach.  Gently pull your heel toward your buttocks. Your knee should not slide out to the side. You should feel a stretch in the front of your thigh and knee.  Hold this position for __________ seconds. Repeat __________ times. Complete this stretch __________ times per day.  STRETCH - Iliotibial Band  On the floor or bed, lie on your side, so your right / left leg is on top. Bend your knee and grab your ankle.  Slowly bring your knee back so that your thigh is in line with your trunk. Keep your heel at your buttocks and gently arch your back, so your head, shoulders and hips line up.  Slowly lower your leg so that your knee approaches the floor or bed, until you feel a gentle stretch on the outside of your right / left thigh. If you do not feel a stretch and your knee will not fall farther, place the heel of your opposite foot on top of your knee, and pull your thigh down farther.  Hold this stretch for __________ seconds. Repeat __________ times. Complete this stretch __________ times per day. STRENGTHENING EXERCISES - Iliotibial Band Syndrome Improving the flexibility of the IT band will  best relieve your discomfort due to IT band syndrome. Strengthening exercises, however, can help improve both muscle endurance and joint mechanics, reducing the factors that can contribute to this condition. Your physician, physical therapist or athletic trainer may provide you with exercises that train specific muscle groups that are especially weak. The following exercises target muscles that are often weak in people who have IT band syndrome. STRENGTH - Hip Abductors, Straight Leg Raises  Be aware of your form throughout the entire exercise, so that you exercise the correct muscles. Poor form means that you are not strengthening the correct muscles.  Lie on your side, so that your head,  shoulders, knee and hip line up. You may bend your lower knee to help maintain your balance. Your right / left leg should be on top.  Roll your hips slightly forward, so that your hips are stacked directly over each other and your right / left knee is facing forward.  Lift your top leg up 4-6 inches, leading with your heel. Be sure that your foot does not drift forward and that your knee does not roll toward the ceiling.  Hold this position for __________ seconds. You should feel the muscles in your outer hip lifting (you may not notice this until your leg begins to tire).  Slowly lower your leg to the starting position. Allow the muscles to fully relax before beginning the next repetition. Repeat __________ times. Complete this exercise __________ times per day.  STRENGTH - Quad/VMO, Isometric  Sit in a chair with your right / left knee slightly bent. With your fingertips, feel the VMO muscle (just above the inside of your knee). The VMO is important in controlling the position of your kneecap.  Keeping your fingertips on this muscle. Without actually moving your leg, attempt to drive your knee down, as if straightening your leg. You should feel your VMO tense. If you have a difficult time, you may wish to try the same exercise on your healthy knee first.  Tense this muscle as hard as you can, without increasing any knee pain.  Hold for __________ seconds. Relax the muscles slowly and completely between each repetition. Repeat __________ times. Complete this exercise __________ times per day.    This information is not intended to replace advice given to you by your health care provider. Make sure you discuss any questions you have with your health care provider.   Document Released: 01/13/2005 Document Revised: 02/03/2014 Document Reviewed: 04/27/2008 Elsevier Interactive Patient Education [employer]2016 [employer] Inc.

## 2015-06-11 NOTE — Progress Notes (Signed)
Deanna Gross is a 30 y.o. female who [facility] today for follow-up right knee pain. Patient continues to have right knee pain despite physical therapy for distal IT band syndrome. She notes painful clicking with activity and has not improved. Her pain is limiting her ability to exercise.   Past Medical History  Diagnosis Date  . Headache(784.0)     sinus  . Asthma     exercise-induced; prn inhaler  . Surgical wound, non healing 08/2013    left breast  . Seasonal allergies   . Insect bites 08/30/2013  . History of MRSA infection 2008    left leg   Past Surgical History  Procedure Laterality Date  . Tonsillectomy and adenoidectomy    . Wisdom tooth extraction    . Tonsillectomy    . Breast cyst excision Left 03/18/2013    Procedure: EXCISE CYST LEFT MEDIAL BREAST;  Surgeon: Velora Hecklerodd M Gerkin, MD;  Location: [facility];  Service: General;  Laterality: Left;  Marland Kitchen. Tympanostomy tube placement      x 3  . Mass excision Left 09/01/2013    Procedure: RE-EXCISION NON HEALING WOUND LEFT BREAST;  Surgeon: Velora Hecklerodd M Gerkin, MD;  Location: [facility];  Service: General;  Laterality: Left;   Social History  Substance Use Topics  . Smoking status: Never Smoker   . Smokeless tobacco: Never Used  . Alcohol Use: Yes     Comment: rarely   family history includes Cancer in her maternal grandfather and paternal grandfather; Depression in her father; Hyperlipidemia in her father; Hypertension in her father; Hypothyroidism in her mother; Stroke in her maternal grandmother; Thyroid disease in her mother.  ROS as above Medications: Current Outpatient Prescriptions  Medication Sig Dispense Refill  . albuterol (PROVENTIL HFA;VENTOLIN HFA) 108 (90 BASE) MCG/ACT inhaler Inhale into the lungs every 6 (six) hours as needed for wheezing or shortness of breath.    Marland Kitchen.  azelastine (ASTELIN) 0.1 % nasal spray Place 1-2 sprays into both nostrils 2 (two) times daily. Use in each nostril as directed 30 mL 6  . fexofenadine (ALLEGRA) 180 MG tablet Take 180 mg by mouth daily.    . fluticasone (FLONASE) 50 MCG/ACT nasal spray Place into both nostrils daily.    . Multiple Vitamin (MULTIVITAMIN) tablet Take 1 tablet by mouth daily.    . Naltrexone-Bupropion HCl ER 8-90 MG TB12 Start: 1 tab po qam x 1 week, then 1 tab po bid x 1 week, then 2 tabs po qam and 1 tab po qhs x 1 week, then 2 tabs po bid - MAX 4 TABS PER DAY 70 tablet 0   No current facility-administered medications for this visit.   Allergies  Allergen Reactions  . Polytrim [Polymyxin B-Trimethoprim] Other (See Comments)    OPHTHALMIC - FACIAL/EYE SWELLING     Exam:  BP 120/81 mmHg  Pulse 73  Ht 5\' 2"  (1.575 m)  Wt 217 lb (98.431 kg)  BMI 39.68 kg/m2 Gen: Well NAD Right knee normal-appearing. Tender palpation at the distal IT band near the insertion and wear the IT band courses over the femoral condyle. Painful click with knee range of motion.  Procedure: Real-time Ultrasound Guided Injection of right knee distal IT band bursa  Device: GE Logiq E  Images permanently stored and available for review in the ultrasound unit. Verbal informed consent obtained. Discussed risks and benefits of procedure. Warned about infection bleeding  damage to structures skin hypopigmentation and fat atrophy among others. Patient expresses understanding and agreement Time-out conducted.  Noted no overlying erythema, induration, or other signs of local infection.  Skin prepped in a sterile fashion.  Local anesthesia: Topical Ethyl chloride.  With sterile technique and under real time ultrasound guidance: 40 mg of Depo-Medrol and 3 mL of Marcaine injected easily.  Completed without difficulty  Pain immediately resolved suggesting accurate placement of the medication.  Advised to call if fevers/chills,  erythema, induration, drainage, or persistent bleeding.  Images permanently stored and available for review in the ultrasound unit.  Impression: Technically successful ultrasound guided injection.   Ultrasound guidance needed due to body habitus  No results found for this or any previous visit (from the past 24 hour(s)). No results found.   30 year old woman with right knee distal IT band syndrome. Complete resolution of symptoms following diagnostic and therapeutic ultrasound guided injection. Plan for continued home exercise program and return as needed.

## 2015-06-19 ENCOUNTER — Ambulatory Visit: Payer: Self-pay

## 2015-06-21 ENCOUNTER — Encounter: Payer: Self-pay | Admitting: Osteopathic Medicine

## 2015-06-21 ENCOUNTER — Ambulatory Visit (INDEPENDENT_AMBULATORY_CARE_PROVIDER_SITE_OTHER): Payer: 59 | Admitting: Osteopathic Medicine

## 2015-06-21 VITALS — BP 134/84 | HR 60 | Ht 62.0 in | Wt 216.0 lb

## 2015-06-21 DIAGNOSIS — E669 Obesity, unspecified: Secondary | ICD-10-CM | POA: Diagnosis not present

## 2015-06-21 DIAGNOSIS — L03818 Cellulitis of other sites: Secondary | ICD-10-CM | POA: Diagnosis not present

## 2015-06-21 MED ORDER — NALTREXONE-BUPROPION HCL ER 8-90 MG PO TB12: ORAL_TABLET | ORAL | Status: DC

## 2015-06-21 MED ORDER — CLINDAMYCIN HCL 300 MG PO CAPS: 300.0000 mg | ORAL_CAPSULE | Freq: Three times a day (TID) | ORAL | Status: DC

## 2015-06-21 MED ORDER — OXYCODONE-ACETAMINOPHEN 5-325 MG PO TABS: 1.0000 | ORAL_TABLET | Freq: Four times a day (QID) | ORAL | Status: DC | PRN

## 2015-06-21 NOTE — Progress Notes (Signed)
HPI: Deanna Gross is a 30 y.o. female who presents to [facility] today for chief complaint of:  Chief Complaint  Patient presents with  . Cyst    Cyst/Infection . Location: L breast . Quality: sore, swelling, red skin . Duration: few days . Context: hx cyst removal and occasoinal subsequent infections in same area . Modifying factors: surgeon gave her extra Doxy which she has been taking... . Assoc signs/symptoms: fever at home, no n/v, no CP  Past medical, social and family history reviewed: Past Medical History  Diagnosis Date  . Headache(784.0)     sinus  . Asthma     exercise-induced; prn inhaler  . Surgical wound, non healing 08/2013    left breast  . Seasonal allergies   . Insect bites 08/30/2013  . History of MRSA infection 2008    left leg   Past Surgical History  Procedure Laterality Date  . Tonsillectomy and adenoidectomy    . Wisdom tooth extraction    . Tonsillectomy    . Breast cyst excision Left 03/18/2013    Procedure: EXCISE CYST LEFT MEDIAL BREAST;  Surgeon: Velora Hecklerodd M Gerkin, MD;  Location: [facility];  Service: General;  Laterality: Left;  Marland Kitchen. Tympanostomy tube placement      x 3  . Mass excision Left 09/01/2013    Procedure: RE-EXCISION NON HEALING WOUND LEFT BREAST;  Surgeon: Velora Hecklerodd M Gerkin, MD;  Location: [facility];  Service: General;  Laterality: Left;   Social History  Substance Use Topics  . Smoking status: Never Smoker   . Smokeless tobacco: Never Used  . Alcohol Use: Yes     Comment: rarely   Family History  Problem Relation Age of Onset  . Hyperlipidemia Father   . Hypertension Father   . Depression Father   . Cancer Maternal Grandfather     leukemia  . Cancer Paternal Grandfather     non-hodgkins lymphoma  . Hypothyroidism Mother   . Stroke Maternal Grandmother   . Thyroid disease Mother     Current Outpatient Prescriptions  Medication Sig Dispense Refill  . albuterol  (PROVENTIL HFA;VENTOLIN HFA) 108 (90 BASE) MCG/ACT inhaler Inhale into the lungs every 6 (six) hours as needed for wheezing or shortness of breath.    Marland Kitchen. azelastine (ASTELIN) 0.1 % nasal spray Place 1-2 sprays into both nostrils 2 (two) times daily. Use in each nostril as directed 30 mL 6  . fexofenadine (ALLEGRA) 180 MG tablet Take 180 mg by mouth daily.    . fluticasone (FLONASE) 50 MCG/ACT nasal spray Place into both nostrils daily.    . Multiple Vitamin (MULTIVITAMIN) tablet Take 1 tablet by mouth daily.    . Naltrexone-Bupropion HCl ER 8-90 MG TB12 Start: 1 tab po qam x 1 week, then 1 tab po bid x 1 week, then 2 tabs po qam and 1 tab po qhs x 1 week, then 2 tabs po bid - MAX 4 TABS PER DAY 70 tablet 0   No current facility-administered medications for this visit.   Allergies  Allergen Reactions  . Polytrim [Polymyxin B-Trimethoprim] Other (See Comments)    OPHTHALMIC - FACIAL/EYE SWELLING      Review of Systems: CONSTITUTIONAL:  (+) fever, no chills, No  unintentional weight changes HEAD/EYES/EARS/NOSE/THROAT: No  headache, no vision change CARDIAC: No  chest pain, No  pressure,  RESPIRATORY: No  shortness of breath/wheeze GASTROINTESTINAL: No  nausea, No  Vomiting MUSCULOSKELETAL: No  myalgia/arthralgia  SKIN: (+) infection as above, no rash streaking, no other rash/wounds/concerning lesions HEM/ONC: No  abnormal lymph node  Exam:  BP 134/84 mmHg  Pulse 60  Ht  (1.575 m)  Wt 216 lb (97.977 kg)  BMI 39.50 kg/m2 Constitutional: VS see above. General Appearance: alert, well-developed, well-nourished, NAD Eyes: Normal lids and conjunctive, non-icteric sclera,  Ears, Nose, Mouth, Throat: MMM, Normal external inspection ears/nares/mouth/lips/gums, Neck: No masses, trachea midline Respiratory: Normal respiratory effort.  Skin: (+) indurated, erythematous area on L upper midline breast, (+) tenderness, no ulceration/drainage, otherwise skin is warm, dry, intact. No other  rash/ulcer. No concerning nevi or subq nodules on limited exam.   Psychiatric: Normal judgment/insight. Normal mood and affect.    ASSESSMENT/PLAN: Pt declines I&D - previous infections have drained on their own and she's rather not create more scar tissue problems in this area - see pt instructions, change abx, start warm compresses, seek care if persistent fever, worsening pain or other concerns. Advised of interaction btwn Contrave and Oxycodone may be less effective, hold Contrave (she is still at lowest dose)   Cellulitis of other specified site - L breast - warm compreses and antibiotics, will hold off on I&D at least for now per patient preference - Plan: oxyCODONE-acetaminophen (ROXICET) 5-325 MG tablet, clindamycin (CLEOCIN) 300 MG capsule  Obesity - on Contrave - Plan: Naltrexone-Bupropion HCl ER 8-90 MG TB12     All questions were answered. Visit summary with updated medication list and pertinent instructions was printed for patient. ER/RTC precautions were reviewed with the patient. Return in about 4 weeks (around 07/19/2015), or sooner if needed for skin infection, for weight check - nurse visit.

## 2015-06-21 NOTE — Patient Instructions (Signed)
Cellulitis Cellulitis is an infection of the skin and the tissue beneath it.   CAUSES  Cellulitis is caused by bacteria that enter the skin through cracks or cuts in the skin. The most common types of bacteria that cause cellulitis are staphylococci and streptococci. SIGNS AND SYMPTOMS   Redness and warmth.  Swelling.  Tenderness or pain.  Fever. DIAGNOSIS  Your health care provider can usually determine what is wrong based on a physical exam. Blood tests may also be done. TREATMENT  Treatment usually involves taking an antibiotic medicine. HOME CARE INSTRUCTIONS   Take your antibiotic medicine as directed by your health care provider. Finish the antibiotic even if you start to feel better.  Apply a warm cloth to the affected area up to 4 times per day to relieve pain.  Take medicines only as directed by your health care provider.  Keep all follow-up visits as directed by your health care provider. SEEK MEDICAL CARE IF:   You notice red streaks coming from the infected area.  Your red area gets larger or turns dark in color.  Your bone or joint underneath the infected area becomes painful after the skin has healed.  Your infection returns in the same area or another area.  You notice a swollen bump in the infected area.  You develop new symptoms.  You have a fever. SEEK IMMEDIATE MEDICAL CARE IF:   You feel very sleepy.  You develop vomiting or diarrhea.  You have a general ill feeling (malaise) with muscle aches and pains.   This information is not intended to replace advice given to you by your health care provider. Make sure you discuss any questions you have with your health care provider.   Document Released: 10/23/2004 Document Revised: 10/04/2014 Document Reviewed: 03/31/2011 Elsevier Interactive Patient Education [employer]2016 Elsevier Inc.

## 2015-06-22 ENCOUNTER — Ambulatory Visit: Payer: Self-pay

## 2015-07-09 ENCOUNTER — Telehealth: Payer: Self-pay

## 2015-07-09 DIAGNOSIS — E669 Obesity, unspecified: Secondary | ICD-10-CM

## 2015-07-09 NOTE — Telephone Encounter (Signed)
Pharmacy stated that in order for pt to use saving card for contrave it has be written for 120 tablets and 70 tablets. Please advise.

## 2015-07-09 NOTE — Telephone Encounter (Signed)
I did write for 70... 06/21/15 did she ever fill this?

## 2015-07-09 NOTE — Telephone Encounter (Signed)
Sorry it needs to be written for 120 tablets instead of 70 tablets

## 2015-07-10 ENCOUNTER — Encounter: Payer: Self-pay | Admitting: Osteopathic Medicine

## 2015-07-10 DIAGNOSIS — E669 Obesity, unspecified: Secondary | ICD-10-CM

## 2015-07-10 MED ORDER — NALTREXONE-BUPROPION HCL ER 8-90 MG PO TB12: 2.0000 | ORAL_TABLET | Freq: Two times a day (BID) | ORAL | Status: DC

## 2015-07-10 NOTE — Telephone Encounter (Signed)
Took care of this and sent my chart message to the patient.

## 2015-11-22 DIAGNOSIS — H5203 Hypermetropia, bilateral: Secondary | ICD-10-CM | POA: Diagnosis not present

## 2015-12-27 ENCOUNTER — Telehealth: Payer: 59 | Admitting: Physician Assistant

## 2015-12-27 DIAGNOSIS — H538 Other visual disturbances: Secondary | ICD-10-CM

## 2015-12-27 DIAGNOSIS — J019 Acute sinusitis, unspecified: Secondary | ICD-10-CM

## 2015-12-27 NOTE — Progress Notes (Signed)
Based on what you shared with me it looks like you have a serious condition that should be evaluated in a face to face office visit. You are mentioning blurry vision/vision changes. As such you need an examination to verify there is nothing more serious going on than a viral sinusitis.   If you are having a true medical emergency please call 911.  If you need an urgent face to face visit, [facility] has four urgent care centers for your convenience.  If you need care fast and have a high deductible or no insurance consider:   [email or web address]  [phone]  [address], [address] [place], [place] [address] 8 am to 8 pm Monday-Friday 10 am to 4 pm Saturday-Sunday   The following sites will take your  insurance:    . [facility]  [phone] Get Driving Directions Find a Provider at this Location  [address] [place], [place] [address] . 10 am to 8 pm Monday-Friday . 12 pm to 8 pm Saturday-Sunday   . [facility]  [phone] Get Driving Directions Find a Provider at this Location  [address] [place], [place] [address] . 8 am to 8 pm Monday-Friday . 9 am to 6 pm Saturday . 11 am to 6 pm Sunday   . [facility]  [phone] Get Driving Directions  [address] [place], [place] [address] . 8 am to 8 pm Monday-Friday . 8 am to 4 pm Saturday-Sunday   . [facility] (walk-ins welcome, or call for a scheduled time)  [phone]  Get Driving Directions Find a Provider at this Location  [address] [place], [place] [address] . 8 am to 8:30 pm Monday-Thursday . 8 am to 6 pm Friday . 8 am to 4 pm Saturday-Sunday   Your e-visit answers were reviewed by a board certified advanced clinical practitioner to complete your personal care plan.  Thank you for using e-Visits.

## 2015-12-31 ENCOUNTER — Encounter: Payer: Self-pay | Admitting: Osteopathic Medicine

## 2016-03-24 ENCOUNTER — Encounter: Payer: Self-pay | Admitting: Physician Assistant

## 2016-03-24 ENCOUNTER — Ambulatory Visit: Payer: Self-pay | Admitting: Physician Assistant

## 2016-03-24 VITALS — BP 114/74 | HR 63 | Temp 98.3°F

## 2016-03-24 DIAGNOSIS — H6983 Other specified disorders of Eustachian tube, bilateral: Secondary | ICD-10-CM

## 2016-03-24 DIAGNOSIS — H6993 Unspecified Eustachian tube disorder, bilateral: Secondary | ICD-10-CM

## 2016-03-24 DIAGNOSIS — J0100 Acute maxillary sinusitis, unspecified: Secondary | ICD-10-CM

## 2016-03-24 MED ORDER — AZITHROMYCIN 250 MG PO TABS: ORAL_TABLET | ORAL | 0 refills | Status: DC

## 2016-03-24 MED ORDER — PREDNISONE 10 MG PO TABS: 30.0000 mg | ORAL_TABLET | Freq: Every day | ORAL | 0 refills | Status: DC

## 2016-03-24 NOTE — Progress Notes (Signed)
S: C/o runny nose and congestion for 5-6 days, no fever, chills, cp/sob, v/d; mucus is green, gray, and thick, cough is sporadic, c/o of facial and dental pain. Some dizziness, not sure if that's from being on a recent cruise or from ear pressure  Using otc meds:   O: PE: vitals wnl, nad,  perrl eomi, normocephalic, tms dull, nasal mucosa red and swollen, throat injected, neck supple no lymph, lungs c t a, cv rrr, neuro intact  A:  Acute sinusitis, eustachean tube dysfunction  P: drink fluids, continue regular meds , use otc meds of choice, return if not improving in 5 days, return earlier if worsening , zpack, pred 30mg  qd, continue sudafed, switch to zyrtec

## 2016-04-09 ENCOUNTER — Encounter: Payer: Self-pay | Admitting: Physician Assistant

## 2016-04-09 ENCOUNTER — Ambulatory Visit: Payer: Self-pay | Admitting: Physician Assistant

## 2016-04-09 VITALS — BP 120/80 | HR 76 | Temp 98.2°F

## 2016-04-09 DIAGNOSIS — J0101 Acute recurrent maxillary sinusitis: Secondary | ICD-10-CM

## 2016-04-09 MED ORDER — PREDNISONE 10 MG PO TABS: 30.0000 mg | ORAL_TABLET | Freq: Every day | ORAL | 0 refills | Status: DC

## 2016-04-09 MED ORDER — AMOXICILLIN-POT CLAVULANATE 875-125 MG PO TABS: 1.0000 | ORAL_TABLET | Freq: Two times a day (BID) | ORAL | 0 refills | Status: DC

## 2016-04-09 NOTE — Progress Notes (Signed)
S: C/o runny nose and congestion for 5-6 days, got a little on the zpack but sx returned last week, ear pain never went away in the r ear,  no fever, chills, cp/sob, v/d; mucus is green and thick, cough is sporadic, c/o of facial and dental pain.   Using otc meds:   O: PE: vitals wnl, nad, perrl eomi, normocephalic, tms dull, nasal mucosa red and swollen, throat injected, neck supple no lymph, lungs c t a, cv rrr, neuro intact  A:  Acute sinusitis, ear pain   P: drink fluids, continue regular meds , use otc meds of choice, return if not improving in 5 days, return earlier if worsening , augmentin 875mg  bid, pred 30mg  qd x 3d, sudafed

## 2016-07-03 ENCOUNTER — Encounter: Payer: Self-pay | Admitting: Osteopathic Medicine

## 2016-07-07 ENCOUNTER — Ambulatory Visit (INDEPENDENT_AMBULATORY_CARE_PROVIDER_SITE_OTHER): Payer: 59 | Admitting: Osteopathic Medicine

## 2016-07-07 ENCOUNTER — Encounter: Payer: Self-pay | Admitting: Osteopathic Medicine

## 2016-07-07 VITALS — BP 143/91 | HR 98 | Ht 62.5 in | Wt 208.0 lb

## 2016-07-07 DIAGNOSIS — F411 Generalized anxiety disorder: Secondary | ICD-10-CM | POA: Diagnosis not present

## 2016-07-07 MED ORDER — SERTRALINE HCL 50 MG PO TABS: 50.0000 mg | ORAL_TABLET | Freq: Every day | ORAL | 1 refills | Status: DC

## 2016-07-07 MED ORDER — HYDROXYZINE HCL 50 MG PO TABS
25.0000 mg | ORAL_TABLET | Freq: Three times a day (TID) | ORAL | 1 refills | Status: DC | PRN
Start: 2016-07-07 — End: 2016-09-16

## 2016-07-07 NOTE — Progress Notes (Signed)
HPI: Deanna Gross is a 31 y.o. female  who presents to [facility] today, 07/07/16,  for chief complaint of:  Chief Complaint  Patient presents with  . Anxiety    New problem. Increasing anxiety over the past month or so. Significant irritability, somewhat paranoid thinking. Had history of anxiety previously, no history of mood disorder/bipolar. Newlywed. Trying to get pregnant. She thinks this may be stressing her out as well. Otherwise, good relationship with husband and good family support. No manic behaviors or impulsive behaviors. Crying easily when getting overwhelmed.    Past medical history, surgical history, social history and family history reviewed.  Patient Active Problem List   Diagnosis Date Noted  . Uses condoms for contraception 05/23/2015  . HPV in female 05/22/2015  . Obesity (BMI 30-39.9) 05/03/2015  . IT band syndrome 05/03/2015  . Recurrent knee pain 05/03/2015  . Rhinitis, allergic 05/03/2015  . Chronic infection of sinus 05/03/2015  . Wound healing, delayed 06/21/2013  . Cellulitis and abscess of trunk 02/10/2013    Current medication list and allergy/intolerance information reviewed.   Current Outpatient Prescriptions on File Prior to Visit  Medication Sig Dispense Refill  . albuterol (PROVENTIL HFA;VENTOLIN HFA) 108 (90 BASE) MCG/ACT inhaler Inhale into the lungs every 6 (six) hours as needed for wheezing or shortness of breath.    . fexofenadine (ALLEGRA) 180 MG tablet Take 180 mg by mouth daily.    . fluticasone (FLONASE) 50 MCG/ACT nasal spray Place into both nostrils daily.    . Multiple Vitamin (MULTIVITAMIN) tablet Take 1 tablet by mouth daily.     No current facility-administered medications on file prior to visit.    Allergies  Allergen Reactions  . Polytrim [Polymyxin B-Trimethoprim] Other (See Comments)    OPHTHALMIC - FACIAL/EYE SWELLING      Review of Systems:  Constitutional: No recent  illness  Cardiac: No  chest pain, No  pressure, No palpitations  Respiratory:  No  shortness of breath. No  Cough  Psychiatric: No  concerns with depression, +concerns with anxiety  Exam:  BP (!) 143/91   Pulse 98   Ht 5' 2.5" (1.588 m)   Wt 208 lb (94.3 kg)   BMI 37.44 kg/m   Constitutional: VS see above. General Appearance: alert, well-developed, well-nourished, NAD  Eyes: Normal lids and conjunctive, non-icteric sclera  Ears, Nose, Mouth, Throat: MMM, Normal external inspection ears/nares/mouth/lips/gums.  Neck: No masses, trachea midline.   Respiratory: Normal respiratory effort.  Musculoskeletal: Gait normal. Symmetric and independent movement of all extremities  Psychiatric: Normal judgment/insight. Normal mood and affect. Oriented x3. No thought disorder. No SI/HI.      GAD 7 : Generalized Anxiety Score 07/07/2016  Nervous, Anxious, on Edge 3  Control/stop worrying 3  Worry too much - different things 3  Trouble relaxing 3  Restless 3  Easily annoyed or irritable 3  Afraid - awful might happen 3  Total GAD 7 Score 21    Depression screen PHQ 2/9 07/07/2016  Decreased Interest 1  Down, Depressed, Hopeless 3  PHQ - 2 Score 4  Altered sleeping 3  Tired, decreased energy 3  Change in appetite 2  Feeling bad or failure about yourself  3  Trouble concentrating 3  Moving slowly or fidgety/restless 2  Suicidal thoughts 0  PHQ-9 Score 20      ASSESSMENT/PLAN:   Since trying to get pregnant, we'll go ahead and start Zoloft with as needed hydroxyzine. Patient educated on  possible side effects of medication and to alert the if any concerns.   Baseline labs last year normal, consider repeat if no better (TSH, etc).   Generalized anxiety disorder - Plan: sertraline (ZOLOFT) 50 MG tablet, hydrOXYzine (ATARAX/VISTARIL) 50 MG tablet    Patient Instructions  We are starting a medication today called Zoloft to help treat your anxiety. This is a daily medication  to help control your symptoms. I also highly encourage my patients who are suffering from anxiety to seek care with a counselor or therapist. A therapist can coach you in techniques to recognize and deal with troubling thought patterns and behaviors. The ability to cope with external stressors is crucial to overall mental health.   Try the as-needed medicine up to three times per day, try half dose first as this drug can cause sleepiness.   Let's plan to follow up in the office in 4 weeks. At that time, we can talk about how well the medicine is working for you, and we can consider increasing the dose, adding another medicine, etc.   If you experience problematic side effects, please let me know ASAP - we can switch the medicine any time, and we don't need an appointment for this.   I have placed a referral to behavioral health for counseling/therapy. Please let us know if you don't hear back about that referral.   Any questions or concerns, call me!      Follow-up plan: Return in about 4 weeks (around 08/04/2016) for recheck on medications .  Visit summary with medication list and pertinent instructions was printed for patient to review, alert [place] if any changes needed. All questions at time of visit were answered - patient instructed to contact office with any additional concerns. ER/RTC precautions were reviewed with the patient and understanding verbalized.

## 2016-07-07 NOTE — Patient Instructions (Signed)
We are starting a medication today called Zoloft to help treat your anxiety. This is a daily medication to help control your symptoms. I also highly encourage my patients who are suffering from anxiety to seek care with a counselor or therapist. A therapist can coach you in techniques to recognize and deal with troubling thought patterns and behaviors. The ability to cope with external stressors is crucial to overall mental health.   Try the as-needed medicine up to three times per day, try half dose first as this drug can cause sleepiness.   Let's plan to follow up in the office in 4 weeks. At that time, we can talk about how well the medicine is working for you, and we can consider increasing the dose, adding another medicine, etc.   If you experience problematic side effects, please let me know ASAP - we can switch the medicine any time, and we don't need an appointment for this.   I have placed a referral to behavioral health for counseling/therapy. Please let us know if you don't hear back about that referral.   Any questions or concerns, call me!

## 2016-07-11 NOTE — Addendum Note (Signed)
Addended by: Deirdre PippinsALEXANDER, Aman Batley M on: 07/11/2016 01:06 PM   Modules accepted: Orders

## 2016-08-04 ENCOUNTER — Ambulatory Visit (INDEPENDENT_AMBULATORY_CARE_PROVIDER_SITE_OTHER): Payer: 59 | Admitting: Osteopathic Medicine

## 2016-08-04 ENCOUNTER — Encounter: Payer: Self-pay | Admitting: Osteopathic Medicine

## 2016-08-04 DIAGNOSIS — F411 Generalized anxiety disorder: Secondary | ICD-10-CM | POA: Diagnosis not present

## 2016-08-04 MED ORDER — SERTRALINE HCL 50 MG PO TABS: 50.0000 mg | ORAL_TABLET | Freq: Every day | ORAL | 3 refills | Status: DC

## 2016-08-04 NOTE — Progress Notes (Signed)
HPI: Deanna Gross is a 31 y.o. female  who [facility] today, 08/04/16,  for chief complaint of:  Chief Complaint  Patient presents with  . Anxiety    Following up today after starting Zoloft at last visit 07/07/16. At that time was noticing increasing anxiety over the past month or so. Significant irritability, somewhat paranoid thinking. Had history of anxiety previously, no history of mood disorder/bipolar. Newlywed. Trying to get pregnant. She thinks this may be stressing her out as well. Otherwise, good relationship with husband and good family support. No manic behaviors or impulsive behaviors. Crying easily when getting overwhelmed.   Today, repeat self-scoring questionnaire is much improved and patient feels better on full dose Zoloft. Some nausea initially which has since resolved. Would like to stay on the medication.    Past medical history, surgical history, social history and family history reviewed.  Patient Active Problem List   Diagnosis Date Noted  . Uses condoms for contraception 05/23/2015  . HPV in female 05/22/2015  . Obesity (BMI 30-39.9) 05/03/2015  . IT band syndrome 05/03/2015  . Recurrent knee pain 05/03/2015  . Rhinitis, allergic 05/03/2015  . Chronic infection of sinus 05/03/2015  . Wound healing, delayed 06/21/2013  . Cellulitis and abscess of trunk 02/10/2013    Current medication list and allergy/intolerance information reviewed.   Current Outpatient Prescriptions on File Prior to Visit  Medication Sig Dispense Refill  . albuterol (PROVENTIL HFA;VENTOLIN HFA) 108 (90 BASE) MCG/ACT inhaler Inhale into the lungs every 6 (six) hours as needed for wheezing or shortness of breath.    . fexofenadine (ALLEGRA) 180 MG tablet Take 180 mg by mouth daily.    . fluticasone (FLONASE) 50 MCG/ACT nasal spray Place into both nostrils daily.    . hydrOXYzine (ATARAX/VISTARIL) 50 MG tablet Take 0.5-1 tablets (25-50 mg  total) by mouth every 8 (eight) hours as needed. For itching. 60 tablet 1  . Multiple Vitamin (MULTIVITAMIN) tablet Take 1 tablet by mouth daily.    . sertraline (ZOLOFT) 50 MG tablet Take 1 tablet (50 mg total) by mouth at bedtime. 30 tablet 1   No current facility-administered medications on file prior to visit.    Allergies  Allergen Reactions  . Polytrim [Polymyxin B-Trimethoprim] Other (See Comments)    OPHTHALMIC - FACIAL/EYE SWELLING      Review of Systems:  Constitutional: No recent illness  Cardiac: No  chest pain, No  pressure, No palpitations  Respiratory:  No  shortness of breath. No  Cough  Psychiatric: No  concerns with depression, +concerns with anxiety but overall improved   Exam:  BP 138/70   Pulse (!) 59   Wt 207 lb (93.9 kg)   BMI 37.26 kg/m   Constitutional: VS see above. General Appearance: alert, well-developed, well-nourished, NAD  Eyes: Normal lids and conjunctive, non-icteric sclera  Ears, Nose, Mouth, Throat: MMM, Normal external inspection ears/nares/mouth/lips/gums.  Neck: No masses, trachea midline.   Respiratory: Normal respiratory effort.  Musculoskeletal: Gait normal. Symmetric and independent movement of all extremities  Psychiatric: Normal judgment/insight. Normal mood and affect. Oriented x3. No thought disorder. No SI/HI.      GAD 7 : Generalized Anxiety Score 08/04/2016 07/07/2016  Nervous, Anxious, on Edge 1 3  Control/stop worrying 2 3  Worry too much - different things 2 3  Trouble relaxing 1 3  Restless 0 3  Easily annoyed or irritable 1 3  Afraid - awful might happen 2 3  Total GAD 7 Score 9 21    Depression screen [facility] 2/9 08/04/2016 07/07/2016  Decreased Interest 0 1  Down, Depressed, Hopeless 0 3  PHQ - 2 Score 0 4  Altered sleeping 1 3  Tired, decreased energy 0 3  Change in appetite 0 2  Feeling bad or failure about yourself  1 3  Trouble concentrating 0 3  Moving slowly or fidgety/restless 0 2  Suicidal  thoughts 0 0  PHQ-9 Score 2 20      ASSESSMENT/PLAN:   Doing well, 1 year of meds Rx provided, pt is going to counseling soon and overall doing well on the medication  Disucssed continue x6 mos or so ideally before tapering off if desired, or can continue indefinitely.   Generalized anxiety disorder - Plan: sertraline (ZOLOFT) 50 MG tablet      Follow-up plan: Return for ANNUAL AND PAP NEXT FEW MONTHS .  Visit summary with medication list and pertinent instructions was printed for patient to review, alert [place] if any changes needed. All questions at time of visit were answered - patient instructed to contact office with any additional concerns. ER/RTC precautions were reviewed with the patient and understanding verbalized.

## 2016-08-14 ENCOUNTER — Encounter (HOSPITAL_COMMUNITY): Payer: Self-pay

## 2016-08-14 ENCOUNTER — Ambulatory Visit (INDEPENDENT_AMBULATORY_CARE_PROVIDER_SITE_OTHER): Payer: 59 | Admitting: Licensed Clinical Social Worker

## 2016-08-14 DIAGNOSIS — F411 Generalized anxiety disorder: Secondary | ICD-10-CM

## 2016-08-14 NOTE — Progress Notes (Signed)
Therapist learned that patient is a [employer] employee.  She informed patient of the opportunity to get free counseling through the Employee Assistance Counseling Program.  Called to schedule her an appointment with them.  There was no answer.  Left a message with patient's name and phone number requesting they call to schedule a therapy appointment.

## 2016-08-18 ENCOUNTER — Encounter: Payer: Self-pay | Admitting: Osteopathic Medicine

## 2016-08-18 ENCOUNTER — Other Ambulatory Visit (HOSPITAL_COMMUNITY)
Admission: RE | Admit: 2016-08-18 | Discharge: 2016-08-18 | Disposition: A | Discharge status: Home / Self Care | Payer: 59 | Source: Ambulatory Visit | Attending: Osteopathic Medicine | Admitting: Osteopathic Medicine

## 2016-08-18 ENCOUNTER — Ambulatory Visit (INDEPENDENT_AMBULATORY_CARE_PROVIDER_SITE_OTHER): Payer: 59 | Admitting: Osteopathic Medicine

## 2016-08-18 VITALS — BP 107/73 | HR 80 | Ht 62.5 in | Wt 202.0 lb

## 2016-08-18 DIAGNOSIS — R87810 Cervical high risk human papillomavirus (HPV) DNA test positive: Secondary | ICD-10-CM

## 2016-08-18 DIAGNOSIS — Z01419 Encounter for gynecological examination (general) (routine) without abnormal findings: Secondary | ICD-10-CM | POA: Diagnosis not present

## 2016-08-18 DIAGNOSIS — Z0000 Encounter for general adult medical examination without abnormal findings: Secondary | ICD-10-CM

## 2016-08-18 DIAGNOSIS — R87610 Atypical squamous cells of undetermined significance on cytologic smear of cervix (ASC-US): Secondary | ICD-10-CM | POA: Insufficient documentation

## 2016-08-18 DIAGNOSIS — R87620 Atypical squamous cells of undetermined significance on cytologic smear of vagina (ASC-US): Secondary | ICD-10-CM

## 2016-08-18 DIAGNOSIS — R87811 Vaginal high risk human papillomavirus (HPV) DNA test positive: Secondary | ICD-10-CM

## 2016-08-18 NOTE — Progress Notes (Signed)
HPI: Deanna Gross is a 31 y.o. female  who presents to [facility] today, 08/18/16,  for chief complaint of:  Chief Complaint  Patient presents with  . Annual Exam  . Gynecologic Exam    Patient here for annual physical / wellness exam.  See preventive care reviewed as below.  Recent labs reviewed in detail with the patient.   Additional concerns today include:  None major Still trying to get pregnant Doing well on Zoloft Didn't get along well with the counselor downstairs, is seeking counseling elsewhere.    Past medical, surgical, social and family history reviewed: Patient Active Problem List   Diagnosis Date Noted  . Uses condoms for contraception 05/23/2015  . HPV in female 05/22/2015  . Obesity (BMI 30-39.9) 05/03/2015  . IT band syndrome 05/03/2015  . Recurrent knee pain 05/03/2015  . Rhinitis, allergic 05/03/2015  . Chronic infection of sinus 05/03/2015  . Wound healing, delayed 06/21/2013  . Cellulitis and abscess of trunk 02/10/2013   Past Surgical History:  Procedure Laterality Date  . BREAST CYST EXCISION Left 03/18/2013   Procedure: EXCISE CYST LEFT MEDIAL BREAST;  Surgeon: Velora Heckler, MD;  Location: [facility];  Service: General;  Laterality: Left;  Marland Kitchen MASS EXCISION Left 09/01/2013   Procedure: RE-EXCISION NON HEALING WOUND LEFT BREAST;  Surgeon: Velora Heckler, MD;  Location: [facility];  Service: General;  Laterality: Left;  . TONSILLECTOMY    . TONSILLECTOMY AND ADENOIDECTOMY    . TYMPANOSTOMY TUBE PLACEMENT     x 3  . WISDOM TOOTH EXTRACTION     Social History  Substance Use Topics  . Smoking status: Never Smoker  . Smokeless tobacco: Never Used  . Alcohol use Yes     Comment: rarely   Family History  Problem Relation Age of Onset  . Hyperlipidemia Father   . Hypertension Father   . Depression Father   . Cancer Maternal Grandfather        leukemia  . Cancer Paternal  Grandfather        non-hodgkins lymphoma  . Hypothyroidism Mother   . Stroke Maternal Grandmother   . Thyroid disease Mother      Current medication list and allergy/intolerance information reviewed:   Current Outpatient Prescriptions  Medication Sig Dispense Refill  . albuterol (PROVENTIL HFA;VENTOLIN HFA) 108 (90 BASE) MCG/ACT inhaler Inhale into the lungs every 6 (six) hours as needed for wheezing or shortness of breath.    . fexofenadine (ALLEGRA) 180 MG tablet Take 180 mg by mouth daily.    . fluticasone (FLONASE) 50 MCG/ACT nasal spray Place into both nostrils daily.    . hydrOXYzine (ATARAX/VISTARIL) 50 MG tablet Take 0.5-1 tablets (25-50 mg total) by mouth every 8 (eight) hours as needed. For itching. 60 tablet 1  . Multiple Vitamin (MULTIVITAMIN) tablet Take 1 tablet by mouth daily.    . sertraline (ZOLOFT) 50 MG tablet Take 1 tablet (50 mg total) by mouth at bedtime. 90 tablet 3   No current facility-administered medications for this visit.    Allergies  Allergen Reactions  . Polytrim [Polymyxin B-Trimethoprim] Other (See Comments)    OPHTHALMIC - FACIAL/EYE SWELLING      Review of Systems:  Constitutional:  No  fever, no chills, No recent illness  HEENT: No  headache, no vision change  Cardiac: No  chest pain, No  pressure, No palpitations,   Respiratory:  No  shortness of breath. No  Cough  Gastrointestinal: No  abdominal pain, No  nausea, No  vomiting  Musculoskeletal: No new myalgia/arthralgia  Genitourinary: No  incontinence, No  abnormal genital bleeding, No abnormal genital discharge  Skin: No  Rash, No other wounds/concerning lesions  Hem/Onc: No  easy bruising/bleeding, No  abnormal lymph node  Endocrine: No cold intolerance,  No heat intolerance.   Neurologic: No  weakness, No  dizziness,  Psychiatric: No  concerns with depression, No  concerns with anxiety, No sleep problems, No mood problems  Exam:  BP 107/73   Pulse 80   Ht 5' 2.5" (1.588  m)   Wt 202 lb (91.6 kg)   BMI 36.36 kg/m   Constitutional: VS see above. General Appearance: alert, well-developed, well-nourished, NAD  Eyes: Normal lids and conjunctive, non-icteric sclera  Ears, Nose, Mouth, Throat: MMM, Normal external inspection ears/nares/mouth/lips/gums. T  Neck: No masses, trachea midline. No thyroid enlargement.   Respiratory: Normal respiratory effort. no wheeze, no rhonchi, no rales  Cardiovascular: S1/S2 normal, no murmur, no rub/gallop auscultated. RRR. No lower extremity edema.   Gastrointestinal: Nontender, no masses. No hepatomegaly, no splenomegaly. No hernia appreciated. Bowel sounds normal. Rectal exam deferred.   Musculoskeletal: Gait normal. No clubbing/cyanosis of digits.   Neurological: Normal balance/coordination. No tremor.   Skin: warm, dry, intact. No rash/ulcer. No concerning nevi or subq nodules on limited exam.    Psychiatric: Normal judgment/insight. Normal mood and affect. Oriented x3.  GYN: No lesions/ulcers to external genitalia, normal urethra, normal vaginal mucosa, physiologic discharge, cervix normal without lesions, smal blood at cervical os, uterus not enlarged or tender, adnexa no masses and nontender  BREAST: No rashes/skin changes, normal fibrous breast tissue, no masses or tenderness, normal nipple without discharge, normal axilla     ASSESSMENT/PLAN: Repeat Pap given Hx abn, and since tring to get pregnant. ?blood at cervical os either inter-menstrual spotting or implantation bleeding? No missed period yet, ovulation about last week per patient. Advised I don't think we need blood work based on low risk and no strong family history, pt declines labs today  Annual physical exam  Cervical high risk human papillomavirus (HPV) DNA test positive - Plan: Cytology - PAP   FEMALE PREVENTIVE CARE Updated 08/18/16   ANNUAL SCREENING/COUNSELING  Diet/Exercise - HEALTHY HABITS DISCUSSED TO DECREASE CV RISK History   Smoking Status  . Never Smoker  Smokeless Tobacco  . Never Used   History  Alcohol Use  . Yes    Comment: rarely   Depression screen PHQ 2/9 08/18/2016  Decreased Interest 0  Down, Depressed, Hopeless 0  PHQ - 2 Score 0  Altered sleeping -  Tired, decreased energy -  Change in appetite -  Feeling bad or failure about yourself  -  Trouble concentrating -  Moving slowly or fidgety/restless -  Suicidal thoughts -  PHQ-9 Score -    Domestic violence concerns - no  HTN SCREENING - SEE VITALS  SEXUAL HEALTH  Sexually active in the past year - Yes with female.  Need/want STI testing today? - no  Concerns about libido or pain with sex? - no  Plans for pregnancy? - trying   INFECTIOUS DISEASE SCREENING  HIV - does not need  GC/CT - does not need  HepC - DOB [birth year] - does not need  TB - does not need  DISEASE SCREENING  Lipid - does not need  DM2 - does not need  Osteoporosis - women age 37+ - does not need  CANCER SCREENING  Cervical - needs  Breast - does not need  Lung - does not need  Colon - does not need  ADULT VACCINATION  Influenza - annual vaccine recommended  Td - booster every 10 years   Zoster - option at 50, yes at 60+   PCV13 - was not indicated  PPSV23 - was not indicated  There is no immunization history on file for this patient.  OTHER  Fall - exercise and Vit D age 83+ - does not need  Consider ASA - age 96-59 - does not need    Visit summary with medication list and pertinent instructions was printed for patient to review. All questions at time of visit were answered - patient instructed to contact office with any additional concerns. ER/RTC precautions were reviewed with the patient. Follow-up plan: Return in about 6 months (around 02/18/2017) for recheck on Zoloft, sooner if needed.

## 2016-08-21 LAB — CYTOLOGY - PAP
Diagnosis: UNDETERMINED — AB
HPV (WINDOPATH): DETECTED — AB
HPV 16/18/45 GENOTYPING: NEGATIVE

## 2016-08-21 NOTE — Addendum Note (Signed)
Addended by: Deirdre PippinsALEXANDER, Finneus Kaneshiro M on: 08/21/2016 04:28 PM   Modules accepted: Orders

## 2016-08-27 ENCOUNTER — Telehealth: Payer: Self-pay

## 2016-08-27 NOTE — Telephone Encounter (Signed)
Left message for patient to call office. Received referral from primary care.

## 2016-09-16 ENCOUNTER — Encounter: Payer: Self-pay | Admitting: Obstetrics and Gynecology

## 2016-09-16 ENCOUNTER — Encounter: Payer: Self-pay | Admitting: *Deleted

## 2016-09-16 ENCOUNTER — Ambulatory Visit (INDEPENDENT_AMBULATORY_CARE_PROVIDER_SITE_OTHER): Payer: 59 | Admitting: Obstetrics and Gynecology

## 2016-09-16 VITALS — BP 136/85 | HR 71 | Resp 16 | Ht 62.5 in

## 2016-09-16 DIAGNOSIS — R87810 Cervical high risk human papillomavirus (HPV) DNA test positive: Secondary | ICD-10-CM

## 2016-09-16 DIAGNOSIS — A630 Anogenital (venereal) warts: Secondary | ICD-10-CM | POA: Diagnosis not present

## 2016-09-16 DIAGNOSIS — R87610 Atypical squamous cells of undetermined significance on cytologic smear of cervix (ASC-US): Secondary | ICD-10-CM

## 2016-09-16 DIAGNOSIS — Z3202 Encounter for pregnancy test, result negative: Secondary | ICD-10-CM

## 2016-09-16 LAB — POCT URINE PREGNANCY: Preg Test, Ur: NEGATIVE

## 2016-09-16 NOTE — Progress Notes (Signed)
31 yo with ASCUS + HPV on 07/2016 pap smear here for colposcopy  Patient given informed consent, signed copy in the chart, time out was performed.  Placed in lithotomy position. Cervix viewed with speculum and colposcope after application of acetic acid.   Colposcopy adequate?  yes Acetowhite lesions? Yes at 5 o'clock Punctation? no Mosaicism?  no Abnormal vasculature?  no Biopsies? Yes 5 o'clock ECC? yes  COMMENTS:  Patient was given post procedure instructions.  She will return in 2 weeks for results.  Catalina Antigua, MD

## 2016-09-23 ENCOUNTER — Telehealth: Payer: Self-pay | Admitting: *Deleted

## 2016-09-23 NOTE — Telephone Encounter (Signed)
Pt notified of neg Colpo biopsy and will need a f/u pap in 1 year per Dr Jolayne Panther

## 2016-09-23 NOTE — Telephone Encounter (Signed)
-----   Message from Catalina Antigua, MD sent at 09/22/2016  6:39 PM EDT ----- Please inform patient of negative colpo biopsy results. Plan is to repeat pap smear in 1 year  Thanks  Peggy

## 2016-10-01 ENCOUNTER — Emergency Department: Payer: 59

## 2016-10-01 ENCOUNTER — Encounter: Payer: Self-pay | Admitting: *Deleted

## 2016-10-01 ENCOUNTER — Emergency Department
Admission: EM | Admit: 2016-10-01 | Discharge: 2016-10-01 | Disposition: A | Discharge status: Home / Self Care | Payer: 59 | Attending: Emergency Medicine | Admitting: Emergency Medicine

## 2016-10-01 DIAGNOSIS — R002 Palpitations: Secondary | ICD-10-CM | POA: Insufficient documentation

## 2016-10-01 DIAGNOSIS — R202 Paresthesia of skin: Secondary | ICD-10-CM | POA: Insufficient documentation

## 2016-10-01 DIAGNOSIS — Z79899 Other long term (current) drug therapy: Secondary | ICD-10-CM | POA: Diagnosis not present

## 2016-10-01 DIAGNOSIS — R42 Dizziness and giddiness: Secondary | ICD-10-CM | POA: Diagnosis present

## 2016-10-01 DIAGNOSIS — J45990 Exercise induced bronchospasm: Secondary | ICD-10-CM | POA: Diagnosis not present

## 2016-10-01 DIAGNOSIS — R079 Chest pain, unspecified: Secondary | ICD-10-CM | POA: Diagnosis not present

## 2016-10-01 LAB — CBC
HCT: 40.7 % (ref 35.0–47.0)
Hemoglobin: 14.2 g/dL (ref 12.0–16.0)
MCH: 30.1 pg (ref 26.0–34.0)
MCHC: 34.8 g/dL (ref 32.0–36.0)
MCV: 86.3 fL (ref 80.0–100.0)
PLATELETS: 228 10*3/uL (ref 150–440)
RBC: 4.72 MIL/uL (ref 3.80–5.20)
RDW: 13.4 % (ref 11.5–14.5)
WBC: 7.8 10*3/uL (ref 3.6–11.0)

## 2016-10-01 LAB — BASIC METABOLIC PANEL
ANION GAP: 9 (ref 5–15)
BUN: 19 mg/dL (ref 6–20)
CO2: 25 mmol/L (ref 22–32)
Calcium: 9.5 mg/dL (ref 8.9–10.3)
Chloride: 105 mmol/L (ref 101–111)
Creatinine, Ser: 0.71 mg/dL (ref 0.44–1.00)
Glucose, Bld: 92 mg/dL (ref 65–99)
Potassium: 3.5 mmol/L (ref 3.5–5.1)
SODIUM: 139 mmol/L (ref 135–145)

## 2016-10-01 LAB — URINALYSIS, COMPLETE (UACMP) WITH MICROSCOPIC
BILIRUBIN URINE: NEGATIVE
Bacteria, UA: NONE SEEN
GLUCOSE, UA: NEGATIVE mg/dL
HGB URINE DIPSTICK: NEGATIVE
Ketones, ur: 5 mg/dL — AB
Leukocytes, UA: NEGATIVE
Nitrite: NEGATIVE
PH: 6 (ref 5.0–8.0)
Protein, ur: NEGATIVE mg/dL
SPECIFIC GRAVITY, URINE: 1.003 — AB (ref 1.005–1.030)

## 2016-10-01 LAB — HCG, QUANTITATIVE, PREGNANCY: hCG, Beta Chain, Quant, S: <1 m[IU]/mL (ref <5)

## 2016-10-01 LAB — TSH: TSH: 2.196 u[IU]/mL (ref 0.350–4.500)

## 2016-10-01 LAB — TROPONIN I: Troponin I: <0.03 ng/mL (ref <0.03)

## 2016-10-01 NOTE — ED Triage Notes (Signed)
Pt states 2 nights ago she felt as if her heart was racing, states after lying down she felt better, states today she developed tingling in both arms with chest tightness and feeling as if she was going to pass out, states hx of anxiety, at present awake and alert in no acute distress

## 2016-10-01 NOTE — ED Notes (Signed)
Pt ambulatory to toilet without difficulty. 

## 2016-10-01 NOTE — ED Provider Notes (Signed)
[facility] Emergency Department Provider Note  ____________________________________________  Time seen: Approximately 11:41 AM  I have reviewed the triage vital signs and the nursing notes.   HISTORY  Chief Complaint Dizziness and Tingling   HPI Deanna Gross is a 31 y.o. female with history of anxiety who presents for evaluation of palpitations. Patient reports that 4 days ago she was at home when she started feeling palpitations. She laid on the ground elevating her legs for 30 minutes and it went away. She then developed a mild central dull chest discomfort has been constant for the last few days. 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 She was noted to be pale by some of her colleagues, she felt dizzy like she was going to pass out and started feeling tingling sensation in her arms and legs. The episode lasted several minutes but has now resolved. She continues to endorse persistent discomfort in the chest for the last 4 days. She is not a smoker. No personal or family history of ischemic heart disease or blood clots, no recent travel or immobilization, no leg pain or swelling, no exogenous hormones, no hemoptysis. Patient has had a congestion but no cough or fever, no body aches. No abdominal pain, no nausea or vomiting. She is currently on her menstrual period.. She denies alcohol or drugs. Patient report one cup of coffee a day, no power drinks or caffeine pills.  Past Medical History:  Diagnosis Date  . Asthma    exercise-induced; prn inhaler  . Headache(784.0)    sinus  . History of MRSA infection 2008   left leg  . Insect bites 08/30/2013  . Seasonal allergies   . Surgical wound, non healing 08/2013   left breast    Patient Active Problem List   Diagnosis Date Noted  . Uses condoms for contraception 05/23/2015  . HPV in female 05/22/2015  . Obesity (BMI 30-39.9)  05/03/2015  . IT band syndrome 05/03/2015  . Recurrent knee pain 05/03/2015  . Rhinitis, allergic 05/03/2015  . Chronic infection of sinus 05/03/2015  . Wound healing, delayed 06/21/2013  . Cellulitis and abscess of trunk 02/10/2013    Past Surgical History:  Procedure Laterality Date  . BREAST CYST EXCISION Left 03/18/2013   Procedure: EXCISE CYST LEFT MEDIAL BREAST;  Surgeon: Velora Heckler, MD;  Location: [facility];  Service: General;  Laterality: Left;  Marland Kitchen MASS EXCISION Left 09/01/2013   Procedure: RE-EXCISION NON HEALING WOUND LEFT BREAST;  Surgeon: Velora Heckler, MD;  [facility];  Service: General;  Laterality: Left;  . TONSILLECTOMY    . TONSILLECTOMY AND ADENOIDECTOMY    . TYMPANOSTOMY TUBE PLACEMENT     x 3  . WISDOM TOOTH EXTRACTION      Prior to Admission medications   Medication Sig Start Date End Date Taking? Authorizing Provider  albuterol (PROVENTIL HFA;VENTOLIN HFA) 108 (90 BASE) MCG/ACT inhaler Inhale into the lungs every 6 (six) hours as needed for wheezing or shortness of breath.    [provider]  fexofenadine (ALLEGRA) 180 MG tablet Take 180 mg by mouth daily.    [provider]  fluticasone (FLONASE) 50 MCG/ACT nasal spray Place into both nostrils daily.    [provider]  Prenatal MV-Min-Fe Fum-FA-DHA (PRENATAL 1 PO) Take by mouth.    [provider]  Prenatal Vit-Fe Fumarate-FA (PRENATAL 1+1 PO) Take  by mouth.    [provider]  sertraline (ZOLOFT) 50 MG tablet Take 1 tablet (50 mg total) by mouth at bedtime. 08/04/16   Sunnie NielsenAlexander, Natalie, DO    Allergies Polytrim [polymyxin b-trimethoprim]  Family History  Problem Relation Age of Onset  . Hyperlipidemia Father   . Hypertension Father   . Depression Father   . Hypothyroidism Mother   . Thyroid disease Mother   . Cancer Maternal Grandfather        leukemia  . Cancer Paternal Grandfather        non-hodgkins lymphoma  .  Stroke Maternal Grandmother     Social History Social History  Substance Use Topics  . Smoking status: Never Smoker  . Smokeless tobacco: Never Used  . Alcohol use Yes     Comment: rarely    Review of Systems  Constitutional: Negative for fever. Eyes: Negative for visual changes. ENT: Negative for sore throat. Neck: No neck pain  Cardiovascular: + chest pain and palpitations Respiratory: Negative for shortness of breath. Gastrointestinal: Negative for abdominal pain, vomiting or diarrhea. Genitourinary: Negative for dysuria. Musculoskeletal: Negative for back pain. Skin: Negative for rash. Neurological: Negative for headaches, weakness or numbness. Psych: No SI or HI  ____________________________________________   PHYSICAL EXAM:  VITAL SIGNS: ED Triage Vitals  Enc Vitals Group     BP 10/01/16 1117 140/78     Pulse Rate 10/01/16 1117 61     Resp 10/01/16 1117 18     Temp 10/01/16 1117 98 F (36.7 C)     Temp Source 10/01/16 1117 Oral     SpO2 10/01/16 1117 100 %     Weight 10/01/16 1118 196 lb (88.9 kg)     Height 10/01/16 1118 5\' 2"  (1.575 m)     Head Circumference --      Peak Flow --      Pain Score 10/01/16 1117 3     Pain Loc --      Pain Edu? --      Excl. in GC? --     Constitutional: Alert and oriented. Well appearing and in no apparent distress. HEENT:      Head: Normocephalic and atraumatic.         Eyes: Conjunctivae are normal. Sclera is non-icteric.       Mouth/Throat: Mucous membranes are moist.       Neck: Supple with no signs of meningismus. Cardiovascular: Regular rate and rhythm. No murmurs, gallops, or rubs. 2+ symmetrical distal pulses are present in all extremities. No JVD. Respiratory: Normal respiratory effort. Lungs are clear to auscultation bilaterally. No wheezes, crackles, or rhonchi.  Gastrointestinal: Soft, non tender, and non distended with positive bowel sounds. No rebound or guarding. Genitourinary: No CVA  tenderness. Musculoskeletal: Nontender with normal range of motion in all extremities. No edema, cyanosis, or erythema of extremities. Neurologic: Normal speech and language. Face is symmetric. Moving all extremities. No gross focal neurologic deficits are appreciated. Skin: Skin is warm, dry and intact. No rash noted. Psychiatric: Mood and affect are normal. Speech and behavior are normal.  ____________________________________________   LABS (all labs ordered are listed, but only abnormal results are displayed)  Labs Reviewed  URINALYSIS, COMPLETE (UACMP) WITH MICROSCOPIC - Abnormal; Notable for the following:       Result Value   Color, Urine COLORLESS (*)    APPearance CLEAR (*)    Specific Gravity, Urine 1.003 (*)    Ketones, ur 5 (*)    Squamous Epithelial /  LPF 0-5 (*)    All other components within normal limits  CBC  BASIC METABOLIC PANEL  TSH  HCG, QUANTITATIVE, PREGNANCY  TROPONIN I   ____________________________________________  EKG  ED ECG REPORT I, Nita Sickle, the attending physician, personally viewed and interpreted this ECG.  Normal sinus rhythm, rate of 61, normal intervals, normal axis, no ST elevations or depressions, T-wave inversions in 3 and aVF. No prior for comparison.  ____________________________________________  RADIOLOGY  CXR: negative ____________________________________________   PROCEDURES  Procedure(s) performed: None Procedures Critical Care performed:  None ____________________________________________   INITIAL IMPRESSION / ASSESSMENT AND PLAN / ED COURSE  31 y.o. female with history of anxiety who presents for evaluation of palpitations.this is patient's second episode in 4 days. She also has a dull mild chest discomfort, not pleuritic in nature. PERC negative. EKG with no ischemic changes, no prolonged QT, no HOCM, no WPW, no brugada. Exam with no acute findings. WIll check pregnancy test, labs to eval for anemia,  electrolyte abnormalities, thyroid. Will monitor on telemetry in case patient has recurrent episode of arrhythmia.    _________________________ 1:06 PM on 10/01/2016 -----------------------------------------  Labs with no acute findings. Patient remains stable with no further episodes in the ED. Will refer to Dr. Gwen Pounds for further management. Recommended returning to the ER for new episodes of palpitations, chest pain, dizziness, or shortness of breath.  Pertinent labs & imaging results that were available during my care of the patient were reviewed by me and considered in my medical decision making (see chart for details).    ____________________________________________   FINAL CLINICAL IMPRESSION(S) / ED DIAGNOSES  Final diagnoses:  Palpitation      NEW MEDICATIONS STARTED DURING THIS VISIT:  New Prescriptions   No medications on file     Note:  This document was prepared using Dragon voice recognition software and may include unintentional dictation errors.    Don Perking, [place], MD 10/01/16 [phone]

## 2016-10-01 NOTE — ED Notes (Signed)
Patient transported to X-ray 

## 2016-12-29 DIAGNOSIS — H5203 Hypermetropia, bilateral: Secondary | ICD-10-CM | POA: Diagnosis not present

## 2017-01-28 ENCOUNTER — Ambulatory Visit (INDEPENDENT_AMBULATORY_CARE_PROVIDER_SITE_OTHER): Payer: Self-pay | Admitting: Nurse Practitioner

## 2017-01-28 ENCOUNTER — Ambulatory Visit: Payer: Self-pay

## 2017-01-28 VITALS — BP 100/80 | HR 62 | Temp 98.4°F | Wt 203.0 lb

## 2017-01-28 DIAGNOSIS — J0140 Acute pansinusitis, unspecified: Secondary | ICD-10-CM

## 2017-01-28 MED ORDER — AMOXICILLIN-POT CLAVULANATE 875-125 MG PO TABS: 1.0000 | ORAL_TABLET | Freq: Two times a day (BID) | ORAL | 0 refills | Status: AC

## 2017-01-28 NOTE — Progress Notes (Signed)
Subjective:  Deanna Gross is a 32 y.o. female who presents for evaluation of possible sinusitis.  Symptoms include facial pain, nasal congestion, no fever, sinus pressure, sinus pain and tooth pain.  Onset of symptoms was 5 days ago, and has been gradually worsening since that time.  Treatment to date:  decongestants, ibuprofen and pseudophet.  High risk factors for influenza complications:  patient has a history of sinusitis, mostly preempted by polyps, deviated septum. .  The following portions of the patient's history were reviewed and updated as appropriate:  allergies, current medications and past medical history.  Constitutional: negative Eyes: negative Ears, nose, mouth, throat, and face: positive for nasal congestion and pain that radiates to back of right ear, negative for ear drainage, hoarseness and sore throat Respiratory: negative Cardiovascular: negative Neurological: positive for headaches Behavioral/Psych: negative Allergic/Immunologic: positive for hay fever Objective:  BP 100/80   Pulse 62   Temp 98.4 F (36.9 C)   Wt 203 lb (92.1 kg)   SpO2 97%   BMI 37.13 kg/m  General appearance: alert and cooperative Head: Normocephalic, without obvious abnormality, atraumatic Eyes: conjunctivae/corneas clear. PERRL, EOM's intact. Fundi benign. Ears: normal TM's and external ear canals both ears Nose: no discharge, turbinates swollen, edematous, moderate maxillary sinus tenderness bilateral, moderate frontal sinus tenderness bilateral, deviated septum, nasal polyps Throat: lips, mucosa, and tongue normal; teeth and gums normal Lungs: clear to auscultation bilaterally Heart: regular rate and rhythm, S1, S2 normal, no murmur, click, rub or gallop Pulses: 2+ and symmetric Skin: Skin color, texture, turgor normal. No rashes or lesions Lymph nodes: Cervical, supraclavicular, and axillary nodes normal. and cerviical and submandibular nodes present Neurologic: Grossly normal     Assessment:  sinusitis    Plan:  Discussed diagnosis and treatment of sinusitis. Discussed the importance of avoiding unnecessary antibiotic therapy. Suggested symptomatic OTC remedies. Supportive care with appropriate antipyretics and fluids. Nasal saline spray for congestion. Augmentin per orders. Nasal steroids per orders. Follow up as needed. Patient will continue to use Flonase.  Follow up with ENT for sinus surgery.

## 2017-01-30 ENCOUNTER — Telehealth: Payer: Self-pay

## 2017-01-30 NOTE — Telephone Encounter (Signed)
Called to follow up with patient regarding her visit this past Tuesday and she states she is doing fine.

## 2017-02-26 IMAGING — CT CT PARANASAL SINUSES LIMITED
1 series · 13 of 15 positions shown, 17 images · non-contrast
Comparison: None.

CLINICAL DATA: Chronic sinusitis, progressing

EXAM:
CT PARANASAL SINUS LIMITED WITHOUT CONTRAST
TECHNIQUE: Non-contiguous multidetector CT images of the paranasal sinuses were
obtained in coronal plane without contrast.

[Series 3: limited sinus st · axial · 0.27mm/px · z∈[-115,+5]mm · 13 of 15 slices shown, 17 images]
[im 2/15  brain]
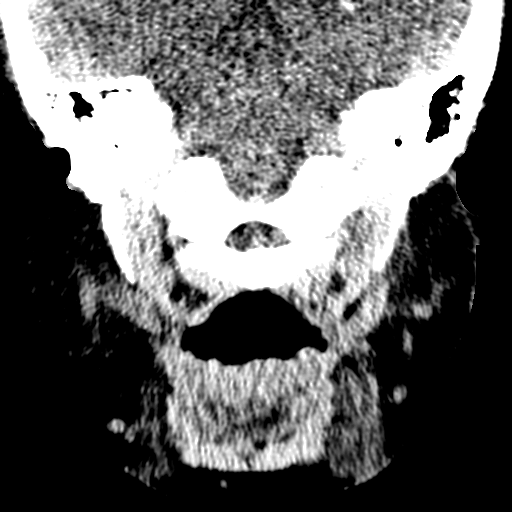
[im 2/15  bone]
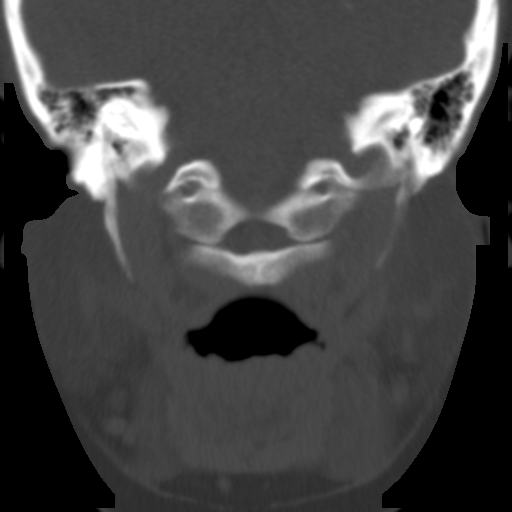
[im 3/15  bone]
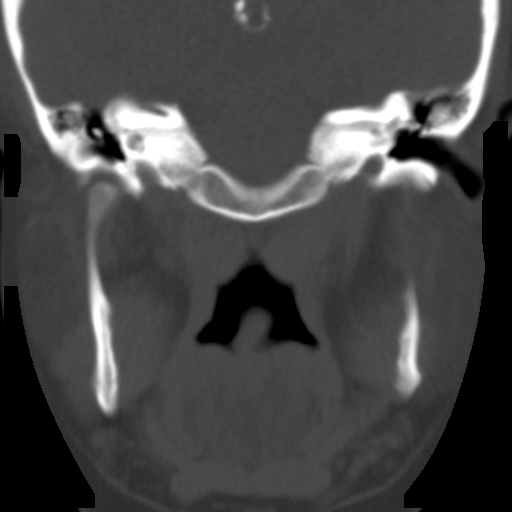
[im 4/15  bone]
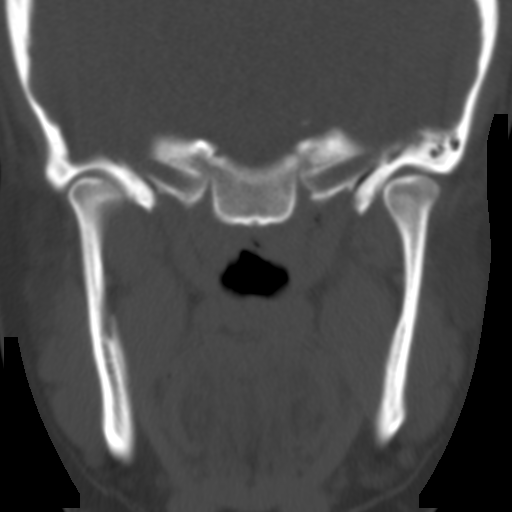
[im 5/15  bone]
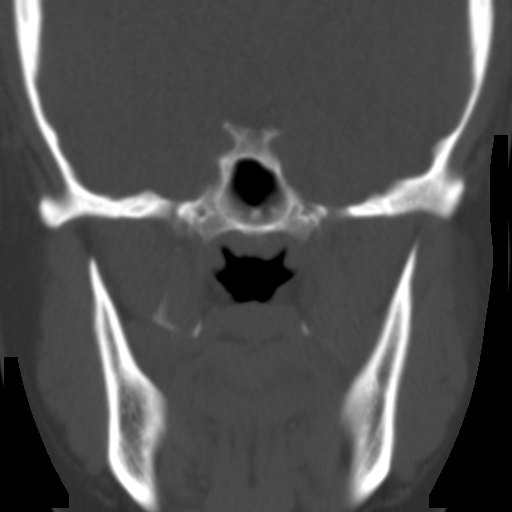
[im 6/15  brain]
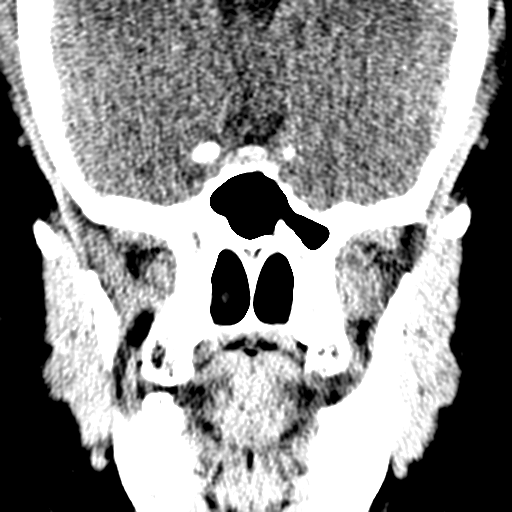
[im 6/15  bone]
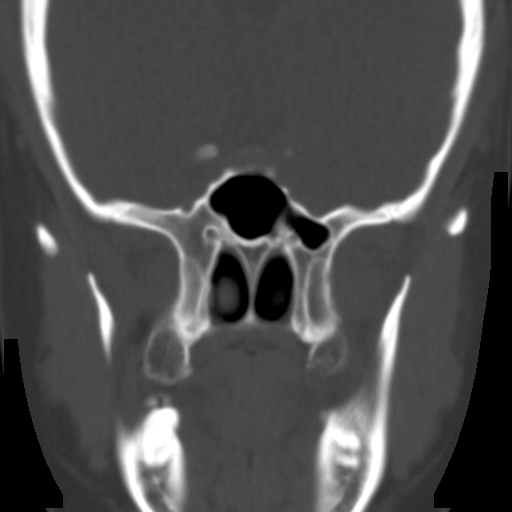
[im 7/15  bone]
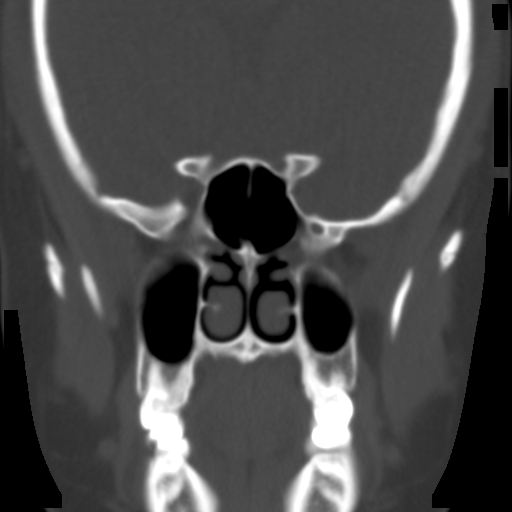
[im 8/15  bone]
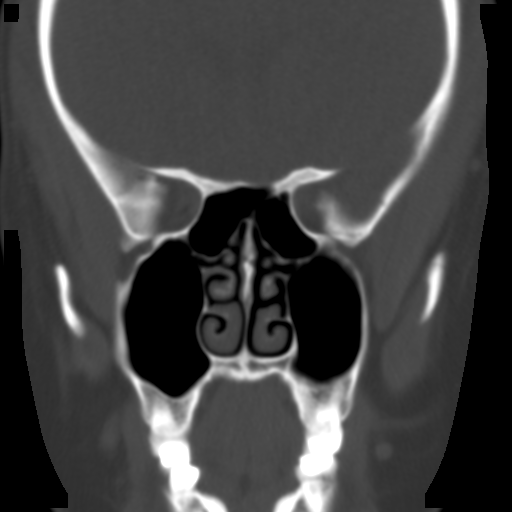
[im 9/15  bone]
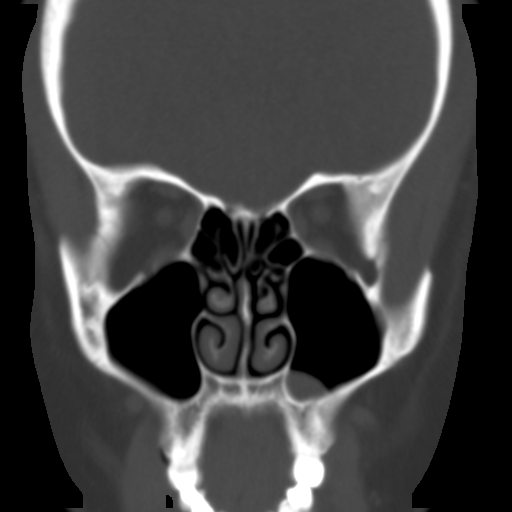
[im 10/15  brain]
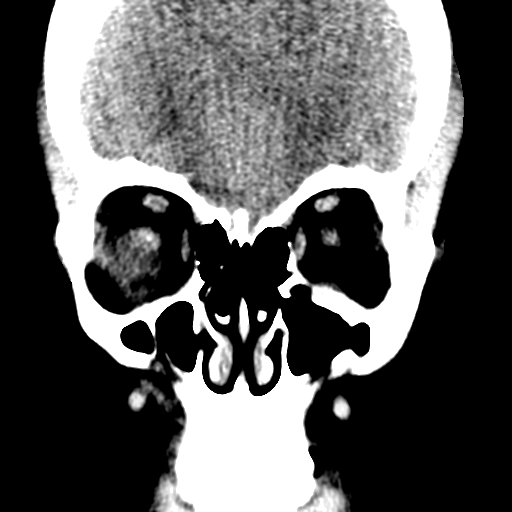
[im 10/15  bone]
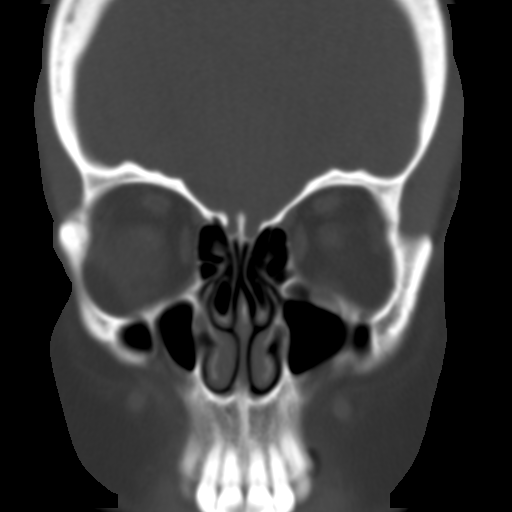
[im 11/15  bone]
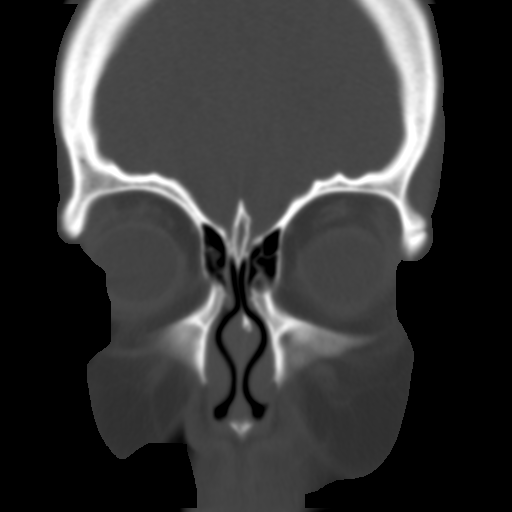
[im 12/15  bone]
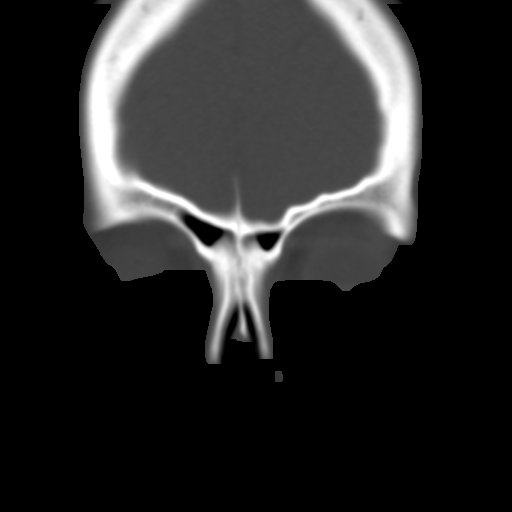
[im 13/15  bone]
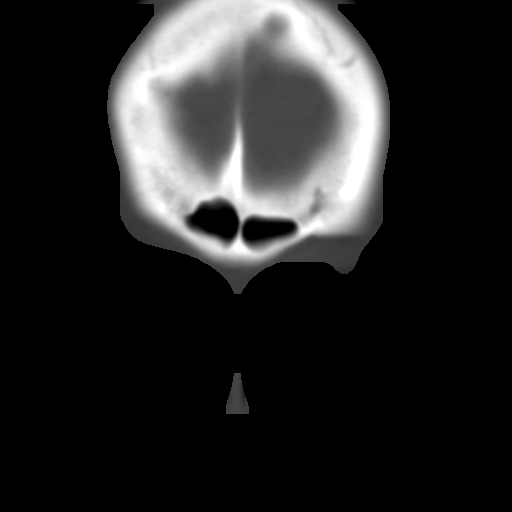
[im 14/15  brain]
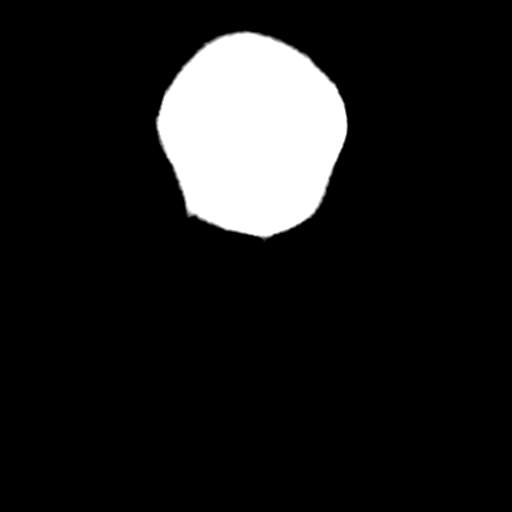
[im 14/15  bone]
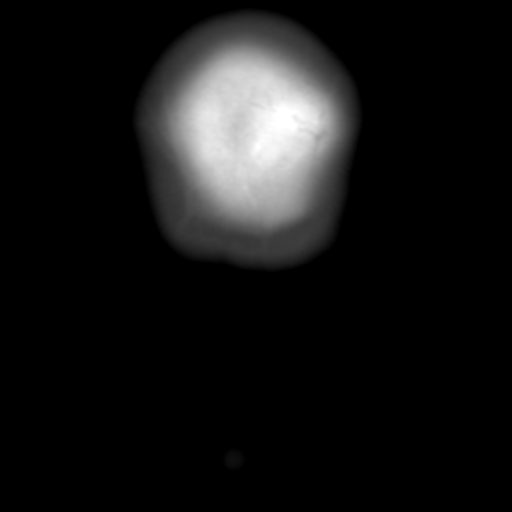

[13 of 15 positions shown; findings below may reference images not displayed]

FINDINGS: There is a 1.1 x 0.8 cm retention cyst in the inferior right
maxillary antrum. Paranasal sinuses elsewhere are clear. There is no
air-fluid level. No bony destruction or expansion. Images of the
ostiomeatal unit complexes are less than optimal on this limited
study ; there is no mucosal thickening in this area to suggest
ostiomeatal unit obstruction. There is a concha bullosa on each
side, an anatomic variant. There is minimal rightward deviation of
the nasal septum. There is no nares obstruction.

No intraorbital lesions are apparent. Visualized brain parenchyma
appears unremarkable on this limited study. No fracture or
dislocation evident. Visualized mastoids are clear.
IMPRESSION: Retention cyst in the inferior right maxillary antrum. Other
paranasal sinuses clear. No air-fluid levels. No bony destruction or
expansion. Minimal rightward deviation of the nasal septum is noted.
There is no nares obstruction.

## 2017-03-10 ENCOUNTER — Encounter: Payer: Self-pay | Admitting: Obstetrics and Gynecology

## 2017-03-10 ENCOUNTER — Ambulatory Visit (INDEPENDENT_AMBULATORY_CARE_PROVIDER_SITE_OTHER): Payer: No Typology Code available for payment source | Admitting: Obstetrics and Gynecology

## 2017-03-10 VITALS — BP 126/77 | HR 69 | Ht 63.0 in | Wt 199.0 lb

## 2017-03-10 DIAGNOSIS — Z3169 Encounter for other general counseling and advice on procreation: Secondary | ICD-10-CM

## 2017-03-10 NOTE — Progress Notes (Signed)
GYNECOLOGY OFFICE FOLLOW UP NOTE  History:  32 y.o. G0P0000 here today for follow up for infertility. Has been trying to conceive for approx 1 year with no success. No h/o pregnancy and FOB has fathered no children.   Has been on Cando in past, has not been on oral contraception in last 5 years. Has used natural family planning last few years. Have been actively trying to get pregnant the last year using timed intercourse based on period tracker on phone. Has regular monthly periods, every 28 days, bleeds 4-5 days, heavy bleeding in days 2-4. Has had regular periods the last 6 years, before that, she was having very irregular periods while in a bad relationship where she would skip 6 months, then have 4 regular, etc. The irregular periods stopped shortly after she left that boyfriend and has had regular periods since.   Patient has been trying to lose weight, has been eating low carb and has lot about 20 pounds since last summer. Husband is healthy but has a history of smoking pot.   Past Medical History:  Diagnosis Date  . Asthma    exercise-induced; prn inhaler  . Headache(784.0)    sinus  . History of MRSA infection 2008   left leg  . Insect bites 08/30/2013  . Seasonal allergies   . Surgical wound, non healing 08/2013   left breast    Past Surgical History:  Procedure Laterality Date  . BREAST CYST EXCISION Left 03/18/2013   Procedure: EXCISE CYST LEFT MEDIAL BREAST;  Surgeon: Earnstine Regal, MD;  Location: [facility];  Service: General;  Laterality: Left;  Marland Kitchen MASS EXCISION Left 09/01/2013   Procedure: RE-EXCISION NON HEALING WOUND LEFT BREAST;  Surgeon: Earnstine Regal, MD;  Location: [facility];  Service: General;  Laterality: Left;  . TONSILLECTOMY    . TONSILLECTOMY AND ADENOIDECTOMY    . TYMPANOSTOMY TUBE PLACEMENT     x 3  . WISDOM TOOTH EXTRACTION       Current Outpatient Medications:  .  fexofenadine (ALLEGRA) 180 MG tablet, Take 180 mg by  mouth daily., Disp: , Rfl:  .  Prenatal Vit-Fe Fumarate-FA (PRENATAL 1+1 PO), Take by mouth., Disp: , Rfl:  .  sertraline (ZOLOFT) 50 MG tablet, Take 1 tablet (50 mg total) by mouth at bedtime., Disp: 90 tablet, Rfl: 3 .  albuterol (PROVENTIL HFA;VENTOLIN HFA) 108 (90 BASE) MCG/ACT inhaler, Inhale into the lungs every 6 (six) hours as needed for wheezing or shortness of breath., Disp: , Rfl:  .  fluticasone (FLONASE) 50 MCG/ACT nasal spray, Place into both nostrils daily., Disp: , Rfl:  .  Prenatal MV-Min-Fe Fum-FA-DHA (PRENATAL 1 PO), Take by mouth., Disp: , Rfl:   The following portions of the patient's history were reviewed and updated as appropriate: allergies, current medications, past family history, past medical history, past social history, past surgical history and problem list.   Review of Systems:  Pertinent items noted in HPI and remainder of comprehensive ROS otherwise negative.   Objective:  Physical Exam BP 126/77   Pulse 69   Ht _0  (1.6 m)   Wt 199 lb (90.3 kg)   LMP 02/24/2017   BMI 35.25 kg/m  CONSTITUTIONAL: Well-developed, well-nourished female in no acute distress.  HENT:  Normocephalic, atraumatic. External right and left ear normal. Oropharynx is clear and moist EYES: Conjunctivae and EOM are normal. Pupils are equal, round, and reactive to light. No scleral icterus.  NECK: Normal range  of motion, supple, no masses SKIN: Skin is warm and dry. No rash noted. Not diaphoretic. No erythema. No pallor. NEUROLOGIC: Alert and oriented to person, place, and time. Normal reflexes, muscle tone coordination. No cranial nerve deficit noted. PSYCHIATRIC: Normal mood and affect. Normal behavior. Normal judgment and thought content. CARDIOVASCULAR: Normal heart rate noted RESPIRATORY: Effort and breath sounds normal, no problems with respiration noted ABDOMEN: Soft, no distention noted.   PELVIC: Deferred MUSCULOSKELETAL: Normal range of motion. No edema noted.  Labs and  Imaging No results found.  Assessment & Plan:  1. Infertility counseling No obvious cause, patient will start using ovulation predictor kit Will return for day 3 FSH/LH Will base next steps on results Reviewed importance of managing stress - Anti mullerian hormone - TSH - CBC - Comp Met (CMET) - Prolactin - US PELVIC COMPLETE WITH TRANSVAGINAL; Future   Routine preventative health maintenance measures emphasized. Please refer to After Visit Summary for other counseling recommendations.   Return if symptoms worsen or fail to improve.  Total face-to-face time with patient: 30 minutes. Over 50% of encounter was spent on counseling and coordination of care.   Feliz Beam, M.D. Attending [place], [facility] for [employer], [place]

## 2017-03-10 NOTE — Progress Notes (Signed)
Pt has been trying to get pregnant since Feb 2018.

## 2017-03-10 NOTE — Addendum Note (Signed)
Addended by: Kathie DikeSOLA, DEANNA J on: 03/10/2017 04:57 PM   Modules accepted: Orders

## 2017-03-12 LAB — COMPREHENSIVE METABOLIC PANEL
ALK PHOS: 60 IU/L (ref 39–117)
ALT: 17 IU/L (ref 0–32)
AST: 18 IU/L (ref 0–40)
Albumin/Globulin Ratio: 2.1 (ref 1.2–2.2)
Albumin: 4.4 g/dL (ref 3.5–5.5)
BUN/Creatinine Ratio: 25 — ABNORMAL HIGH (ref 9–23)
BUN: 18 mg/dL (ref 6–20)
Bilirubin Total: 0.2 mg/dL (ref 0.0–1.2)
CO2: 23 mmol/L (ref 20–29)
CREATININE: 0.73 mg/dL (ref 0.57–1.00)
Calcium: 9.6 mg/dL (ref 8.7–10.2)
Chloride: 101 mmol/L (ref 96–106)
GFR calc Af Amer: 126 mL/min/{1.73_m2} (ref >59)
GFR calc non Af Amer: 109 mL/min/{1.73_m2} (ref >59)
Globulin, Total: 2.1 g/dL (ref 1.5–4.5)
Glucose: 92 mg/dL (ref 65–99)
Potassium: 4.6 mmol/L (ref 3.5–5.2)
Sodium: 140 mmol/L (ref 134–144)
Total Protein: 6.5 g/dL (ref 6.0–8.5)

## 2017-03-12 LAB — CBC
HEMOGLOBIN: 13.4 g/dL (ref 11.1–15.9)
Hematocrit: 40.4 % (ref 34.0–46.6)
MCH: 29.7 pg (ref 26.6–33.0)
MCHC: 33.2 g/dL (ref 31.5–35.7)
MCV: 90 fL (ref 79–97)
Platelets: 269 10*3/uL (ref 150–379)
RBC: 4.51 x10E6/uL (ref 3.77–5.28)
RDW: 13.7 % (ref 12.3–15.4)
WBC: 7.1 10*3/uL (ref 3.4–10.8)

## 2017-03-12 LAB — TSH: TSH: 1.3 u[IU]/mL (ref 0.450–4.500)

## 2017-03-12 LAB — PROLACTIN: PROLACTIN: 32 ng/mL — AB (ref 4.8–23.3)

## 2017-03-13 ENCOUNTER — Ambulatory Visit (INDEPENDENT_AMBULATORY_CARE_PROVIDER_SITE_OTHER): Payer: No Typology Code available for payment source

## 2017-03-13 DIAGNOSIS — N854 Malposition of uterus: Secondary | ICD-10-CM | POA: Diagnosis not present

## 2017-03-13 DIAGNOSIS — Z3169 Encounter for other general counseling and advice on procreation: Secondary | ICD-10-CM

## 2017-03-14 LAB — ANTI MULLERIAN HORMONE: ANTI-MULLERIAN HORMONE (AMH): 1.85 ng/mL

## 2017-03-30 ENCOUNTER — Telehealth: Payer: Self-pay | Admitting: Obstetrics and Gynecology

## 2017-03-30 NOTE — Telephone Encounter (Signed)
Left message for patient regarding results. Seen for infertility, has not done day 3 labs yet but no significant abnormalities. At this point, would refer to Dr. April MansonYalcinkaya for further infertility counseling/workup.    Baldemar LenisK. Meryl Ajay Strubel, M.D. Attending Obstetrician & Gynecologist, [facility] for [facility], [facility]

## 2017-03-31 ENCOUNTER — Telehealth: Payer: Self-pay | Admitting: *Deleted

## 2017-03-31 ENCOUNTER — Encounter: Payer: Self-pay | Admitting: *Deleted

## 2017-03-31 NOTE — Telephone Encounter (Signed)
-----   Message from Conan BowensKelly M Davis, MD sent at 03/30/2017  2:28 PM EST ----- Please call patient and let her know no significant abnormalities in workup thus far, would refer to Dr. April MansonYalcinkaya for further infertility workup. Thank you!

## 2017-03-31 NOTE — Telephone Encounter (Signed)
Pt notified of normal infertility workup so far and per Dr Earlene Plateravis she needs a referral to Dr Collier BullockYalcinkay for further workup.  Referral sent

## 2017-04-17 ENCOUNTER — Other Ambulatory Visit
Admission: RE | Admit: 2017-04-17 | Discharge: 2017-04-17 | Disposition: A | Discharge status: Home / Self Care | Payer: No Typology Code available for payment source | Source: Ambulatory Visit | Attending: Internal Medicine | Admitting: Internal Medicine

## 2017-04-17 DIAGNOSIS — E288 Other ovarian dysfunction: Secondary | ICD-10-CM | POA: Insufficient documentation

## 2017-04-18 LAB — PROLACTIN: PROLACTIN: 6.6 ng/mL (ref 4.8–23.3)

## 2017-04-18 LAB — CA 125: CANCER ANTIGEN (CA) 125: 12.4 U/mL (ref 0.0–38.1)

## 2017-04-18 LAB — TESTOSTERONE: TESTOSTERONE: 25 ng/dL (ref 8–48)

## 2017-05-08 ENCOUNTER — Encounter: Payer: Self-pay | Admitting: Family Medicine

## 2017-05-08 ENCOUNTER — Ambulatory Visit (INDEPENDENT_AMBULATORY_CARE_PROVIDER_SITE_OTHER): Payer: No Typology Code available for payment source | Admitting: Family Medicine

## 2017-05-08 VITALS — BP 87/59 | HR 73 | Ht 63.0 in | Wt 200.0 lb

## 2017-05-08 DIAGNOSIS — M654 Radial styloid tenosynovitis [de Quervain]: Secondary | ICD-10-CM

## 2017-05-08 HISTORY — DX: Radial styloid tenosynovitis (de quervain): M65.4

## 2017-05-08 MED ORDER — DICLOFENAC SODIUM 1 % TD GEL: 2.0000 g | Freq: Four times a day (QID) | TRANSDERMAL | 11 refills | Status: DC

## 2017-05-08 NOTE — Patient Instructions (Signed)
Thank you for coming in today. Use the brace for 1-2 weeks.  Use the gel as needed up to 4x daily.  If not better let me know. We may consider hand therapy.   De Quervain Tenosynovitis Tendons attach muscles to bones. They also help with joint movements. When tendons become irritated or swollen, it is called tendinitis. The extensor pollicis brevis (EPB) tendon connects the EPB muscle to a bone that is near the base of the thumb. The EPB muscle helps to straighten and extend the thumb. De Quervain tenosynovitis is a condition in which the EPB tendon lining (sheath) becomes irritated, thickened, and swollen. This condition is sometimes called stenosing tenosynovitis. This condition causes pain on the thumb side of the back of the wrist. What are the causes? Causes of this condition include:  Activities that repeatedly cause your thumb and wrist to extend.  A sudden increase in activity or change in activity that affects your wrist.  What increases the risk? This condition is more likely to develop in:  Females.  People who have diabetes.  Women who have recently given birth.  People who are over 33 years of age.  People who do activities that involve repeated hand and wrist motions, such as tennis, racquetball, volleyball, gardening, and taking care of children.  People who do heavy labor.  People who have poor wrist strength and flexibility.  People who do not warm up properly before activities.  What are the signs or symptoms? Symptoms of this condition include:  Pain or tenderness over the thumb side of the back of the wrist when your thumb and wrist are not moving.  Pain that gets worse when you straighten your thumb or extend your thumb or wrist.  Pain when the injured area is touched.  Locking or catching of the thumb joint while you bend and straighten your thumb.  Decreased thumb motion due to pain.  Swelling over the affected area.  How is this  diagnosed? This condition is diagnosed with a medical history and physical exam. Your health care provider will ask for details about your injury and ask about your symptoms. How is this treated? Treatment may include the use of icing and medicines to reduce pain and swelling. You may also be advised to wear a splint or brace to limit your thumb and wrist motion. In less severe cases, treatment may also include working with a physical therapist to strengthen your wrist and calm the irritation around your EPB tendon sheath. In severe cases, surgery may be needed. Follow these instructions at home: If you have a splint or brace:  Wear it as told by your health care provider. Remove it only as told by your health care provider.  Loosen the splint or brace if your fingers become numb and tingle, or if they turn cold and blue.  Keep the splint or brace clean and dry. Managing pain, stiffness, and swelling  If directed, apply ice to the injured area. ? Put ice in a plastic bag. ? Place a towel between your skin and the bag. ? Leave the ice on for 20 minutes, 2-3 times per day.  Move your fingers often to avoid stiffness and to lessen swelling.  Raise (elevate) the injured area above the level of your heart while you are sitting or lying down. General instructions  Return to your normal activities as told by your health care provider. Ask your health care provider what activities are safe for you.  Take over-the-counter  and prescription medicines only as told by your health care provider.  Keep all follow-up visits as told by your health care provider. This is important.  Do not drive or operate heavy machinery while taking prescription pain medicine. Contact a health care provider if:  Your pain, tenderness, or swelling gets worse, even if you have had treatment.  You have numbness or tingling in your wrist, hand, or fingers on the injured side. This information is not intended to  replace advice given to you by your health care provider. Make sure you discuss any questions you have with your health care provider. Document Released: 01/13/2005 Document Revised: 06/21/2015 Document Reviewed: 03/21/2014 Elsevier Interactive Patient Education  [employer].

## 2017-05-08 NOTE — Progress Notes (Signed)
Deanna Gross is a 32 y.o. female who [facility] for right wrist pain.  Deanna Gross notes a 2-week history of pain in the right radial wrist.  She is right-hand dominant.  The pain is worse with wrist and thumb motion.  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  She cannot recall any specific increase activity.  She notes that she took a 5-day course of an aromatase inhibitor for infertility and the symptoms started with the new medication.  She no longer is taking this medication.  She is tried some over-the-counter medications for pain which have helped a bit.  No fevers or chills.  Denies any injury.   Past Medical History:  Diagnosis Date  . Asthma    exercise-induced; prn inhaler  . Headache(784.0)    sinus  . History of MRSA infection 2008   left leg  . Insect bites 08/30/2013  . Seasonal allergies   . Surgical wound, non healing 08/2013   left breast   Past Surgical History:  Procedure Laterality Date  . BREAST CYST EXCISION Left 03/18/2013   Procedure: EXCISE CYST LEFT MEDIAL BREAST;  Surgeon: Velora Hecklerodd M Gerkin, MD;  Location: [facility];  Service: General;  Laterality: Left;  Marland Kitchen. MASS EXCISION Left 09/01/2013   Procedure: RE-EXCISION NON HEALING WOUND LEFT BREAST;  Surgeon: Velora Hecklerodd M Gerkin, MD;  Location: [facility];  Service: General;  Laterality: Left;  . TONSILLECTOMY    . TONSILLECTOMY AND ADENOIDECTOMY    . TYMPANOSTOMY TUBE PLACEMENT     x 3  . WISDOM TOOTH EXTRACTION     Social History   Tobacco Use  . Smoking status: Never Smoker  . Smokeless tobacco: Never Used  Substance Use Topics  . Alcohol use: Yes    Comment: rarely     ROS:  As above   Medications: Current Outpatient Medications  Medication Sig Dispense Refill  . albuterol (PROVENTIL HFA;VENTOLIN HFA) 108 (90 BASE) MCG/ACT inhaler Inhale into the lungs every 6 (six) hours as needed  for wheezing or shortness of breath.    . fexofenadine (ALLEGRA) 180 MG tablet Take 180 mg by mouth daily.    . fluticasone (FLONASE) 50 MCG/ACT nasal spray Place into both nostrils daily.    . Prenatal MV-Min-Fe Fum-FA-DHA (PRENATAL 1 PO) Take by mouth.    . Prenatal Vit-Fe Fumarate-FA (PRENATAL 1+1 PO) Take by mouth.    . sertraline (ZOLOFT) 50 MG tablet Take 1 tablet (50 mg total) by mouth at bedtime. 90 tablet 3  . diclofenac sodium (VOLTAREN) 1 % GEL Apply 2 g topically 4 (four) times daily. To affected joint. 100 g 11   No current facility-administered medications for this visit.    Allergies  Allergen Reactions  . Polytrim [Polymyxin B-Trimethoprim] Other (See Comments)    OPHTHALMIC - FACIAL/EYE SWELLING     Exam:  BP (!) 87/59   Pulse 73   Ht 5\' 3"  (1.6 m)   Wt 200 lb (90.7 kg)   BMI 35.43 kg/m  General: Well Developed, well nourished, and in no acute distress.  Neuro/Psych: Alert and oriented x3, extra-ocular muscles intact, able to move all 4 extremities, sensation grossly intact. Skin: Warm and dry, no rashes noted.  Respiratory: Not using accessory muscles, speaking in full sentences, trachea midline.  Cardiovascular: Pulses palpable, no extremity edema. Abdomen: Does not appear distended. MSK:  Right wrist normal-appearing no swelling  or ecchymosis. Full range of motion but pain with ulnar deviation. Tender to palpation radial styloid.   Positive Finkelstein's test. Minimally positive Tinel's test at carpal tunnel.     Assessment and Plan: 32 y.o. female with  Right wrist de Quervain's tenosynovitis.  Plan for treatment with thumb spica brace, diclofenac gel, and home self guided physical therapy activities. If not better next step would be injection versus formal occupational hand therapy. I am not sure if the aromatase inhibitor for 5 days caused the tendinitis.  Those medications can cause myalgias and arthralgias but not typically tendinitis.  Regardless  she is not taking it any longer.  Plan for watchful waiting.    No orders of the defined types were placed in this encounter.  Meds ordered this encounter  Medications  . diclofenac sodium (VOLTAREN) 1 % GEL    Sig: Apply 2 g topically 4 (four) times daily. To affected joint.    Dispense:  100 g    Refill:  11    Discussed warning signs or symptoms. Please see discharge instructions. Patient expresses understanding.

## 2017-05-12 ENCOUNTER — Other Ambulatory Visit
Admission: RE | Admit: 2017-05-12 | Discharge: 2017-05-12 | Disposition: A | Discharge status: Home / Self Care | Payer: No Typology Code available for payment source | Source: Ambulatory Visit | Attending: Internal Medicine | Admitting: Internal Medicine

## 2017-05-12 DIAGNOSIS — E288 Other ovarian dysfunction: Secondary | ICD-10-CM | POA: Diagnosis present

## 2017-05-13 LAB — PROGESTERONE: Progesterone: 24.7 ng/mL

## 2017-07-27 ENCOUNTER — Other Ambulatory Visit (HOSPITAL_COMMUNITY)
Admission: RE | Admit: 2017-07-27 | Discharge: 2017-07-27 | Disposition: A | Discharge status: Home / Self Care | Payer: No Typology Code available for payment source | Source: Ambulatory Visit | Attending: Osteopathic Medicine | Admitting: Osteopathic Medicine

## 2017-07-27 ENCOUNTER — Encounter: Payer: Self-pay | Admitting: Osteopathic Medicine

## 2017-07-27 ENCOUNTER — Ambulatory Visit (INDEPENDENT_AMBULATORY_CARE_PROVIDER_SITE_OTHER): Payer: No Typology Code available for payment source | Admitting: Osteopathic Medicine

## 2017-07-27 VITALS — BP 130/67 | HR 67 | Temp 98.2°F | Wt 206.0 lb

## 2017-07-27 DIAGNOSIS — R87620 Atypical squamous cells of undetermined significance on cytologic smear of vagina (ASC-US): Secondary | ICD-10-CM | POA: Diagnosis not present

## 2017-07-27 DIAGNOSIS — R87810 Cervical high risk human papillomavirus (HPV) DNA test positive: Secondary | ICD-10-CM | POA: Diagnosis not present

## 2017-07-27 DIAGNOSIS — R87811 Vaginal high risk human papillomavirus (HPV) DNA test positive: Secondary | ICD-10-CM

## 2017-07-27 DIAGNOSIS — F411 Generalized anxiety disorder: Secondary | ICD-10-CM

## 2017-07-27 DIAGNOSIS — Z0000 Encounter for general adult medical examination without abnormal findings: Secondary | ICD-10-CM | POA: Diagnosis not present

## 2017-07-27 DIAGNOSIS — Z124 Encounter for screening for malignant neoplasm of cervix: Secondary | ICD-10-CM | POA: Insufficient documentation

## 2017-07-27 MED ORDER — SERTRALINE HCL 50 MG PO TABS: 50.0000 mg | ORAL_TABLET | Freq: Every day | ORAL | 3 refills | Status: DC

## 2017-07-27 NOTE — Patient Instructions (Addendum)
General Preventive Care  Most recent routine screening lipids/other labs: fasting when you get a chance   Tobacco: don't. Alcohol: moderation is ok for most people, caution w/ trying to get pregnant! Recreational/Illicit Drugs: don't! Exercise: as tolerated to reduce risk of cardiovascular disease and diabetes  Mental health: if need for mental health care (medicines, counseling, other), or concerns about moods, please let me know!   Sexual health: any concerns, let me know   Vaccines  Flu vaccine: recommended every fall (by Halloween!)  Tetanus booster: Tdap recommended every 10 years  Cancer screenings   Colon cancer screening: recommended at age 33, colonoscopy sooner if risk factors   Breast cancer screening: mammogram recommended at age 33  Cervical cancer screening: Pap today - repeat Pap if needed will be based on these results   Infection screenings . HIV: recommended screening at least once age 33-65, more often if risk factors  . Gonorrhea/Chlamydia: many insurances require testing for anyone on birth control pills, otherwise screening as needed . Hepatitis C: recommended for anyone born [id number]  Other . Aspirin: not needed  . Bone Density Test: recommended for women at age 33, men at age 33, sooner depending on risk factors . Advanced Directive: Living Will and/or Healthcare Power of Attorney recommended for everyone, regardless of age or health  . Cholesterol: recommended screening annually . Diabetes: recommended screening annually

## 2017-07-27 NOTE — Progress Notes (Signed)
HPI: Deanna Gross is a 32 y.o. female who  has a past medical history of Asthma, Headache(784.0), History of MRSA infection (2008), Insect bites (08/30/2013), Seasonal allergies, and Surgical wound, non healing (08/2013).  she presents to [facility] today, 07/27/17,  for chief complaint of: Annual Physical    Patient here for annual physical / wellness exam.  See preventive care reviewed as below.  Recent labs reviewed in detail with the patient.   Additional concerns today include:  None  Going through infertility work-up/treatment with her husband, they have been trying for about a year without success.  He reports they are considering fostering to adopt as well, she states she may need some forms filled out for that in the next few months    Past medical, surgical, social and family history reviewed:  Patient Active Problem List   Diagnosis Date Noted  . Tendinitis, de Quervain's 05/08/2017  . Uses condoms for contraception 05/23/2015  . HPV in female 05/22/2015  . Obesity (BMI 30-39.9) 05/03/2015  . IT band syndrome 05/03/2015  . Recurrent knee pain 05/03/2015  . Rhinitis, allergic 05/03/2015  . Chronic infection of sinus 05/03/2015    Past Surgical History:  Procedure Laterality Date  . BREAST CYST EXCISION Left 03/18/2013   Procedure: EXCISE CYST LEFT MEDIAL BREAST;  Surgeon: Velora Heckler, MD;  Location: [facility];  Service: General;  Laterality: Left;  Marland Kitchen MASS EXCISION Left 09/01/2013   Procedure: RE-EXCISION NON HEALING WOUND LEFT BREAST;  Surgeon: Velora Heckler, MD;  Location: [facility];  Service: General;  Laterality: Left;  . TONSILLECTOMY    . TONSILLECTOMY AND ADENOIDECTOMY    . TYMPANOSTOMY TUBE PLACEMENT     x 3  . WISDOM TOOTH EXTRACTION      Social History   Tobacco Use  . Smoking status: Never Smoker  . Smokeless tobacco: Never Used  Substance Use Topics  . Alcohol use: Yes     Comment: rarely    Family History  Problem Relation Age of Onset  . Hyperlipidemia Father   . Hypertension Father   . Depression Father   . Hypothyroidism Mother   . Thyroid disease Mother   . Cancer Maternal Grandfather        leukemia  . Cancer Paternal Grandfather        non-hodgkins lymphoma  . Stroke Maternal Grandmother      Current medication list and allergy/intolerance information reviewed:    Current Outpatient Medications  Medication Sig Dispense Refill  . albuterol (PROVENTIL HFA;VENTOLIN HFA) 108 (90 BASE) MCG/ACT inhaler Inhale into the lungs every 6 (six) hours as needed for wheezing or shortness of breath.    . diclofenac sodium (VOLTAREN) 1 % GEL Apply 2 g topically 4 (four) times daily. To affected joint. 100 g 11  . fexofenadine (ALLEGRA) 180 MG tablet Take 180 mg by mouth daily.    . fluticasone (FLONASE) 50 MCG/ACT nasal spray Place into both nostrils daily.    . Prenatal MV-Min-Fe Fum-FA-DHA (PRENATAL 1 PO) Take by mouth.    . Prenatal Vit-Fe Fumarate-FA (PRENATAL 1+1 PO) Take by mouth.    . sertraline (ZOLOFT) 50 MG tablet Take 1 tablet (50 mg total) by mouth at bedtime. 90 tablet 3   No current facility-administered medications for this visit.     Allergies  Allergen Reactions  . Polytrim [Polymyxin B-Trimethoprim] Other (See Comments)    OPHTHALMIC - FACIAL/EYE SWELLING  Review of Systems:  Constitutional:  No  fever, no chills, No recent illness, No unintentional weight changes. No significant fatigue.   HEENT: No  headache, no vision change, no hearing change, No sore throat, No  sinus pressure  Cardiac: No  chest pain, No  pressure, No palpitations, No  Orthopnea  Respiratory:  No  shortness of breath. No  Cough  Gastrointestinal: No  abdominal pain, No  nausea, No  vomiting,  No  blood in stool, No  diarrhea, No  constipation   Musculoskeletal: No new myalgia/arthralgia  Skin: No  Rash, No other wounds/concerning  lesions  Genitourinary: No  incontinence, No  abnormal genital bleeding, No abnormal genital discharge  Hem/Onc: No  easy bruising/bleeding, No  abnormal lymph node  Endocrine: No cold intolerance,  No heat intolerance. No polyuria/polydipsia/polyphagia   Neurologic: No  weakness, No  dizziness, No  slurred speech/focal weakness/facial droop  Psychiatric: No  concerns with depression, +concerns with anxiety, No sleep problems, No mood problems     Exam:  BP 130/67 (BP Location: Left Arm, Patient Position: Sitting, Cuff Size: Normal)   Pulse 67   Temp 98.2 F (36.8 C) (Oral)   Wt 206 lb (93.4 kg)   BMI 36.49 kg/m   Constitutional: VS see above. General Appearance: alert, well-developed, well-nourished, NAD  Eyes: Normal lids and conjunctive, non-icteric sclera  Ears, Nose, Mouth, Throat: MMM, Normal external inspection ears/nares/mouth/lips/gums. TM normal bilaterally. Pharynx/tonsils no erythema, no exudate. Nasal mucosa normal.   Neck: No masses, trachea midline. No thyroid enlargement. No tenderness/mass appreciated. No lymphadenopathy  Respiratory: Normal respiratory effort. no wheeze, no rhonchi, no rales  Cardiovascular: S1/S2 normal, no murmur, no rub/gallop auscultated. RRR. No lower extremity edema. Pedal pulse II/IV bilaterally DP and PT. No carotid bruit or JVD. No abdominal aortic bruit.  Gastrointestinal: Nontender, no masses. No hepatomegaly, no splenomegaly. No hernia appreciated. Bowel sounds normal. Rectal exam deferred.   Musculoskeletal: Gait normal. No clubbing/cyanosis of digits.   Neurological: Normal balance/coordination. No tremor. No cranial nerve deficit on limited exam. Motor and sensation intact and symmetric. Cerebellar reflexes intact.   Skin: warm, dry, intact. No rash/ulcer. No concerning nevi or subq nodules on limited exam.    Psychiatric: Normal judgment/insight. Normal mood and affect. Oriented x3.  GYN: No lesions/ulcers to external  genitalia, normal urethra, normal vaginal mucosa, physiologic discharge, cervix normal without lesions, uterus not enlarged or tender, adnexa no masses and nontender  BREAST: No rashes/skin changes, normal fibrous breast tissue, no masses or tenderness, normal nipple without discharge, normal axilla      ASSESSMENT/PLAN:   Annual physical exam - Plan: CBC, COMPLETE METABOLIC PANEL WITH GFR, Lipid panel, TSH, VITAMIN D 25 Hydroxy (Vit-D Deficiency, Fractures)  ASCUS with positive high risk human papillomavirus of vagina  Cervical cancer screening - Plan: Cytology - PAP  Generalized anxiety disorder - Plan: sertraline (ZOLOFT) 50 MG tablet   Immunization History  Administered Date(s) Administered  . Tdap 01/27/2009     Patient Instructions  General Preventive Care  Most recent routine screening lipids/other labs: fasting when you get a chance   Tobacco: don't. Alcohol: moderation is ok for most people, caution w/ trying to get pregnant! Recreational/Illicit Drugs: don't! Exercise: as tolerated to reduce risk of cardiovascular disease and diabetes  Mental health: if need for mental health care (medicines, counseling, other), or concerns about moods, please let me know!   Sexual health: any concerns, let me know   Vaccines  Flu vaccine: recommended every  fall (by Halloween!)  Tetanus booster: Tdap recommended every 10 years  Cancer screenings   Colon cancer screening: recommended at age 16, colonoscopy sooner if risk factors   Breast cancer screening: mammogram recommended at age 24  Cervical cancer screening: Pap today - repeat Pap if needed will be based on these results   Infection screenings . HIV: recommended screening at least once age 12-65, more often if risk factors  . Gonorrhea/Chlamydia: many insurances require testing for anyone on birth control pills, otherwise screening as needed . Hepatitis C: recommended for anyone born 74-1966  Other . Aspirin:  not needed  . Bone Density Test: recommended for women at age 16, men at age 36, sooner depending on risk factors . Advanced Directive: Living Will and/or Healthcare Power of Attorney recommended for everyone, regardless of age or health  . Cholesterol: recommended screening annually . Diabetes: recommended screening annually           Visit summary with medication list and pertinent instructions was printed for patient to review. All questions at time of visit were answered - patient instructed to contact office with any additional concerns. ER/RTC precautions were reviewed with the patient.   Follow-up plan: Return in about 1 year (around 07/28/2018) for annual physical, sooner if needed .   Please note: voice recognition software was used to produce this document, and typos may escape review. Please contact Dr. Lyn Hollingshead for any needed clarifications.

## 2017-07-28 ENCOUNTER — Encounter: Payer: Self-pay | Admitting: Osteopathic Medicine

## 2017-08-04 LAB — CYTOLOGY - PAP
Diagnosis: NEGATIVE
HPV (WINDOPATH): DETECTED — AB
HPV 16/18/45 GENOTYPING: NEGATIVE

## 2017-08-12 MED FILL — SERTRALINE HCL 50 MG TABLET: 50 | 90 days supply | Qty: 90 | Fill #0

## 2017-08-13 MED FILL — LETROZOLE 2.5 MG TABLET: 2.5 | 5 days supply | Qty: 15 | Fill #0

## 2017-09-11 MED FILL — LETROZOLE 2.5 MG TABLET: 2.5 | 5 days supply | Qty: 15 | Fill #1

## 2017-09-22 MED FILL — OVIDREL 250 MCG/0.5 ML SYRG: 250 | 14 days supply | Qty: 1 | Fill #0

## 2017-10-21 ENCOUNTER — Ambulatory Visit (INDEPENDENT_AMBULATORY_CARE_PROVIDER_SITE_OTHER): Payer: No Typology Code available for payment source | Admitting: Physician Assistant

## 2017-10-21 ENCOUNTER — Encounter: Payer: Self-pay | Admitting: Physician Assistant

## 2017-10-21 VITALS — BP 125/82 | HR 56 | Temp 98.1°F | Wt 201.3 lb

## 2017-10-21 DIAGNOSIS — L723 Sebaceous cyst: Secondary | ICD-10-CM | POA: Diagnosis not present

## 2017-10-21 MED FILL — metroNIDAZOLE 500 MG TABS: 500 | 10 days supply | Qty: 20 | Fill #0

## 2017-10-21 MED FILL — CIPROFLOXACIN HCL 500 MG TA: 500 | 10 days supply | Qty: 20 | Fill #0

## 2017-10-21 NOTE — Patient Instructions (Signed)
-   warm compresses every 3-4 hours  - Cipro should cover you for a skin infection. Duration of treatment is 7-14 days - if worsening in 48 hours, contact our office   Heat Therapy Heat therapy can help ease sore, stiff, injured, and tight muscles and joints. Heat relaxes your muscles, which may help ease your pain. What are the risks? If you have any of the following conditions, do not use heat therapy unless your health care provider has approved:  Poor circulation.  Healing wounds or scarred skin in the area being treated.  Diabetes, heart disease, or high blood pressure.  Not being able to feel (numbness) the area being treated.  Unusual swelling of the area being treated.  Active infections.  Blood clots.  Cancer.  Inability to communicate pain. This may include young children and people who have problems with their brain function (dementia).  Pregnancy.  Heat therapy should only be used on old, pre-existing, or long-lasting (chronic) injuries. Do not use heat therapy on new injuries unless directed by your health care provider. How to use heat therapy There are several different kinds of heat therapy, including:  Moist heat pack.  Warm water bath.  Hot water bottle.  Electric heating pad.  Heated gel pack.  Heated wrap.  Electric heating pad.  Use the heat therapy method suggested by your health care provider. Follow your health care provider's instructions on when and how to use heat therapy. General heat therapy recommendations  Do not sleep while using heat therapy. Only use heat therapy while you are awake.  Your skin may turn pink while using heat therapy. Do not use heat therapy if your skin turns red.  Do not use heat therapy if you have new pain.  High heat or long exposure to heat can cause burns. Be careful when using heat therapy to avoid burning your skin.  Do not use heat therapy on areas of your skin that are already irritated, such as with  a rash or sunburn. Contact a health care provider if:  You have blisters, redness, swelling, or numbness.  You have new pain.  Your pain is worse. This information is not intended to replace advice given to you by your health care provider. Make sure you discuss any questions you have with your health care provider. Document Released: 04/07/2011 Document Revised: 06/21/2015 Document Reviewed: 03/08/2013 Elsevier Interactive Patient Education  [employer].

## 2017-10-21 NOTE — Progress Notes (Signed)
HPI:                                                                Deanna Gross is a 32 y.o. female who presents to [facility] Deanna Gross: Primary Care Sports Medicine today for recurrent cyst  For the last 5 days she has had recurrent pain, swelling and redness in the skin of her left medial breast. No fever, chills, malaise, or drainage. Patient reports she has struggled with a recurrent cyst of her left breast for the last 6 years. She has had the area surgically excised twice and unfortunately it has recurred and occasionally flares up and drains spontaneously. Reports in the past I&D has not been successful and she has done well with Clindamycin.  She has been taking Doxycycline following an endometrial biopsy two days ago. Today she was started on Metronidazole and Ciprofloxacin in preparation for IVF procedure.        Past Medical History:  Diagnosis Date  . Asthma    exercise-induced; prn inhaler  . Headache(784.0)    sinus  . History of MRSA infection 2008   left leg  . Insect bites 08/30/2013  . Seasonal allergies   . Surgical wound, non healing 08/2013   left breast   Past Surgical History:  Procedure Laterality Date  . BREAST CYST EXCISION Left 03/18/2013   Procedure: EXCISE CYST LEFT MEDIAL BREAST;  Surgeon: Deanna Heckler, MD;  Location: [facility];  Service: General;  Laterality: Left;  Marland Kitchen MASS EXCISION Left 09/01/2013   Procedure: RE-EXCISION NON HEALING WOUND LEFT BREAST;  Surgeon: Deanna Heckler, MD;  Location: [facility];  Service: General;  Laterality: Left;  . TONSILLECTOMY    . TONSILLECTOMY AND ADENOIDECTOMY    . TYMPANOSTOMY TUBE PLACEMENT     x 3  . WISDOM TOOTH EXTRACTION     Social History   Tobacco Use  . Smoking status: Never Smoker  . Smokeless tobacco: Never Used  Substance Use Topics  . Alcohol use: Yes    Comment: rarely   family history includes Cancer in her maternal grandfather and paternal  grandfather; Depression in her father; Hyperlipidemia in her father; Hypertension in her father; Hypothyroidism in her mother; Stroke in her maternal grandmother; Thyroid disease in her mother.    ROS: negative except as noted in the HPI  Medications: Current Outpatient Medications  Medication Sig Dispense Refill  . albuterol (PROVENTIL HFA;VENTOLIN HFA) 108 (90 BASE) MCG/ACT inhaler Inhale into the lungs every 6 (six) hours as needed for wheezing or shortness of breath.    . ciprofloxacin (CIPRO) 500 MG tablet Take 500 mg by mouth 2 (two) times daily.    . diclofenac sodium (VOLTAREN) 1 % GEL Apply 2 g topically 4 (four) times daily. To affected joint. 100 g 11  . fexofenadine (ALLEGRA) 180 MG tablet Take 180 mg by mouth daily.    . fluticasone (FLONASE) 50 MCG/ACT nasal spray Place into both nostrils daily.    . metroNIDAZOLE (FLAGYL) 500 MG tablet Take 500 mg by mouth 3 (three) times daily.    . Prenatal MV-Min-Fe Fum-FA-DHA (PRENATAL 1 PO) Take by mouth.    . sertraline (ZOLOFT) 50 MG tablet Take 1 tablet (50 mg total) by  mouth at bedtime. 90 tablet 3  . letrozole (FEMARA) 2.5 MG tablet   3  . OVIDREL 250 MCG/0.5ML injection   0   No current facility-administered medications for this visit.    Allergies  Allergen Reactions  . Polytrim [Polymyxin B-Trimethoprim] Other (See Comments)    OPHTHALMIC - FACIAL/EYE SWELLING       Objective:  BP 125/82 (BP Location: Left Arm, Patient Position: Sitting, Cuff Size: Normal)   Pulse (!) 56   Temp 98.1 F (36.7 C) (Oral)   Wt 201 lb 4.8 oz (91.3 kg)   BMI 35.66 kg/m  Gen:  alert, not ill-appearing, no distress, appropriate for age, obese female HEENT: head normocephalic without obvious abnormality, conjunctiva and cornea clear, trachea midline Pulm: Normal work of breathing, normal phonation Neuro: alert and oriented x 3, no tremor MSK: extremities atraumatic, normal gait and station Skin: left upper inner breast there is a healed  longitudinal surgical incision measuring approx 1.5 cm, underlying this incision there is approx 2 cm area of redness and induration   No results found for this or any previous visit (from the past 72 hour(s)). No results found.    Assessment and Plan: 32 y.o. female with   .Deanna Gross was seen today for cyst.  Diagnoses and all orders for this visit:  Inflamed epidermoid cyst of skin  - I&D deferred today - she will have decent Staph and Strep coverage with Ciprofloxacin, which she is planning to start today for a genitourinary infection - warm compresses Q4H  - if no improvement in 48 hours, will proceed with I&D   Patient education and anticipatory guidance given Patient agrees with treatment plan Follow-up in 2 days or sooner as needed if symptoms worsen or fail to improve  Levonne Hubert PA-C

## 2017-10-22 MED FILL — GONAL-F 1,050 UNITS VIAL: 1050 | 14 days supply | Qty: 4 | Fill #0

## 2017-10-22 MED FILL — ESTRADIOL 2 MG TABLET: 2 | 30 days supply | Qty: 60 | Fill #0

## 2017-10-22 MED FILL — NOVAREL 5000 UNIT SOLR: 5000 | 1 days supply | Qty: 2 | Fill #0

## 2017-10-22 MED FILL — ESTRADIOL 0.1 MG PATCH: 0.1 | 28 days supply | Qty: 8 | Fill #0

## 2017-10-22 MED FILL — CETROTIDE 0.25 MG KIT: 0.25 | 5 days supply | Qty: 5 | Fill #0

## 2017-10-22 MED FILL — PROGESTERONE OIL 50 MG/ML V: 50 | 30 days supply | Qty: 30 | Fill #0

## 2017-10-22 MED FILL — MENOPUR 75 UNIT VIAL: 75 | 15 days supply | Qty: 15 | Fill #0

## 2017-10-30 MED FILL — BD NEEDLES 22GX1.5": 22G X 1-1/2 | 20 days supply | Qty: 30 | Fill #0

## 2017-10-30 MED FILL — BD NEEDLES 30GX0.5": 30G X 1/2" | 8 days supply | Qty: 25 | Fill #0

## 2017-10-30 MED FILL — BD 3 ML SYRINGE 18GX1-1/2: 18G X 1-1/2 | 30 days supply | Qty: 60 | Fill #0

## 2017-10-30 MED FILL — BD NEEDLES 30GX0.5: 30G X 1/2" | 8 days supply | Qty: 25 | Fill #0

## 2017-10-30 MED FILL — BD 3 ML SYRINGE 18GX1-1/2": 18G X 1-1/2 | 30 days supply | Qty: 60 | Fill #0

## 2017-10-30 MED FILL — DOXYCYCLINE HYCLATE 100 MG: 100 | 20 days supply | Qty: 40 | Fill #0

## 2017-10-30 MED FILL — BD NEEDLES 22GX1.5: 22G X 1-1/2 | 20 days supply | Qty: 30 | Fill #0

## 2017-11-03 MED FILL — SERTRALINE HCL 50 MG TABLET: 50 | 90 days supply | Qty: 90 | Fill #1

## 2017-11-19 MED FILL — OXYCODONE-ACETAMINOPHEN 5-3: 5-325 | 3 days supply | Qty: 10 | Fill #0

## 2017-11-19 MED FILL — PROMETHAZINE 12.5 MG TABLET: 12.5 | 3 days supply | Qty: 10 | Fill #0

## 2017-12-02 MED FILL — metroNIDAZOLE 500 MG TABS: 500 | 10 days supply | Qty: 20 | Fill #0

## 2017-12-02 MED FILL — CIPROFLOXACIN HCL 500 MG TA: 500 | 10 days supply | Qty: 20 | Fill #0

## 2017-12-02 MED FILL — FEMYNOR 0.25-35 MG-MCG TABS: 0.25-35 | 28 days supply | Qty: 28 | Fill #0

## 2017-12-29 MED FILL — DOTTI 0.1 MG/24HR PTTW: 0.1 | 28 days supply | Qty: 8 | Fill #1

## 2018-01-05 MED FILL — METHYLPREDNISOLONE 4 MG TAB: 4 | 4 days supply | Qty: 16 | Fill #0

## 2018-01-19 MED FILL — PROGESTERONE OIL 50 MG/ML V: 50 | 30 days supply | Qty: 30 | Fill #1

## 2018-01-19 MED FILL — BD NEEDLES 22GX1.5: 22G X 1-1/2 | 20 days supply | Qty: 30 | Fill #1

## 2018-01-19 MED FILL — ESTRADIOL 2 MG TABLET: 2 | 30 days supply | Qty: 60 | Fill #1

## 2018-01-19 MED FILL — DOTTI 0.1 MG/24HR PTTW: 0.1 | 28 days supply | Qty: 8 | Fill #2

## 2018-01-19 MED FILL — BD NEEDLES 22GX1.5": 22G X 1-1/2 | 20 days supply | Qty: 30 | Fill #1

## 2018-01-19 MED FILL — SERTRALINE HCL 50 MG TABLET: 50 | 90 days supply | Qty: 90 | Fill #2

## 2018-01-27 NOTE — L&D Delivery Note (Signed)
Delivery Note Pt pushed very well for about an hour and at 2:33 PM a healthy female was delivered via Vaginal, Spontaneous (Presentation: OA  ).  APGAR: 8, 9; weight pending.   Placenta status: delivered spontaneously .  Cord:  with the following complications: nuchal x one and corporal x 2 .   Anesthesia:  epidural Episiotomy: None Lacerations: 2nd degree Suture Repair: 3.0 vicryl rapide Est. Blood Loss (mL): 155mL  Mom to postpartum.  Baby to Couplet care / Skin to Skin.  Logan Bores 09/17/2018, 3:07 PM

## 2018-02-16 MED FILL — PROGESTERONE 200 MG CAPSULE: 200 | 14 days supply | Qty: 14 | Fill #0

## 2018-02-23 ENCOUNTER — Ambulatory Visit (INDEPENDENT_AMBULATORY_CARE_PROVIDER_SITE_OTHER): Payer: Self-pay | Admitting: Family Medicine

## 2018-02-23 ENCOUNTER — Encounter: Payer: Self-pay | Admitting: Family Medicine

## 2018-02-23 VITALS — BP 110/70 | HR 66 | Temp 98.2°F | Resp 16 | Wt 207.4 lb

## 2018-02-23 DIAGNOSIS — J019 Acute sinusitis, unspecified: Principal | ICD-10-CM

## 2018-02-23 DIAGNOSIS — B9789 Other viral agents as the cause of diseases classified elsewhere: Secondary | ICD-10-CM

## 2018-02-23 NOTE — Progress Notes (Signed)
Deanna Gross is a 33 y.o. female who presents today with 3 days of    Sinusitis like symptoms, she has attempted topical nasal treatments of astelin twice daily with only intermittent relief and this has not resolved symptoms. She has a long history of chronic sinusitis. Of note she has been under the care of infertility and is currently [redacted] weeks pregnant.  Review of Systems  Constitutional: Negative for chills, fever and malaise/fatigue.  HENT: Negative for congestion, ear discharge, ear pain, sinus pain and sore throat.   Eyes: Negative.   Respiratory: Negative for cough, sputum production and shortness of breath.   Cardiovascular: Negative.  Negative for chest pain.  Gastrointestinal: Negative for abdominal pain, diarrhea, nausea and vomiting.  Genitourinary: Negative for dysuria, frequency, hematuria and urgency.  Musculoskeletal: Negative for myalgias.  Skin: Negative.   Neurological: Negative for headaches.  Endo/Heme/Allergies: Negative.   Psychiatric/Behavioral: Negative.     Deanna Gross has a current medication list which includes the following prescription(s): albuterol, fexofenadine, prenatal mv-min-fe fum-fa-dha, progesterone, sertraline, and fluticasone. Also is allergic to polytrim [polymyxin b-trimethoprim].  Deanna Gross  has a past medical history of Asthma, Headache(784.0), History of MRSA infection (2008), Insect bites (08/30/2013), Seasonal allergies, and Surgical wound, non healing (08/2013). Also  has a past surgical history that includes Tonsillectomy and adenoidectomy; Wisdom tooth extraction; Tonsillectomy; Breast cyst excision (Left, 03/18/2013); Tympanostomy tube placement; and Mass excision (Left, 09/01/2013).    O: Vitals:   02/23/18 1722  BP: 110/70  Pulse: 66  Resp: 16  Temp: 98.2 F (36.8 C)  SpO2: 100%     Physical Exam Vitals signs reviewed.  Constitutional:      General: She is not in acute distress.    Appearance: Normal appearance. She is well-developed. She  is not ill-appearing, toxic-appearing or diaphoretic.  HENT:     Head: Normocephalic.     Right Ear: Hearing, tympanic membrane, ear canal and external ear normal.     Left Ear: Hearing, tympanic membrane, ear canal and external ear normal.     Nose: Congestion and rhinorrhea present.     Right Sinus: Maxillary sinus tenderness and frontal sinus tenderness present.     Left Sinus: Maxillary sinus tenderness and frontal sinus tenderness present.     Mouth/Throat:     Lips: Pink.     Mouth: Mucous membranes are moist.     Pharynx: Uvula midline. No oropharyngeal exudate or posterior oropharyngeal erythema.  Eyes:     Pupils: Pupils are equal, round, and reactive to light.  Neck:     Musculoskeletal: Normal range of motion and neck supple.  Cardiovascular:     Rate and Rhythm: Normal rate and regular rhythm.     Pulses: Normal pulses.     Heart sounds: Normal heart sounds.  Pulmonary:     Effort: Pulmonary effort is normal.     Breath sounds: Normal breath sounds.  Abdominal:     General: Bowel sounds are normal.     Palpations: Abdomen is soft.  Musculoskeletal: Normal range of motion.  Lymphadenopathy:     Head:     Right side of head: No submental or submandibular adenopathy.     Left side of head: No submental or submandibular adenopathy.     Cervical: No cervical adenopathy.  Neurological:     Mental Status: She is alert and oriented to person, place, and time.  Psychiatric:        Behavior: Behavior is cooperative.    A: 1.  Acute viral sinusitis    P: 1. Acute viral sinusitis Hx of chronic sinusitis historically under the care of ENT who recommended surgery but patient did not do the surgery. At this time she is requesting antibiotics- discussed the safety of treatment at this point during pregnancy and the embryo's safety. Patient stated she is requesting antibiotics and will go to another location. I am recommending coordination with OB/GYN for definitive treatment of  this known chronic condition.  Other orders - progesterone 50 MG/ML injection; Inject into the muscle daily.   Discussed with patient exam findings, suspected diagnosis etiology and  reviewed recommended treatment plan and follow up, including complications and indications for urgent medical follow up and evaluation. Medications including use and indications reviewed with patient. Patient provided relevant patient education on diagnosis and/or relevant related condition that were discussed and reviewed with patient at discharge. Patient verbalized understanding of information provided and agrees with plan of care (POC), all questions answered.

## 2018-02-23 NOTE — Patient Instructions (Signed)
Sinusitis, Adult  Sinusitis is inflammation of your sinuses. Sinuses are hollow spaces in the bones around your face. Your sinuses are located:   Around your eyes.   In the middle of your forehead.   Behind your nose.   In your cheekbones.  Mucus normally drains out of your sinuses. When your nasal tissues become inflamed or swollen, mucus can become trapped or blocked. This allows bacteria, viruses, and fungi to grow, which leads to infection. Most infections of the sinuses are caused by a virus.  Sinusitis can develop quickly. It can last for up to 4 weeks (acute) or for more than 12 weeks (chronic). Sinusitis often develops after a cold.  What are the causes?  This condition is caused by anything that creates swelling in the sinuses or stops mucus from draining. This includes:   Allergies.   Asthma.   Infection from bacteria or viruses.   Deformities or blockages in your nose or sinuses.   Abnormal growths in the nose (nasal polyps).   Pollutants, such as chemicals or irritants in the air.   Infection from fungi (rare).  What increases the risk?  You are more likely to develop this condition if you:   Have a weak body defense system (immune system).   Do a lot of swimming or diving.   Overuse nasal sprays.   Smoke.  What are the signs or symptoms?  The main symptoms of this condition are pain and a feeling of pressure around the affected sinuses. Other symptoms include:   Stuffy nose or congestion.   Thick drainage from your nose.   Swelling and warmth over the affected sinuses.   Headache.   Upper toothache.   A cough that may get worse at night.   Extra mucus that collects in the throat or the back of the nose (postnasal drip).   Decreased sense of smell and taste.   Fatigue.   A fever.   Sore throat.   Bad breath.  How is this diagnosed?  This condition is diagnosed based on:   Your symptoms.   Your medical history.   A physical exam.   Tests to find out if your condition is  acute or chronic. This may include:  ? Checking your nose for nasal polyps.  ? Viewing your sinuses using a device that has a light (endoscope).  ? Testing for allergies or bacteria.  ? Imaging tests, such as an MRI or CT scan.  In rare cases, a bone biopsy may be done to rule out more serious types of fungal sinus disease.  How is this treated?  Treatment for sinusitis depends on the cause and whether your condition is chronic or acute.   If caused by a virus, your symptoms should go away on their own within 10 days. You may be given medicines to relieve symptoms. They include:  ? Medicines that shrink swollen nasal passages (topical intranasal decongestants).  ? Medicines that treat allergies (antihistamines).  ? A spray that eases inflammation of the nostrils (topical intranasal corticosteroids).  ? Rinses that help get rid of thick mucus in your nose (nasal saline washes).   If caused by bacteria, your health care provider may recommend waiting to see if your symptoms improve. Most bacterial infections will get better without antibiotic medicine. You may be given antibiotics if you have:  ? A severe infection.  ? A weak immune system.   If caused by narrow nasal passages or nasal polyps, you may need   to have surgery.  Follow these instructions at home:  Medicines   Take, use, or apply over-the-counter and prescription medicines only as told by your health care provider. These may include nasal sprays.   If you were prescribed an antibiotic medicine, take it as told by your health care provider. Do not stop taking the antibiotic even if you start to feel better.  Hydrate and humidify     Drink enough fluid to keep your urine pale yellow. Staying hydrated will help to thin your mucus.   Use a cool mist humidifier to keep the humidity level in your home above 50%.   Inhale steam for 10-15 minutes, 3-4 times a day, or as told by your health care provider. You can do this in the bathroom while a hot shower is  running.   Limit your exposure to cool or dry air.  Rest   Rest as much as possible.   Sleep with your head raised (elevated).   Make sure you get enough sleep each night.  General instructions     Apply a warm, moist washcloth to your face 3-4 times a day or as told by your health care provider. This will help with discomfort.   Wash your hands often with soap and water to reduce your exposure to germs. If soap and water are not available, use hand sanitizer.   Do not smoke. Avoid being around people who are smoking (secondhand smoke).   Keep all follow-up visits as told by your health care provider. This is important.  Contact a health care provider if:   You have a fever.   Your symptoms get worse.   Your symptoms do not improve within 10 days.  Get help right away if:   You have a severe headache.   You have persistent vomiting.   You have severe pain or swelling around your face or eyes.   You have vision problems.   You develop confusion.   Your neck is stiff.   You have trouble breathing.  Summary   Sinusitis is soreness and inflammation of your sinuses. Sinuses are hollow spaces in the bones around your face.   This condition is caused by nasal tissues that become inflamed or swollen. The swelling traps or blocks the flow of mucus. This allows bacteria, viruses, and fungi to grow, which leads to infection.   If you were prescribed an antibiotic medicine, take it as told by your health care provider. Do not stop taking the antibiotic even if you start to feel better.   Keep all follow-up visits as told by your health care provider. This is important.  This information is not intended to replace advice given to you by your health care provider. Make sure you discuss any questions you have with your health care provider.  Document Released: 01/13/2005 Document Revised: 06/15/2017 Document Reviewed: 06/15/2017  Elsevier Interactive Patient Education  2019 [employer]

## 2018-03-05 LAB — OB RESULTS CONSOLE RPR: RPR: NONREACTIVE

## 2018-03-05 LAB — OB RESULTS CONSOLE ABO/RH: RH Type: POSITIVE

## 2018-03-05 LAB — OB RESULTS CONSOLE HEPATITIS B SURFACE ANTIGEN: Hepatitis B Surface Ag: NEGATIVE

## 2018-03-05 LAB — OB RESULTS CONSOLE HIV ANTIBODY (ROUTINE TESTING): HIV: NONREACTIVE

## 2018-03-05 LAB — OB RESULTS CONSOLE RUBELLA ANTIBODY, IGM: Rubella: IMMUNE

## 2018-03-05 LAB — OB RESULTS CONSOLE GC/CHLAMYDIA
Chlamydia: NEGATIVE
Gonorrhea: NEGATIVE

## 2018-05-06 MED FILL — SERTRALINE HCL 50 MG TABLET: 50 | 90 days supply | Qty: 90 | Fill #3

## 2018-07-06 MED FILL — AZITHROMYCIN 250 MG TABS: 250 | 5 days supply | Qty: 6 | Fill #0

## 2018-07-29 ENCOUNTER — Inpatient Hospital Stay (HOSPITAL_COMMUNITY): Payer: No Typology Code available for payment source

## 2018-07-29 ENCOUNTER — Encounter (HOSPITAL_COMMUNITY): Payer: Self-pay | Admitting: *Deleted

## 2018-07-29 ENCOUNTER — Other Ambulatory Visit: Payer: Self-pay

## 2018-07-29 ENCOUNTER — Inpatient Hospital Stay (HOSPITAL_COMMUNITY)
Admission: AD | Admit: 2018-07-29 | Discharge: 2018-07-30 | Disposition: A | Discharge status: Home / Self Care | Payer: No Typology Code available for payment source | Attending: Obstetrics and Gynecology | Admitting: Obstetrics and Gynecology

## 2018-07-29 DIAGNOSIS — Z3A31 31 weeks gestation of pregnancy: Secondary | ICD-10-CM | POA: Diagnosis not present

## 2018-07-29 DIAGNOSIS — Z3689 Encounter for other specified antenatal screening: Secondary | ICD-10-CM

## 2018-07-29 DIAGNOSIS — O26893 Other specified pregnancy related conditions, third trimester: Secondary | ICD-10-CM

## 2018-07-29 DIAGNOSIS — Z8614 Personal history of Methicillin resistant Staphylococcus aureus infection: Secondary | ICD-10-CM | POA: Insufficient documentation

## 2018-07-29 DIAGNOSIS — R1011 Right upper quadrant pain: Secondary | ICD-10-CM

## 2018-07-29 LAB — CBC WITH DIFFERENTIAL/PLATELET
Abs Immature Granulocytes: 0.07 10*3/uL (ref 0.00–0.07)
Basophils Absolute: 0 10*3/uL (ref 0.0–0.1)
Basophils Relative: 0 %
Eosinophils Absolute: 0.1 10*3/uL (ref 0.0–0.5)
Eosinophils Relative: 1 %
HCT: 34.7 % — ABNORMAL LOW (ref 36.0–46.0)
Hemoglobin: 12.4 g/dL (ref 12.0–15.0)
Immature Granulocytes: 1 %
Lymphocytes Relative: 23 %
Lymphs Abs: 3 10*3/uL (ref 0.7–4.0)
MCH: 30.9 pg (ref 26.0–34.0)
MCHC: 35.7 g/dL (ref 30.0–36.0)
MCV: 86.5 fL (ref 80.0–100.0)
Monocytes Absolute: 0.7 10*3/uL (ref 0.1–1.0)
Monocytes Relative: 6 %
Neutro Abs: 9 10*3/uL — ABNORMAL HIGH (ref 1.7–7.7)
Neutrophils Relative %: 69 %
Platelets: 229 10*3/uL (ref 150–400)
RBC: 4.01 MIL/uL (ref 3.87–5.11)
RDW: 12.6 % (ref 11.5–15.5)
WBC: 12.9 10*3/uL — ABNORMAL HIGH (ref 4.0–10.5)
nRBC: 0 % (ref 0.0–0.2)

## 2018-07-29 LAB — COMPREHENSIVE METABOLIC PANEL
ALT: 18 U/L (ref 0–44)
AST: 19 U/L (ref 15–41)
Albumin: 2.9 g/dL — ABNORMAL LOW (ref 3.5–5.0)
Alkaline Phosphatase: 88 U/L (ref 38–126)
Anion gap: 8 (ref 5–15)
BUN: 10 mg/dL (ref 6–20)
CO2: 20 mmol/L — ABNORMAL LOW (ref 22–32)
Calcium: 9.2 mg/dL (ref 8.9–10.3)
Chloride: 108 mmol/L (ref 98–111)
Creatinine, Ser: 0.68 mg/dL (ref 0.44–1.00)
GFR calc Af Amer: >60 mL/min (ref >60)
GFR calc non Af Amer: >60 mL/min (ref >60)
Glucose, Bld: 111 mg/dL — ABNORMAL HIGH (ref 70–99)
Potassium: 3.6 mmol/L (ref 3.5–5.1)
Sodium: 136 mmol/L (ref 135–145)
Total Bilirubin: 0.6 mg/dL (ref 0.3–1.2)
Total Protein: 5.7 g/dL — ABNORMAL LOW (ref 6.5–8.1)

## 2018-07-29 LAB — URINALYSIS, ROUTINE W REFLEX MICROSCOPIC
Bilirubin Urine: NEGATIVE
Glucose, UA: NEGATIVE mg/dL
Hgb urine dipstick: NEGATIVE
Ketones, ur: NEGATIVE mg/dL
Leukocytes,Ua: NEGATIVE
Nitrite: NEGATIVE
Protein, ur: NEGATIVE mg/dL
Specific Gravity, Urine: 1.018 (ref 1.005–1.030)
pH: 6 (ref 5.0–8.0)

## 2018-07-29 NOTE — MAU Note (Addendum)
Having pain RUQ since 0300. Ate chicken fingers and fries last Weds night. Was seen at office today by Dr Marvel Plan and unsure if pain musculoskeletal or ? Gallbladder. Told to come MAU if pain got worse. Denies vag bleeding or LOF. Some abd cramping today. PT states feels SOB is related to pain. Makes pain worse to take deep breath

## 2018-07-29 NOTE — ED Notes (Signed)
Deanna Gross :)

## 2018-07-29 NOTE — MAU Provider Note (Signed)
History     CSN: [id number]  Arrival date and time: 07/29/18 2227   First Provider Initiated Contact with Patient 07/29/18 2301      Chief Complaint  Patient presents with  . Abdominal Pain  . Shortness of Breath   HPI Deanna Gross is a 33 y.o. G1P0000 at [id number] who presents to MAU with chief complaint of RUQ pain. This is a new problem, onset this morning (07/29/2018) around 0300. Patient endorses RUQ which she rates as 6-8/10. Her pain radiates to her right mid-back. She denies aggravating or alleviating factors. She took Tylenol with Pepcid and Tums earlier today but did not experience relief. Patient was seen in clinic around 1pm today and told to present to MAU for evaluation in the event of recurrent and/or  worsening pain.  Patient denies flank pain, urinary symptoms, hx UTI or kidney stones. She endorses family history of "gallbladder problems". She denies vaginal bleeding, leaking of fluid, decreased fetal movement, fever, falls, or recent illness.   OB History    Gravida  1   Para  0   Term  0   Preterm  0   AB  0   Living  0     SAB  0   TAB  0   Ectopic  0   Multiple  0   Live Births  0           Past Medical History:  Diagnosis Date  . Asthma    exercise-induced; prn inhaler  . Headache(784.0)    sinus  . History of MRSA infection 2008   left leg  . Insect bites 08/30/2013  . Seasonal allergies   . Surgical wound, non healing 08/2013   left breast    Past Surgical History:  Procedure Laterality Date  . BREAST CYST EXCISION Left 03/18/2013   Procedure: EXCISE CYST LEFT MEDIAL BREAST;  Surgeon: Velora Hecklerodd M Gerkin, MD;  Location: [facility];  Service: General;  Laterality: Left;  Marland Kitchen. MASS EXCISION Left 09/01/2013   Procedure: RE-EXCISION NON HEALING WOUND LEFT BREAST;  Surgeon: Velora Hecklerodd M Gerkin, MD;  Location: [facility];  Service: General;  Laterality: Left;  . TONSILLECTOMY    . TONSILLECTOMY AND ADENOIDECTOMY    .  TYMPANOSTOMY TUBE PLACEMENT     x 3  . WISDOM TOOTH EXTRACTION      Family History  Problem Relation Age of Onset  . Hyperlipidemia Father   . Hypertension Father   . Depression Father   . Hypothyroidism Mother   . Thyroid disease Mother   . Cancer Maternal Grandfather        leukemia  . Cancer Paternal Grandfather        non-hodgkins lymphoma  . Stroke Maternal Grandmother     Social History   Tobacco Use  . Smoking status: Never Smoker  . Smokeless tobacco: Never Used  Substance Use Topics  . Alcohol use: Yes    Comment: rarely  . Drug use: No    Allergies:  Allergies  Allergen Reactions  . Polytrim [Polymyxin B-Trimethoprim] Other (See Comments)    OPHTHALMIC - FACIAL/EYE SWELLING    Medications Prior to Admission  Medication Sig Dispense Refill Last Dose  . albuterol (PROVENTIL HFA;VENTOLIN HFA) 108 (90 BASE) MCG/ACT inhaler Inhale into the lungs every 6 (six) hours as needed for wheezing or shortness of breath.     . fexofenadine (ALLEGRA) 180 MG tablet Take 180 mg by mouth daily.     .Marland Kitchen  fluticasone (FLONASE) 50 MCG/ACT nasal spray Place into both nostrils daily.     . Prenatal MV-Min-Fe Fum-FA-DHA (PRENATAL 1 PO) Take by mouth.     . progesterone 50 MG/ML injection Inject into the muscle daily.     . sertraline (ZOLOFT) 50 MG tablet Take 1 tablet (50 mg total) by mouth at bedtime. 90 tablet 3     Review of Systems  Constitutional: Negative for chills, fatigue and fever.  Respiratory: Negative for shortness of breath.   Gastrointestinal: Positive for abdominal pain.  Genitourinary: Negative for difficulty urinating, dyspareunia, dysuria, vaginal bleeding, vaginal discharge and vaginal pain.  Musculoskeletal: Negative for back pain.  All other systems reviewed and are negative.  Physical Exam   Blood pressure 124/61, pulse 80, temperature 98 F (36.7 C), resp. rate 20, height 5\' 3"  (1.6 m), weight 105.7 kg, SpO2 97 %.  Physical Exam  Nursing note and  vitals reviewed. Constitutional: She is oriented to person, place, and time. She appears well-developed and well-nourished.  Cardiovascular: Normal rate.  Respiratory: Effort normal. No respiratory distress.  GI: Soft. She exhibits no distension. There is generalized abdominal tenderness. There is no rigidity, no rebound, no guarding, no CVA tenderness and negative Murphy's sign.  Gravid  Musculoskeletal: Normal range of motion.  Neurological: She is alert and oriented to person, place, and time.  Skin: Skin is warm and dry.  Psychiatric: She has a normal mood and affect. Her behavior is normal. Judgment and thought content normal.    MAU Course/MDM  Procedures  --Reactive tracing: baseline 145, moderate variability, positive accels, no decels --Toco: quiet --Slight elevation in WBC with slight left shift. Unable to see previous CBC results for pt baseline --Pertinent negatives: fever, pain associated with high fat meals, focal tenderness --Fetal positioning/location of fetal kicks noted to coincide with locus of pain --Patient declined offer of Tylenol and Flexeril in MAU  Patient Vitals for the past 24 hrs:  BP Temp Pulse Resp SpO2 Height Weight  07/30/18 0000 117/70 - 79 - - - -  07/29/18 2243 124/61 98 F (36.7 C) 80 20 97 % - -  07/29/18 2241 - - - - - 5\' 3"  (1.6 m) 105.7 kg    Results for orders placed or performed during the hospital encounter of 07/29/18 (from the past 24 hour(s))  Urinalysis, Routine w reflex microscopic     Status: Abnormal   Collection Time: 07/29/18 11:00 PM  Result Value Ref Range   Color, Urine YELLOW YELLOW   APPearance HAZY (A) CLEAR   Specific Gravity, Urine 1.018 1.005 - 1.030   pH 6.0 5.0 - 8.0   Glucose, UA NEGATIVE NEGATIVE mg/dL   Hgb urine dipstick NEGATIVE NEGATIVE   Bilirubin Urine NEGATIVE NEGATIVE   Ketones, ur NEGATIVE NEGATIVE mg/dL   Protein, ur NEGATIVE NEGATIVE mg/dL   Nitrite NEGATIVE NEGATIVE   Leukocytes,Ua NEGATIVE  NEGATIVE  CBC with Differential/Platelet     Status: Abnormal   Collection Time: 07/29/18 11:05 PM  Result Value Ref Range   WBC 12.9 (H) 4.0 - 10.5 K/uL   RBC 4.01 3.87 - 5.11 MIL/uL   Hemoglobin 12.4 12.0 - 15.0 g/dL   HCT 40.934.7 (L) 81.136.0 - 91.446.0 %   MCV 86.5 80.0 - 100.0 fL   MCH 30.9 26.0 - 34.0 pg   MCHC 35.7 30.0 - 36.0 g/dL   RDW 78.212.6 95.611.5 - 21.315.5 %   Platelets 229 150 - 400 K/uL   nRBC 0.0 0.0 - 0.2 %  Neutrophils Relative % 69 %   Neutro Abs 9.0 (H) 1.7 - 7.7 K/uL   Lymphocytes Relative 23 %   Lymphs Abs 3.0 0.7 - 4.0 K/uL   Monocytes Relative 6 %   Monocytes Absolute 0.7 0.1 - 1.0 K/uL   Eosinophils Relative 1 %   Eosinophils Absolute 0.1 0.0 - 0.5 K/uL   Basophils Relative 0 %   Basophils Absolute 0.0 0.0 - 0.1 K/uL   Immature Granulocytes 1 %   Abs Immature Granulocytes 0.07 0.00 - 0.07 K/uL  Comprehensive metabolic panel     Status: Abnormal   Collection Time: 07/29/18 11:05 PM  Result Value Ref Range   Sodium 136 135 - 145 mmol/L   Potassium 3.6 3.5 - 5.1 mmol/L   Chloride 108 98 - 111 mmol/L   CO2 20 (L) 22 - 32 mmol/L   Glucose, Bld 111 (H) 70 - 99 mg/dL   BUN 10 6 - 20 mg/dL   Creatinine, Ser 0.68 0.44 - 1.00 mg/dL   Calcium 9.2 8.9 - 10.3 mg/dL   Total Protein 5.7 (L) 6.5 - 8.1 g/dL   Albumin 2.9 (L) 3.5 - 5.0 g/dL   AST 19 15 - 41 U/L   ALT 18 0 - 44 U/L   Alkaline Phosphatase 88 38 - 126 U/L   Total Bilirubin 0.6 0.3 - 1.2 mg/dL   GFR calc non Af Amer >60 >60 mL/min   GFR calc Af Amer >60 >60 mL/min   Anion gap 8 5 - 15   US Abdomen Limited Ruq  Result Date: 07/29/2018 CLINICAL DATA:  Right upper quadrant pain. Thirty-one weeks pregnant. EXAM: ULTRASOUND ABDOMEN LIMITED RIGHT UPPER QUADRANT COMPARISON:  None. FINDINGS: Gallbladder: No gallstones or wall thickening visualized. No sonographic Murphy sign noted by sonographer. Common bile duct: Diameter: Normal caliber, 5 mm. Liver: No focal lesion identified. Within normal limits in parenchymal  echogenicity. Portal vein is patent on color Doppler imaging with normal direction of blood flow towards the liver. IMPRESSION: Normal right upper quadrant ultrasound. Electronically Signed   By: Rolm Baptise M.D.   On: 07/29/2018 23:38   Assessment and Plan  --32 y.o. G1P0000 at [redacted]w[redacted]d  --Reactive tracing --No evidence of gallstones or obstruction on imaging  --Discharge home in stable condition, return to MAU for increasing acuity  F/U: Patient's next OB appt is 08/11/2018  Darlina Rumpf, CNM 07/30/2018, 1:07 AM

## 2018-07-29 NOTE — Discharge Instructions (Signed)
Gallbladder Eating Plan °If you have a gallbladder condition, you may have trouble digesting fats. Eating a low-fat diet can help reduce your symptoms, and may be helpful before and after having surgery to remove your gallbladder (cholecystectomy). Your health care provider may recommend that you work with a diet and nutrition specialist (dietitian) to help you reduce the amount of fat in your diet. °What are tips for following this plan? °General guidelines °· Limit your fat intake to less than 30% of your total daily calories. If you eat around 1,800 calories each day, this is less than 60 grams (g) of fat per day. °· Fat is an important part of a healthy diet. Eating a low-fat diet can make it hard to maintain a healthy body weight. Ask your dietitian how much fat, calories, and other nutrients you need each day. °· Eat small, frequent meals throughout the day instead of three large meals. °· Drink at least 8-10 cups of fluid a day. Drink enough fluid to keep your urine clear or pale yellow. °· Limit alcohol intake to no more than 1 drink a day for nonpregnant women and 2 drinks a day for men. One drink equals 12 oz of beer, 5 oz of wine, or 1½ oz of hard liquor. °Reading food labels °· Check Nutrition Facts on food labels for the amount of fat per serving. Choose foods with less than 3 grams of fat per serving. °Shopping °· Choose nonfat and low-fat healthy foods. Look for the words “nonfat,” “low fat,” or “fat free.” °· Avoid buying processed or prepackaged foods. °Cooking °· Cook using low-fat methods, such as baking, broiling, grilling, or boiling. °· Cook with small amounts of healthy fats, such as olive oil, grapeseed oil, canola oil, or sunflower oil. °What foods are recommended? ° °· All fresh, frozen, or canned fruits and vegetables. °· Whole grains. °· Low-fat or non-fat (skim) milk and yogurt. °· Lean meat, skinless poultry, fish, eggs, and beans. °· Low-fat protein supplement powders or  drinks. °· Spices and herbs. °What foods are not recommended? °· High-fat foods. These include baked goods, fast food, fatty cuts of meat, ice cream, french toast, sweet rolls, pizza, cheese bread, foods covered with butter, creamy sauces, or cheese. °· Fried foods. These include french fries, tempura, battered fish, breaded chicken, fried breads, and sweets. °· Foods with strong odors. °· Foods that cause bloating and gas. °Summary °· A low-fat diet can be helpful if you have a gallbladder condition, or before and after gallbladder surgery. °· Limit your fat intake to less than 30% of your total daily calories. This is about 60 g of fat if you eat 1,800 calories each day. °· Eat small, frequent meals throughout the day instead of three large meals. °This information is not intended to replace advice given to you by your health care provider. Make sure you discuss any questions you have with your health care provider. °Document Released: 01/18/2013 Document Revised: 05/06/2018 Document Reviewed: 02/21/2016 °Elsevier Patient Education © 2020 Elsevier Inc. ° °

## 2018-08-11 MED FILL — SERTRALINE HCL 50 MG TABLET: 50 | 90 days supply | Qty: 90 | Fill #0

## 2018-09-03 LAB — OB RESULTS CONSOLE GBS: GBS: POSITIVE

## 2018-09-16 ENCOUNTER — Encounter (HOSPITAL_COMMUNITY): Payer: Self-pay | Admitting: *Deleted

## 2018-09-16 ENCOUNTER — Inpatient Hospital Stay (HOSPITAL_COMMUNITY)
Admission: AD | Admit: 2018-09-16 | Discharge: 2018-09-19 | DRG: 807 | Disposition: A | Discharge status: Home / Self Care | Payer: No Typology Code available for payment source | Attending: Obstetrics and Gynecology | Admitting: Obstetrics and Gynecology

## 2018-09-16 ENCOUNTER — Other Ambulatory Visit: Payer: Self-pay

## 2018-09-16 DIAGNOSIS — O133 Gestational [pregnancy-induced] hypertension without significant proteinuria, third trimester: Secondary | ICD-10-CM | POA: Diagnosis present

## 2018-09-16 DIAGNOSIS — O1413 Severe pre-eclampsia, third trimester: Secondary | ICD-10-CM

## 2018-09-16 DIAGNOSIS — O99824 Streptococcus B carrier state complicating childbirth: Secondary | ICD-10-CM | POA: Diagnosis present

## 2018-09-16 DIAGNOSIS — F329 Major depressive disorder, single episode, unspecified: Secondary | ICD-10-CM | POA: Diagnosis present

## 2018-09-16 DIAGNOSIS — O99344 Other mental disorders complicating childbirth: Secondary | ICD-10-CM | POA: Diagnosis present

## 2018-09-16 DIAGNOSIS — O1414 Severe pre-eclampsia complicating childbirth: Principal | ICD-10-CM | POA: Diagnosis present

## 2018-09-16 DIAGNOSIS — Z8614 Personal history of Methicillin resistant Staphylococcus aureus infection: Secondary | ICD-10-CM | POA: Diagnosis not present

## 2018-09-16 DIAGNOSIS — F419 Anxiety disorder, unspecified: Secondary | ICD-10-CM | POA: Diagnosis present

## 2018-09-16 DIAGNOSIS — Z20828 Contact with and (suspected) exposure to other viral communicable diseases: Secondary | ICD-10-CM | POA: Diagnosis present

## 2018-09-16 DIAGNOSIS — Z3A38 38 weeks gestation of pregnancy: Secondary | ICD-10-CM | POA: Diagnosis not present

## 2018-09-16 DIAGNOSIS — O134 Gestational [pregnancy-induced] hypertension without significant proteinuria, complicating childbirth: Secondary | ICD-10-CM | POA: Diagnosis present

## 2018-09-16 HISTORY — DX: Depression, unspecified: F32.A

## 2018-09-16 HISTORY — DX: Gestational (pregnancy-induced) hypertension without significant proteinuria, third trimester: O13.3

## 2018-09-16 HISTORY — DX: Severe pre-eclampsia, third trimester: O14.13

## 2018-09-16 HISTORY — DX: Anxiety disorder, unspecified: F41.9

## 2018-09-16 HISTORY — DX: Papillomavirus as the cause of diseases classified elsewhere: B97.7

## 2018-09-16 HISTORY — DX: Unspecified abnormal cytological findings in specimens from vagina: R87.629

## 2018-09-16 HISTORY — DX: Unspecified infectious disease: B99.9

## 2018-09-16 LAB — PROTEIN / CREATININE RATIO, URINE
Creatinine, Urine: 303.7 mg/dL
Protein Creatinine Ratio: 0.06 mg/mg{Cre} (ref 0.00–0.15)
Total Protein, Urine: 18 mg/dL

## 2018-09-16 LAB — COMPREHENSIVE METABOLIC PANEL
ALT: 18 U/L (ref 0–44)
AST: 23 U/L (ref 15–41)
Albumin: 3 g/dL — ABNORMAL LOW (ref 3.5–5.0)
Alkaline Phosphatase: 119 U/L (ref 38–126)
Anion gap: 13 (ref 5–15)
BUN: 13 mg/dL (ref 6–20)
CO2: 18 mmol/L — ABNORMAL LOW (ref 22–32)
Calcium: 9.2 mg/dL (ref 8.9–10.3)
Chloride: 108 mmol/L (ref 98–111)
Creatinine, Ser: 0.78 mg/dL (ref 0.44–1.00)
GFR calc Af Amer: >60 mL/min (ref >60)
GFR calc non Af Amer: >60 mL/min (ref >60)
Glucose, Bld: 85 mg/dL (ref 70–99)
Potassium: 4 mmol/L (ref 3.5–5.1)
Sodium: 139 mmol/L (ref 135–145)
Total Bilirubin: 0.6 mg/dL (ref 0.3–1.2)
Total Protein: 5.9 g/dL — ABNORMAL LOW (ref 6.5–8.1)

## 2018-09-16 LAB — URINALYSIS, ROUTINE W REFLEX MICROSCOPIC
Bilirubin Urine: NEGATIVE
Glucose, UA: NEGATIVE mg/dL
Hgb urine dipstick: NEGATIVE
Ketones, ur: 5 mg/dL — AB
Leukocytes,Ua: NEGATIVE
Nitrite: NEGATIVE
Protein, ur: 30 mg/dL — AB
Specific Gravity, Urine: 1.034 — ABNORMAL HIGH (ref 1.005–1.030)
pH: 5 (ref 5.0–8.0)

## 2018-09-16 LAB — TYPE AND SCREEN
ABO/RH(D): B POS
Antibody Screen: NEGATIVE

## 2018-09-16 LAB — CBC
HCT: 36.8 % (ref 36.0–46.0)
Hemoglobin: 12.8 g/dL (ref 12.0–15.0)
MCH: 30.6 pg (ref 26.0–34.0)
MCHC: 34.8 g/dL (ref 30.0–36.0)
MCV: 88 fL (ref 80.0–100.0)
Platelets: 236 10*3/uL (ref 150–400)
RBC: 4.18 MIL/uL (ref 3.87–5.11)
RDW: 12.8 % (ref 11.5–15.5)
WBC: 11.8 10*3/uL — ABNORMAL HIGH (ref 4.0–10.5)
nRBC: 0 % (ref 0.0–0.2)

## 2018-09-16 LAB — ABO/RH: ABO/RH(D): B POS

## 2018-09-16 LAB — SARS CORONAVIRUS 2 BY RT PCR (HOSPITAL ORDER, PERFORMED IN ~~LOC~~ HOSPITAL LAB): SARS Coronavirus 2: NEGATIVE

## 2018-09-16 MED ORDER — ONDANSETRON HCL 4 MG/2ML IJ SOLN
4.0000 mg | Freq: Four times a day (QID) | INTRAMUSCULAR | Status: DC | PRN
  Administered 2018-09-17 (×2): 4 mg via INTRAVENOUS
  Filled 2018-09-16 (×2): qty 2

## 2018-09-16 MED ORDER — PENICILLIN G 3 MILLION UNITS IVPB - SIMPLE MED
3.0000 10*6.[IU] | INTRAVENOUS | Status: DC
  Administered 2018-09-16 – 2018-09-17 (×4): 3 10*6.[IU] via INTRAVENOUS
  Filled 2018-09-16 (×4): qty 100

## 2018-09-16 MED ORDER — LIDOCAINE HCL (PF) 1 % IJ SOLN: 30.0000 mL | INTRAMUSCULAR | Status: DC | PRN

## 2018-09-16 MED ORDER — LABETALOL HCL 5 MG/ML IV SOLN: 40.0000 mg | INTRAVENOUS | Status: DC | PRN

## 2018-09-16 MED ORDER — BUTORPHANOL TARTRATE 1 MG/ML IJ SOLN
1.0000 mg | INTRAMUSCULAR | Status: DC | PRN
  Administered 2018-09-17 (×2): 1 mg via INTRAVENOUS
  Filled 2018-09-16 (×2): qty 1

## 2018-09-16 MED ORDER — LACTATED RINGERS IV SOLN: INTRAVENOUS | Status: DC

## 2018-09-16 MED ORDER — LABETALOL HCL 5 MG/ML IV SOLN: 80.0000 mg | INTRAVENOUS | Status: DC | PRN

## 2018-09-16 MED ORDER — SODIUM CHLORIDE 0.9 % IV SOLN
5.0000 10*6.[IU] | Freq: Once | INTRAVENOUS | Status: AC
  Administered 2018-09-16: 20:00:00 5 10*6.[IU] via INTRAVENOUS
  Filled 2018-09-16: qty 5

## 2018-09-16 MED ORDER — OXYCODONE-ACETAMINOPHEN 5-325 MG PO TABS: 1.0000 | ORAL_TABLET | ORAL | Status: DC | PRN

## 2018-09-16 MED ORDER — LACTATED RINGERS IV SOLN
INTRAVENOUS | Status: DC
  Administered 2018-09-16 – 2018-09-17 (×3): via INTRAVENOUS

## 2018-09-16 MED ORDER — OXYTOCIN 40 UNITS IN NORMAL SALINE INFUSION - SIMPLE MED: 2.5000 [IU]/h | INTRAVENOUS | Status: DC

## 2018-09-16 MED ORDER — ACETAMINOPHEN 325 MG PO TABS: 650.0000 mg | ORAL_TABLET | ORAL | Status: DC | PRN

## 2018-09-16 MED ORDER — LABETALOL HCL 5 MG/ML IV SOLN: 20.0000 mg | INTRAVENOUS | Status: DC | PRN

## 2018-09-16 MED ORDER — HYDRALAZINE HCL 20 MG/ML IJ SOLN: 10.0000 mg | INTRAMUSCULAR | Status: DC | PRN

## 2018-09-16 MED ORDER — SOD CITRATE-CITRIC ACID 500-334 MG/5ML PO SOLN: 30.0000 mL | ORAL | Status: DC | PRN

## 2018-09-16 MED ORDER — OXYCODONE-ACETAMINOPHEN 5-325 MG PO TABS: 2.0000 | ORAL_TABLET | ORAL | Status: DC | PRN

## 2018-09-16 MED ORDER — OXYTOCIN BOLUS FROM INFUSION
500.0000 mL | Freq: Once | INTRAVENOUS | Status: AC
  Administered 2018-09-17: 15:00:00 500 mL via INTRAVENOUS

## 2018-09-16 MED ORDER — MISOPROSTOL 25 MCG QUARTER TABLET
25.0000 ug | ORAL_TABLET | ORAL | Status: DC | PRN
  Administered 2018-09-16 (×2): 25 ug via VAGINAL
  Filled 2018-09-16 (×2): qty 1

## 2018-09-16 MED ORDER — BUTALBITAL-APAP-CAFFEINE 50-325-40 MG PO TABS: 2.0000 | ORAL_TABLET | Freq: Once | ORAL | Status: DC

## 2018-09-16 MED ORDER — TERBUTALINE SULFATE 1 MG/ML IJ SOLN: 0.2500 mg | Freq: Once | INTRAMUSCULAR | Status: DC | PRN

## 2018-09-16 MED ORDER — LACTATED RINGERS IV SOLN
500.0000 mL | INTRAVENOUS | Status: DC | PRN
  Administered 2018-09-17: 05:00:00 500 mL via INTRAVENOUS

## 2018-09-16 NOTE — H&P (Signed)
Deanna Gross is a 33 y.o. female, G1P0, EGA 38+ weeks with EDC 8-30 presenting for headache and visula changes.  In the office 2 days ago, BP 140/80-90, normal labs.  She was feeling fine until today with above symptoms.  In MAU again has some mildly elevated BP, normal labs, no significant proteinuria.  Conceived by IVF.  Pregnancy otherwise essentially uncomplicated, on zoloft for anxiety.  OB History    Gravida  1   Para  0   Term  0   Preterm  0   AB  0   Living  0     SAB  0   TAB  0   Ectopic  0   Multiple  0   Live Births  0          Past Medical History:  Diagnosis Date  . Anxiety   . Asthma    exercise-induced; prn inhaler  . Depression    on zoloft, doing well, cut dose.  Marland Kitchen Headache(784.0)    sinus  . History of MRSA infection 2008   left leg  . HPV in female   . Infection    UTI  . Insect bites 08/30/2013  . Seasonal allergies   . Surgical wound, non healing 08/2013   left breast  . Vaginal Pap smear, abnormal    Past Surgical History:  Procedure Laterality Date  . BREAST CYST EXCISION Left 03/18/2013   Procedure: EXCISE CYST LEFT MEDIAL BREAST;  Surgeon: Earnstine Regal, MD;  Location: [facility];  Service: General;  Laterality: Left;  Marland Kitchen MASS EXCISION Left 09/01/2013   Procedure: RE-EXCISION NON HEALING WOUND LEFT BREAST;  Surgeon: Earnstine Regal, MD;  Location: [facility];  Service: General;  Laterality: Left;  . TONSILLECTOMY    . TONSILLECTOMY AND ADENOIDECTOMY    . TYMPANOSTOMY TUBE PLACEMENT     x 3  . WISDOM TOOTH EXTRACTION     Family History: family history includes Cancer in her maternal grandfather and paternal grandfather; Depression in her father; Hyperlipidemia in her father; Hypertension in her father; Hypothyroidism in her mother; Stroke in her maternal grandmother; Thyroid disease in her mother. Social History:  reports that she has never smoked. She has never used smokeless tobacco. She reports previous  alcohol use. She reports that she does not use drugs.     Maternal Diabetes: No Genetic Screening: Normal Maternal Ultrasounds/Referrals: Normal Fetal Ultrasounds or other Referrals:  None Maternal Substance Abuse:  No Significant Maternal Medications:  None Significant Maternal Lab Results:  Group B Strep positive Other Comments:  None  Review of Systems  Respiratory: Negative.   Cardiovascular: Negative.    Maternal Medical History:  Contractions: Perceived severity is mild.    Fetal activity: Perceived fetal activity is normal.    Prenatal complications: no prenatal complications Prenatal Complications - Diabetes: none.    Dilation: 1 Effacement (%): 20 Station: -3 Exam by:: lee Blood pressure 132/79, pulse 94, temperature 98.3 F (36.8 C), temperature source Oral, resp. rate 16, height 5\' 3"  (1.6 m), weight 109.8 kg. Maternal Exam:  Uterine Assessment: Contraction strength is mild.  Contraction frequency is irregular.   Abdomen: Patient reports no abdominal tenderness. Estimated fetal weight is 7 lbs.   Fetal presentation: vertex  Introitus: Normal vulva. Normal vagina.  Amniotic fluid character: not assessed.  Pelvis: adequate for delivery.   Cervix: Cervix evaluated by digital exam.     Fetal Exam Fetal Monitor Review: Mode: ultrasound.  Baseline rate: 140.  Variability: moderate (6-25 bpm).   Pattern: accelerations present and no decelerations.    Fetal State Assessment: Category I - tracings are normal.     Physical Exam  Vitals reviewed. Constitutional: She appears well-developed and well-nourished.  Cardiovascular: Normal rate and regular rhythm.  Respiratory: Effort normal. No respiratory distress.  GI: Soft.  Genitourinary:    Vulva normal.     Prenatal labs: ABO, Rh: --/--/B POS, B POS Performed at [facility], [address] [place], [place] [address]  [phone](08/20 1755) Antibody: NEG (08/20 1755) Rubella:  Immune RPR:    NR HBsAg:  neg  HIV:   NR GBS: Positive (08/07 0000)   Assessment/Plan: IUP at 38+ weeks with PIH, +GBS.  Will admit, ripen cervix with cytotec, monitor BP and treat prn, PCN for +GBS   Leighton Roachodd D Julie Nay 09/16/2018, 10:44 PM

## 2018-09-16 NOTE — MAU Note (Signed)
?   Swelling noted when started IV, drawing blood, when removed tourniquet, the swelling reduced.  IV fluids started, no increase in swelling.  Pt states felt better after tourniquet removed.  Will continue to observe.  While securing IV, pt  Became pale and diaphoretic, c/o feeling hot and light headed. Cool cloth applied to neck and forehead.

## 2018-09-16 NOTE — MAU Provider Note (Signed)
Patient Deanna Gross is a 33 y.o.  G1P0000 At [redacted]w[redacted]d here with complaints of headache, blurry vision and tingling. She denies vaginal bleeding, dysuria, vaginal discharge, decreased fetal movements.   She was seen at [facility] on Tuesday (three days ago). She had BPs 140/90s that day, but normal labs. Since then her headache has worsened, and she woke up today with floating spots, blurry vision  and headache on her right side. Her symptoms come and go, but they have been consistent all day long.    She also has RUQ, but she has had that since July. It "feels different" this week.  History     CSN: [id number]  Arrival date and time: 09/16/18 1524   First Provider Initiated Contact with Patient 09/16/18 1721      No chief complaint on file.  Headache  This is a new problem. The current episode started 1 to 4 weeks ago. The problem occurs constantly. The problem has been gradually worsening. The pain is located in the right unilateral region. The pain quality is not similar to prior headaches. Associated symptoms include abdominal pain, blurred vision and a visual change. Pertinent negatives include no nausea or vomiting.    OB History    Gravida  1   Para  0   Term  0   Preterm  0   AB  0   Living  0     SAB  0   TAB  0   Ectopic  0   Multiple  0   Live Births  0           Past Medical History:  Diagnosis Date  . Anxiety   . Asthma    exercise-induced; prn inhaler  . Depression    on zoloft, doing well, cut dose.  Marland Kitchen Headache(784.0)    sinus  . History of MRSA infection 2008   left leg  . HPV in female   . Infection    UTI  . Insect bites 08/30/2013  . Seasonal allergies   . Surgical wound, non healing 08/2013   left breast  . Vaginal Pap smear, abnormal     Past Surgical History:  Procedure Laterality Date  . BREAST CYST EXCISION Left 03/18/2013   Procedure: EXCISE CYST LEFT MEDIAL BREAST;  Surgeon: Earnstine Regal, MD;  Location: [place];  Service: General;  Laterality: Left;  Marland Kitchen MASS EXCISION Left 09/01/2013   Procedure: RE-EXCISION NON HEALING WOUND LEFT BREAST;  Surgeon: Earnstine Regal, MD;  Location: [place];  Service: General;  Laterality: Left;  . TONSILLECTOMY    . TONSILLECTOMY AND ADENOIDECTOMY    . TYMPANOSTOMY TUBE PLACEMENT     x 3  . WISDOM TOOTH EXTRACTION      Family History  Problem Relation Age of Onset  . Hyperlipidemia Father   . Hypertension Father   . Depression Father   . Hypothyroidism Mother   . Thyroid disease Mother   . Cancer Maternal Grandfather        leukemia  . Cancer Paternal Grandfather        non-hodgkins lymphoma  . Stroke Maternal Grandmother     Social History   Tobacco Use  . Smoking status: Never Smoker  . Smokeless tobacco: Never Used  Substance Use Topics  . Alcohol use: Not Currently    Comment: rarely  . Drug use: No    Allergies:  Allergies  Allergen Reactions  . Polytrim [  Polymyxin B-Trimethoprim] Other (See Comments)    OPHTHALMIC - FACIAL/EYE SWELLING    Medications Prior to Admission  Medication Sig Dispense Refill Last Dose  . albuterol (PROVENTIL HFA;VENTOLIN HFA) 108 (90 BASE) MCG/ACT inhaler Inhale into the lungs every 6 (six) hours as needed for wheezing or shortness of breath.     . fexofenadine (ALLEGRA) 180 MG tablet Take 180 mg by mouth daily.     . fluticasone (FLONASE) 50 MCG/ACT nasal spray Place into both nostrils daily.     . Prenatal MV-Min-Fe Fum-FA-DHA (PRENATAL 1 PO) Take by mouth.     . progesterone 50 MG/ML injection Inject into the muscle daily.     . sertraline (ZOLOFT) 50 MG tablet Take 1 tablet (50 mg total) by mouth at bedtime. 90 tablet 3     Review of Systems  HENT: Negative.   Eyes: Positive for blurred vision.  Respiratory: Negative.   Gastrointestinal: Positive for abdominal pain. Negative for nausea and vomiting.  Genitourinary: Negative.   Musculoskeletal: Negative.   Neurological:  Positive for headaches.  Psychiatric/Behavioral: Negative.    Physical Exam   Blood pressure (!) 138/91, pulse 82, temperature 98.2 F (36.8 C), temperature source Oral, resp. rate 18, weight 109.9 kg.  Physical Exam  Constitutional: She appears well-developed and well-nourished.  HENT:  Head: Normocephalic.  Neck: Normal range of motion.  Respiratory: Effort normal.  GI: Soft.  Musculoskeletal: Normal range of motion.  Neurological: She is alert.  Skin: Skin is warm and dry.  Psychiatric: She has a normal mood and affect.    MAU Course  Procedures  MDM -NST: 135-140 bpm, mod var, present acel, neg decels, occasional contractions -will draw pre-e labs,  start IV and call Dr. Jackelyn KnifeMeisinger.  Assessment and Plan   1. Preeclampsia, severe, third trimester    -discussed patients physical complaints and elevated BPs with Dr. Jackelyn KnifeMeisinger and recommended admission. Dr. Jackelyn KnifeMeisinger to put in orders.  -Covid 19 test ordered.   Charlesetta GaribaldiKathryn Lorraine Kooistra 09/16/2018, 5:41 PM

## 2018-09-16 NOTE — MAU Note (Signed)
BP was on Tues, labs were ok, today she woke up with HA and blurring and floaters on rt side, increased swelling, "pinching pain" below rt breast.

## 2018-09-17 ENCOUNTER — Encounter (HOSPITAL_COMMUNITY): Payer: Self-pay | Admitting: Anesthesiology

## 2018-09-17 ENCOUNTER — Inpatient Hospital Stay (HOSPITAL_COMMUNITY): Payer: No Typology Code available for payment source | Admitting: Anesthesiology

## 2018-09-17 LAB — CBC
HCT: 34.5 % — ABNORMAL LOW (ref 36.0–46.0)
HCT: 33.3 % — ABNORMAL LOW (ref 36.0–46.0)
Hemoglobin: 11.7 g/dL — ABNORMAL LOW (ref 12.0–15.0)
Hemoglobin: 11.2 g/dL — ABNORMAL LOW (ref 12.0–15.0)
MCH: 30.2 pg (ref 26.0–34.0)
MCH: 30.3 pg (ref 26.0–34.0)
MCHC: 33.9 g/dL (ref 30.0–36.0)
MCHC: 33.6 g/dL (ref 30.0–36.0)
MCV: 89.1 fL (ref 80.0–100.0)
MCV: 90 fL (ref 80.0–100.0)
Platelets: 204 10*3/uL (ref 150–400)
Platelets: 184 10*3/uL (ref 150–400)
RBC: 3.87 MIL/uL (ref 3.87–5.11)
RBC: 3.7 MIL/uL — ABNORMAL LOW (ref 3.87–5.11)
RDW: 12.8 % (ref 11.5–15.5)
RDW: 12.8 % (ref 11.5–15.5)
WBC: 12.8 10*3/uL — ABNORMAL HIGH (ref 4.0–10.5)
WBC: 15.4 10*3/uL — ABNORMAL HIGH (ref 4.0–10.5)
nRBC: 0 % (ref 0.0–0.2)
nRBC: 0 % (ref 0.0–0.2)

## 2018-09-17 LAB — RPR: RPR Ser Ql: NONREACTIVE

## 2018-09-17 MED ORDER — DIBUCAINE (PERIANAL) 1 % EX OINT: 1.0000 "application " | TOPICAL_OINTMENT | CUTANEOUS | Status: DC | PRN

## 2018-09-17 MED ORDER — ONDANSETRON HCL 4 MG PO TABS: 4.0000 mg | ORAL_TABLET | ORAL | Status: DC | PRN

## 2018-09-17 MED ORDER — SENNOSIDES-DOCUSATE SODIUM 8.6-50 MG PO TABS
2.0000 | ORAL_TABLET | ORAL | Status: DC
  Administered 2018-09-17 – 2018-09-18 (×2): 2 via ORAL
  Filled 2018-09-17 (×2): qty 2

## 2018-09-17 MED ORDER — LACTATED RINGERS IV SOLN: 500.0000 mL | Freq: Once | INTRAVENOUS | Status: DC

## 2018-09-17 MED ORDER — SODIUM CHLORIDE (PF) 0.9 % IJ SOLN
INTRAMUSCULAR | Status: DC | PRN
  Administered 2018-09-17: 12 mL/h via EPIDURAL

## 2018-09-17 MED ORDER — BENZOCAINE-MENTHOL 20-0.5 % EX AERO
1.0000 "application " | INHALATION_SPRAY | CUTANEOUS | Status: DC | PRN
  Administered 2018-09-17: 1 via TOPICAL
  Filled 2018-09-17: qty 56

## 2018-09-17 MED ORDER — PHENYLEPHRINE 40 MCG/ML (10ML) SYRINGE FOR IV PUSH (FOR BLOOD PRESSURE SUPPORT)
80.0000 ug | PREFILLED_SYRINGE | INTRAVENOUS | Status: DC | PRN
  Administered 2018-09-17: 05:00:00 80 ug via INTRAVENOUS
  Filled 2018-09-17 (×2): qty 10

## 2018-09-17 MED ORDER — OXYTOCIN 40 UNITS IN NORMAL SALINE INFUSION - SIMPLE MED
1.0000 m[IU]/min | INTRAVENOUS | Status: DC
  Administered 2018-09-17: 07:00:00 1 m[IU]/min via INTRAVENOUS
  Filled 2018-09-17: qty 1000

## 2018-09-17 MED ORDER — PHENYLEPHRINE 40 MCG/ML (10ML) SYRINGE FOR IV PUSH (FOR BLOOD PRESSURE SUPPORT)
80.0000 ug | PREFILLED_SYRINGE | INTRAVENOUS | Status: DC | PRN
  Administered 2018-09-17: 09:00:00 80 ug via INTRAVENOUS

## 2018-09-17 MED ORDER — WITCH HAZEL-GLYCERIN EX PADS: 1.0000 "application " | MEDICATED_PAD | CUTANEOUS | Status: DC | PRN

## 2018-09-17 MED ORDER — FENTANYL-BUPIVACAINE-NACL 0.5-0.125-0.9 MG/250ML-% EP SOLN
12.0000 mL/h | EPIDURAL | Status: DC | PRN
  Filled 2018-09-17: qty 250

## 2018-09-17 MED ORDER — PRENATAL MULTIVITAMIN CH
1.0000 | ORAL_TABLET | Freq: Every day | ORAL | Status: DC
  Filled 2018-09-17 (×2): qty 1

## 2018-09-17 MED ORDER — SERTRALINE HCL 50 MG PO TABS
50.0000 mg | ORAL_TABLET | Freq: Every day | ORAL | Status: DC
  Filled 2018-09-17 (×2): qty 1

## 2018-09-17 MED ORDER — EPHEDRINE 5 MG/ML INJ: 10.0000 mg | INTRAVENOUS | Status: DC | PRN

## 2018-09-17 MED ORDER — DIPHENHYDRAMINE HCL 50 MG/ML IJ SOLN: 12.5000 mg | INTRAMUSCULAR | Status: DC | PRN

## 2018-09-17 MED ORDER — LIDOCAINE HCL (PF) 1 % IJ SOLN
INTRAMUSCULAR | Status: DC | PRN
  Administered 2018-09-17 (×2): 4 mL via EPIDURAL

## 2018-09-17 MED ORDER — ZOLPIDEM TARTRATE 5 MG PO TABS: 5.0000 mg | ORAL_TABLET | Freq: Every evening | ORAL | Status: DC | PRN

## 2018-09-17 MED ORDER — DIPHENHYDRAMINE HCL 25 MG PO CAPS: 25.0000 mg | ORAL_CAPSULE | Freq: Four times a day (QID) | ORAL | Status: DC | PRN

## 2018-09-17 MED ORDER — SIMETHICONE 80 MG PO CHEW: 80.0000 mg | CHEWABLE_TABLET | ORAL | Status: DC | PRN

## 2018-09-17 MED ORDER — ONDANSETRON HCL 4 MG/2ML IJ SOLN: 4.0000 mg | INTRAMUSCULAR | Status: DC | PRN

## 2018-09-17 MED ORDER — IBUPROFEN 600 MG PO TABS
600.0000 mg | ORAL_TABLET | Freq: Four times a day (QID) | ORAL | Status: DC
  Administered 2018-09-17 – 2018-09-19 (×8): 600 mg via ORAL
  Filled 2018-09-17 (×8): qty 1

## 2018-09-17 MED ORDER — ALBUTEROL SULFATE (2.5 MG/3ML) 0.083% IN NEBU: 2.5000 mg | INHALATION_SOLUTION | Freq: Four times a day (QID) | RESPIRATORY_TRACT | Status: DC | PRN

## 2018-09-17 MED ORDER — ACETAMINOPHEN 325 MG PO TABS: 650.0000 mg | ORAL_TABLET | ORAL | Status: DC | PRN

## 2018-09-17 MED ORDER — COCONUT OIL OIL
1.0000 "application " | TOPICAL_OIL | Status: DC | PRN
  Administered 2018-09-18: 1 via TOPICAL

## 2018-09-17 MED ORDER — TETANUS-DIPHTH-ACELL PERTUSSIS 5-2.5-18.5 LF-MCG/0.5 IM SUSP: 0.5000 mL | Freq: Once | INTRAMUSCULAR | Status: DC

## 2018-09-17 NOTE — Progress Notes (Signed)
Patient ID: Deanna Gross, female   DOB: August 20, 1985, 33 y.o.   MRN: [id number] Pt labored down to a c/c/+3 station and began pushing about 15 minutes ago  FHR currently category 1 but has had some deep variables intermittently that resolved with repositioning  Will follow progress and FHR closely

## 2018-09-17 NOTE — Anesthesia Procedure Notes (Signed)
Epidural Patient location during procedure: OB Start time: 09/17/2018 4:57 AM End time: 09/17/2018 5:05 AM  Staffing Anesthesiologist: Josephine Igo, MD Performed: anesthesiologist   Preanesthetic Checklist Completed: patient identified, site marked, surgical consent, pre-op evaluation, timeout performed, IV checked, risks and benefits discussed and monitors and equipment checked  Epidural Patient position: sitting Prep: site prepped and draped and DuraPrep Patient monitoring: continuous pulse ox and blood pressure Approach: midline Location: L3-L4 Injection technique: LOR air  Needle:  Needle type: Tuohy  Needle gauge: 17 G Needle length: 9 cm and 9 Needle insertion depth: 6 cm Catheter type: closed end flexible Catheter size: 19 Gauge Catheter at skin depth: 11 cm Test dose: negative and Other  Assessment Events: blood not aspirated, injection not painful, no injection resistance, negative IV test and no paresthesia  Additional Notes Patient identified. Risks and benefits discussed including failed block, incomplete  Pain control, post dural puncture headache, nerve damage, paralysis, blood pressure Changes, nausea, vomiting, reactions to medications-both toxic and allergic and post Partum back pain. All questions were answered. Patient expressed understanding and wished to proceed. Sterile technique was used throughout procedure. Epidural site was Dressed with sterile barrier dressing. No paresthesias, signs of intravascular injection Or signs of intrathecal spread were encountered.  Patient was more comfortable after the epidural was dosed. Please see RN's note for documentation of vital signs and FHR which are stable. Reason for block:procedure for pain

## 2018-09-17 NOTE — Progress Notes (Signed)
Patient ID: Deanna Gross, female   DOB: 05/31/1985, 33 y.o.   MRN: [id number] Pt received 2 doses of cytotec and started on pitocin overnight, currently at 76mu She received an epidural about 0500am and is comfortable with some pressure  FHR has overall been category 1 with some runs of mild variables Examined at 0820 and c/8-9/0 with BBOW AROM clear  After AROM baby had a run of variable decelerations to the 90's but pt BP also noted to be 100/60 with her feeling faint so given bolus and one dose of phenylephrine  After phenylephrine, BP back to her baseline of 130/80-90 and FHR now back to category 1  Will follow closely

## 2018-09-17 NOTE — Anesthesia Preprocedure Evaluation (Signed)
Anesthesia Evaluation  Patient identified by MRN, date of birth, ID band Patient awake    Reviewed: Allergy & Precautions, Patient's Chart, lab work & pertinent test results  Airway Mallampati: II  TM Distance: >3 FB Neck ROM: Full    Dental no notable dental hx. (+) Teeth Intact   Pulmonary asthma ,    Pulmonary exam normal breath sounds clear to auscultation       Cardiovascular negative cardio ROS Normal cardiovascular exam Rhythm:Regular Rate:Normal     Neuro/Psych  Headaches, PSYCHIATRIC DISORDERS Anxiety Depression    GI/Hepatic Neg liver ROS, GERD  ,  Endo/Other  Morbid obesity  Renal/GU negative Renal ROS  negative genitourinary   Musculoskeletal negative musculoskeletal ROS (+)   Abdominal   Peds  Hematology  (+) anemia ,   Anesthesia Other Findings   Reproductive/Obstetrics (+) Pregnancy                             Anesthesia Physical Anesthesia Plan  ASA: III  Anesthesia Plan: Epidural   Post-op Pain Management:    Induction:   PONV Risk Score and Plan:   Airway Management Planned: Natural Airway  Additional Equipment:   Intra-op Plan:   Post-operative Plan:   Informed Consent: I have reviewed the patients History and Physical, chart, labs and discussed the procedure including the risks, benefits and alternatives for the proposed anesthesia with the patient or authorized representative who has indicated his/her understanding and acceptance.       Plan Discussed with: Anesthesiologist  Anesthesia Plan Comments:         Anesthesia Quick Evaluation

## 2018-09-18 LAB — CBC
HCT: 33.5 % — ABNORMAL LOW (ref 36.0–46.0)
Hemoglobin: 11.2 g/dL — ABNORMAL LOW (ref 12.0–15.0)
MCH: 30.2 pg (ref 26.0–34.0)
MCHC: 33.4 g/dL (ref 30.0–36.0)
MCV: 90.3 fL (ref 80.0–100.0)
Platelets: 191 10*3/uL (ref 150–400)
RBC: 3.71 MIL/uL — ABNORMAL LOW (ref 3.87–5.11)
RDW: 13.1 % (ref 11.5–15.5)
WBC: 12.9 10*3/uL — ABNORMAL HIGH (ref 4.0–10.5)
nRBC: 0 % (ref 0.0–0.2)

## 2018-09-18 NOTE — Progress Notes (Signed)
MOB was referred for history of depression/anxiety. * Referral screened out by Clinical Social Worker because none of the following criteria appear to apply: ~ History of anxiety/depression during this pregnancy, or of post-partum depression following prior delivery. ~ Diagnosis of anxiety and/or depression within last 3 years OR * MOB's symptoms currently being treated with medication and/or therapy. Per chart review, MOB is currently taking/prescribed Zoloft.  Please contact the Clinical Social Worker if needs arise, by MOB request, or if MOB scores greater than 9/yes to question 10 on Edinburgh Postpartum Depression Screen.  Shardee Dieu, LCSW Clinical Social Worker [facility] Cell#: [phone]  

## 2018-09-18 NOTE — Anesthesia Postprocedure Evaluation (Signed)
Anesthesia Post Note  Patient: Deanna Gross  Procedure(s) Performed: AN AD HOC LABOR EPIDURAL     Patient location during evaluation: Mother Baby Anesthesia Type: Epidural Level of consciousness: awake and alert, oriented and patient cooperative Pain management: pain level controlled Vital Signs Assessment: post-procedure vital signs reviewed and stable Respiratory status: spontaneous breathing Cardiovascular status: stable Postop Assessment: no headache, epidural receding, patient able to bend at knees and no signs of nausea or vomiting Anesthetic complications: no Comments: Pt. Interviewed via phone call d/t COVID 19 precautions.  Pt. States she is walking. Pain score 1.    Last Vitals:  Vitals:   09/17/18 2111 09/18/18 0624  BP: 126/64 130/84  Pulse:    Resp: 18 18  Temp: 36.8 C 36.9 C  SpO2:      Last Pain:  Vitals:   09/18/18 0626  TempSrc:   PainSc: 1    Pain Goal:                   [facility]

## 2018-09-18 NOTE — Progress Notes (Signed)
Post Partum Day 1 Subjective: no complaints, up ad lib and tolerating PO  Objective: Blood pressure 130/84, pulse 77, temperature 98.5 F (36.9 C), temperature source Oral, resp. rate 18, height 5\' 3"  (1.6 m), weight 109.8 kg, SpO2 99 %, unknown if currently breastfeeding.  Physical Exam:  General: alert and cooperative Lochia: appropriate Uterine Fundus: firm   Recent Labs    09/17/18 1539 09/18/18 0459  HGB 11.2* 11.2*  HCT 33.3* 33.5*    Assessment/Plan: Plan for discharge tomorrow  BP WNL thus far following delivery   LOS: 2 days   Logan Bores 09/18/2018, 8:22 AM

## 2018-09-18 NOTE — Lactation Note (Addendum)
This note was copied from a baby's chart. Lactation Consultation Note  Patient Name: Deanna Gross WERXV'[id number] Date: 09/18/2018  P1, 46 hour female infant, weight loss -1%. Infant had one void and 3 stools. 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  Mom feels breastfeeding is getting better now infant is latching at breast with 20 mm NS.  LC did not observe latch at this time, infant  is not due another feeding until 11/2 hours, mom  was holding infant  while sitting in a chair. Per mom, infant is latching at breast with 20 mm NS she pre-fill NS with small amount of formula and infant sustains latch and breastfeds  for 10 minutes or more now. LC reminded mom to undress infant and do STS while breastfeeding, rubbing and tickling infant to stay stimulated to breastfeed longer. Mom will work on increasing breastfeeding duration 15-20 minutes or longer. Mom plans to supplement  infant after breastfeeding her one ml of EBM mixed with  with 14 ml of Similac Advance with iron 20 kcal  At next feeding using slow flow bottle nipple. Mom was given DEBP by first shift Nurse and mom understands and demonstrated how to use DEBP to [place].  Mom has started using DEBP every 3 hours for 15 minutes and pleased she is starting see colostrum when pumping. Mom knows to breastfeed infant according hunger cues, 8 to 12 times within 24 hours and on demand. Mom knows to call Nurse or [place] if she has any further questions, concerns or need assistance with latching infant to breast.    Maternal Data    Feeding Feeding Type: Bottle Fed - Formula Nipple Type: Slow - flow  LATCH Score                   Interventions    Lactation Tools Discussed/Used     Consult Status      Deanna Gross 09/18/2018, 8:48 PM

## 2018-09-18 NOTE — Lactation Note (Addendum)
This note was copied from a baby's chart. Lactation Consultation Note  Patient Name: Deanna Gross OINOM'V Date: 09/18/2018 Reason for consult: Initial assessment;1st time breastfeeding;Early term 37-38.6wks;Nipple pain/trauma P1, 15 hour female ETI infant. Mom current feeding choice is breast and formula feeding. Mom knows about formula risk LEAD. Mom with hx: IVF, GHTN and Zoloft.  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 Per mom, infant attempted latch in L&D mom has suck bruises on each breast where infant was not latched properly. Per mom, she was told it was okay for infant to sleep mom has not latched infant to breast since 5 pm yesterday infant went 12 hours without feeding at breast. LC discussed with mom infant should breastfeed according hunger cues, 8 to 12 times with 24 hours and on demand. LC discussed hunger cues with mom and sheet given that visually shows hunger cues. Mom taught back hand expression and small amount of colostrum present, infant attempted to latch infant was fussy, hungry  and not consolable. LC notice mom has flat and short shaft nipples. [place] asked mom do breast stimulation prior to latch infant, infant was fussy and would not sustain latch after repeated attempts. [place] fitted mom with 20 mm NS mom understands NS are temporary and not for long term usage, NS was pre-filled with formula Similac 20 kcal with iron. Infant sustained latch and breastfeed for 15 minutes with 20 mm NS and took 6 ml of formula at breast with curve tip syringe. Mom knows to call Nurse or [place] if she needs assistance with latching infant to breast. Mom will wear breast shells in bra during the day to help evert nipple shaft out more due flat and short shafted nipples.  Mom shown how to use hand pump & how to disassemble, clean, & reassemble parts. Reviewed Baby & Me book's Breastfeeding Basics.  Mom made aware of O/P services,  breastfeeding support groups, community resources, and our phone # for post-discharge questions.   Mom's Plan: 1. Mom will latch infant to breast according hunger cues, 8 to 12 times within 24 hours. 2. Mom will pre-pump and do breast stimulation prior to applying 20 mm NS and latching infant to breast. 3. Mom will supplement infant at breast  with curve tip syringe of EBM or formula based on infant's age/ hours of life. 4. Mom will continue attempting to latch infant at breast  without NS.  Maternal Data Formula Feeding for Exclusion: No Has patient been taught Hand Expression?: Yes(mom has colostrum present)  Feeding Feeding Type: Breast Fed  LATCH Score Latch: Repeated attempts needed to sustain latch, nipple held in mouth throughout feeding, stimulation needed to elicit sucking reflex.  Audible Swallowing: A few with stimulation  Type of Nipple: Everted at rest and after stimulation  Comfort (Breast/Nipple): Filling, red/small blisters or bruises, mild/mod discomfort  Hold (Positioning): Assistance needed to correctly position infant at breast and maintain latch.  LATCH Score: 6  Interventions Interventions: Breast feeding basics reviewed;Breast compression;Adjust position;Assisted with latch;Skin to skin;Support pillows;Hand pump;Position options;Breast massage;Hand express;Expressed milk;Pre-pump if needed;Shells;Coconut oil  Lactation Tools Discussed/Used Tools: Shells;Nipple Shields Nipple shield size: 20 Shell Type: (flat and semi short shafted.) WIC Program: No Pump Review: Setup, frequency, and cleaning Initiated by:: Vicente Serene, IBCLC Date initiated:: 09/18/18   Consult Status Consult Status: Follow-up Date: 09/18/18 Follow-up type: In-patient    Vicente Serene 09/18/2018, 6:11 AM

## 2018-09-19 ENCOUNTER — Encounter (HOSPITAL_COMMUNITY): Payer: Self-pay

## 2018-09-19 MED ORDER — IBUPROFEN 600 MG PO TABS: 600.0000 mg | ORAL_TABLET | Freq: Four times a day (QID) | ORAL | 0 refills | Status: DC

## 2018-09-19 MED ORDER — ACETAMINOPHEN 325 MG PO TABS: 650.0000 mg | ORAL_TABLET | ORAL | 0 refills | Status: DC | PRN

## 2018-09-19 NOTE — Lactation Note (Signed)
This note was copied from a baby's chart. Lactation Consultation Note  Patient Name: Deanna Gross EPPIR'[id number] Date: 09/19/2018 Reason for consult: Follow-up assessment;Infant weight loss;Early term 37-38.6wks(baby sleeping)  Baby 21 hours old  As LC entered the room mom sitting up in bed holding her baby. Mom mentioned she had been feeding her baby a bottle due to dad having to leave and she felt more comfort latching when he was there.  Per mom mentioned she had been using the #20 NS to latch and instilling formula in the top and latching , supplementing afterwards. Has used the DEBP x 2 in the last 24 hours with some breast milk.  LC reviewed the recommended LC plan - to feed the baby at the breast 1st and then supplement afterwards and pump both breast for supplementing.  When supplementing or feeding the baby a bottle for a feeding baby should receive at least 30 ml and not to feed hourly small amounts.  LC reviewed supply and demand and the importance of consistent pumping whether baby latches or not.    LC offered to request a LC O/P this week and mom receptive. LC placed a request in the Epic basket for the clinic.    Maternal Data Has patient been taught Hand Expression?: Yes  Feeding Feeding Type: (fed last at 0730) Nipple Type: Slow - flow  LATCH Score                   Interventions Interventions: Breast feeding basics reviewed  Lactation Tools Discussed/Used Tools: Pump;Shells;Coconut oil;Comfort gels Nipple shield size: 20 Shell Type: Inverted Breast pump type: Double-Electric Breast Pump Pump Review: Milk Storage   Consult Status Consult Status: Follow-up Follow-up type: [place] 09/19/2018, 10:08 AM

## 2018-09-19 NOTE — Progress Notes (Signed)
[name]: no complaints, up ad lib and tolerating PO  Objective: Blood pressure 122/65, pulse 63, temperature 98.4 F (36.9 C), temperature source Oral, resp. rate 17, height 5\' 3"  (1.6 m), weight 109.8 kg, SpO2 98 %, unknown if currently breastfeeding.  Physical Exam:  General: alert and cooperative Lochia: appropriate Uterine Fundus: firm   Recent Labs    09/17/18 1539 09/18/18 0459  HGB 11.2* 11.2*  HCT 33.3* 33.5*    Assessment/Plan: Discharge home  BP has been acceptable but will bring to office in 5 days to recheck   LOS: 3 days   Deanna Gross 09/19/2018, 10:04 AM

## 2018-09-19 NOTE — Discharge Summary (Signed)
OB Discharge Summary     Patient Name: Deanna Gross DOB: [birth year] MRN: [id number]  Date of admission: 09/16/2018 Delivering MD: Huel CoteICHARDSON, Sherilyn Windhorst   Date of discharge: 09/19/2018  Admitting diagnosis: Pre E monitoring Intrauterine pregnancy: 6716w5d     Secondary diagnosis:  Active Problems:   Preeclampsia, severe, third trimester   PIH (pregnancy induced hypertension), third trimester   NSVD (normal spontaneous vaginal delivery)  Additional problems: none     Discharge diagnosis: Term Pregnancy Delivered                                                                                                Post partum procedures:none  Augmentation: AROM, Pitocin and Cytotec  Complications: None  Hospital course:  Induction of Labor With Vaginal Delivery   33 y.o. yo G1P1001 at 3816w5d was admitted to the hospital 09/16/2018 for induction of labor.  Indication for induction: Gestational hypertension.  Patient had an uncomplicated labor course as follows: Membrane Rupture Time/Date: 8:20 AM ,09/17/2018   Intrapartum Procedures: Episiotomy: None [1]                                         Lacerations:  2nd degree [3]  Patient had delivery of a Viable infant.  Information for the patient's newborn:  Natasha BenceSkeen, Girl Mimie [[id number]][[id number]]  Delivery Method: Vaginal, Spontaneous(Filed from Delivery Summary)    09/17/2018  Details of delivery can be found in separate delivery note.  Patient had a routine postpartum course. Patient is discharged home 09/19/18.  Physical exam  Vitals:   09/18/18 0624 09/18/18 1500 09/18/18 2200 09/19/18 0507  BP: 130/84 (!) 139/91 133/90 122/65  Pulse:  73 88 63  Resp: 18 18 17 17   Temp: 98.5 F (36.9 C) 98.4 F (36.9 C) 99.1 F (37.3 C) 98.4 F (36.9 C)  TempSrc: Oral Oral Oral Oral  SpO2:   99% 98%  Weight:      Height:       General: alert and cooperative Lochia: appropriate Uterine Fundus: firm  Labs: Lab Results  Component Value Date   WBC 12.9  (H) 09/18/2018   HGB 11.2 (L) 09/18/2018   HCT 33.5 (L) 09/18/2018   MCV 90.3 09/18/2018   PLT 191 09/18/2018   CMP Latest Ref Rng & Units 09/16/2018  Glucose 70 - 99 mg/dL 85  BUN 6 - 20 mg/dL 13  Creatinine 6.290.44 - 5.281.00 mg/dL 4.130.78  Sodium 244135 - 010145 mmol/L 139  Potassium 3.5 - 5.1 mmol/L 4.0  Chloride 98 - 111 mmol/L 108  CO2 22 - 32 mmol/L 18(L)  Calcium 8.9 - 10.3 mg/dL 9.2  Total Protein 6.5 - 8.1 g/dL 5.9(L)  Total Bilirubin 0.3 - 1.2 mg/dL 0.6  Alkaline Phos 38 - 126 U/L 119  AST 15 - 41 U/L 23  ALT 0 - 44 U/L 18    Discharge instruction: per After Visit Summary and "Baby and Me Booklet".  After visit meds:  Allergies as of 09/19/2018  Reactions   Polytrim [polymyxin B-trimethoprim] Other (See Comments)   OPHTHALMIC - FACIAL/EYE SWELLING      Medication List    STOP taking these medications   progesterone 50 MG/ML injection     TAKE these medications   acetaminophen 325 MG tablet Commonly known as: Tylenol Take 2 tablets (650 mg total) by mouth every 4 (four) hours as needed (for pain scale < 4).   albuterol 108 (90 Base) MCG/ACT inhaler Commonly known as: VENTOLIN HFA Inhale into the lungs every 6 (six) hours as needed for wheezing or shortness of breath.   fexofenadine 180 MG tablet Commonly known as: ALLEGRA Take 180 mg by mouth daily.   fluticasone 50 MCG/ACT nasal spray Commonly known as: FLONASE Place into both nostrils daily.   ibuprofen 600 MG tablet Commonly known as: ADVIL Take 1 tablet (600 mg total) by mouth every 6 (six) hours.   PRENATAL 1 PO Take by mouth.   sertraline 50 MG tablet Commonly known as: ZOLOFT Take 1 tablet (50 mg total) by mouth at bedtime.       Diet: routine diet  Activity: Advance as tolerated. Pelvic rest for 6 weeks.   Outpatient follow up:5 days Follow up Appt:No future appointments. Follow up Visit:No follow-ups on file.  Postpartum contraception: Undecided  Newborn Data: Live born female   Birth Weight: 7 lb 2.1 oz (3235 g) APGAR: 8, 9  Newborn Delivery   Birth date/time: 09/17/2018 14:33:00 Delivery type: Vaginal, Spontaneous      Baby Feeding: Breast Disposition:home with mother   09/19/2018 Logan Bores, MD

## 2018-09-23 ENCOUNTER — Inpatient Hospital Stay (HOSPITAL_COMMUNITY): Payer: No Typology Code available for payment source

## 2018-09-23 ENCOUNTER — Inpatient Hospital Stay (HOSPITAL_COMMUNITY)
Admission: AD | Admit: 2018-09-23 | Payer: No Typology Code available for payment source | Source: Home / Self Care | Admitting: Obstetrics and Gynecology

## 2018-09-24 MED FILL — AZITHROMYCIN 250 MG TABS: 250 | 5 days supply | Qty: 6 | Fill #0

## 2018-09-26 ENCOUNTER — Inpatient Hospital Stay (HOSPITAL_COMMUNITY): Admission: AD | Admit: 2018-09-26 | Payer: No Typology Code available for payment source | Source: Home / Self Care

## 2018-10-27 MED FILL — LARIN FE 1.5-30 TABLET: 1.5-30 | 84 days supply | Qty: 84 | Fill #0

## 2018-11-12 ENCOUNTER — Ambulatory Visit (INDEPENDENT_AMBULATORY_CARE_PROVIDER_SITE_OTHER): Payer: No Typology Code available for payment source | Admitting: Physician Assistant

## 2018-11-12 DIAGNOSIS — Z23 Encounter for immunization: Secondary | ICD-10-CM | POA: Diagnosis not present

## 2018-12-02 ENCOUNTER — Telehealth: Payer: No Typology Code available for payment source | Admitting: Physician Assistant

## 2018-12-02 DIAGNOSIS — L301 Dyshidrosis [pompholyx]: Secondary | ICD-10-CM | POA: Diagnosis not present

## 2018-12-02 DIAGNOSIS — L309 Dermatitis, unspecified: Secondary | ICD-10-CM

## 2018-12-02 MED ORDER — FLUOCINONIDE EMULSIFIED BASE 0.05 % EX CREA: 1.0000 "application " | TOPICAL_CREAM | Freq: Three times a day (TID) | CUTANEOUS | 0 refills | Status: AC

## 2018-12-02 MED FILL — SERTRALINE HCL 50 MG TABLET: 50 | 90 days supply | Qty: 90 | Fill #1

## 2018-12-02 NOTE — Addendum Note (Signed)
Addended by: Margarita Mail on: 12/02/2018 05:25 PM   Modules accepted: Orders

## 2018-12-02 NOTE — Progress Notes (Signed)
I spent 4min on this E-Visit  E Visit for Rash  We are sorry that you are not feeling well. Here is how we plan to help!  Based on what you shared with me it looks like you have contact dermatitis.  Contact dermatitis is a skin rash caused by something that touches the skin and causes irritation or inflammation.  Your skin may be red, swollen, dry, cracked, and itch.  The rash should go away in a few days but can last a few weeks.  If you get a rash, it's important to figure out what caused it so the irritant can be avoided in the future. I have ordered Lidex to be used daily. Please see dermatology or your primary MD if not improving.           HOME CARE:   Take cool showers and avoid direct sunlight.  Apply cool compress or wet dressings.  Take a bath in an oatmeal bath.  Sprinkle content of one Aveeno packet under running faucet with comfortably warm water.  Bathe for 15-20 minutes, 1-2 times daily.  Pat dry with a towel. Do not rub the rash.  Use hydrocortisone cream.  Take an antihistamine like Benadryl for widespread rashes that itch.  The adult dose of Benadryl is 25-50 mg by mouth 4 times daily.  Caution:  This type of medication may cause sleepiness.  Do not drink alcohol, drive, or operate dangerous machinery while taking antihistamines.  Do not take these medications if you have prostate enlargement.  Read package instructions thoroughly on all medications that you take.  GET HELP RIGHT AWAY IF:   Symptoms don't go away after treatment.  Severe itching that persists.  If you rash spreads or swells.  If you rash begins to smell.  If it blisters and opens or develops a yellow-brown crust.  You develop a fever.  You have a sore throat.  You become short of breath.  MAKE SURE YOU:  Understand these instructions. Will watch your condition. Will get help right away if you are not doing well or get worse.  Thank you for choosing an e-visit. Your e-visit  answers were reviewed by a board certified advanced clinical practitioner to complete your personal care plan. Depending upon the condition, your plan could have included both over the counter or prescription medications. Please review your pharmacy choice. Be sure that the pharmacy you have chosen is open so that you can pick up your prescription now.  If there is a problem you may message your provider in [place] to have the prescription routed to another pharmacy. Your safety is important to [place]. If you have drug allergies check your prescription carefully.  For the next 24 hours, you can use MyChart to ask questions about today's visit, request a non-urgent call back, or ask for a work or school excuse from your e-visit provider. You will get an email in the next two days asking about your experience. I hope that your e-visit has been valuable and will speed your recovery.

## 2019-03-03 MED FILL — SERTRALINE HCL 50 MG TABLET: 50 | 90 days supply | Qty: 90 | Fill #2

## 2019-05-31 MED FILL — SERTRALINE HCL 50 MG TABS: 50 | 90 days supply | Qty: 90 | Fill #3

## 2019-07-26 ENCOUNTER — Ambulatory Visit (INDEPENDENT_AMBULATORY_CARE_PROVIDER_SITE_OTHER): Payer: No Typology Code available for payment source | Admitting: Osteopathic Medicine

## 2019-07-26 ENCOUNTER — Other Ambulatory Visit: Payer: Self-pay | Admitting: Osteopathic Medicine

## 2019-07-26 ENCOUNTER — Other Ambulatory Visit (HOSPITAL_COMMUNITY)
Admission: RE | Admit: 2019-07-26 | Discharge: 2019-07-26 | Disposition: A | Discharge status: Home / Self Care | Payer: No Typology Code available for payment source | Source: Ambulatory Visit | Attending: Osteopathic Medicine | Admitting: Osteopathic Medicine

## 2019-07-26 ENCOUNTER — Encounter: Payer: Self-pay | Admitting: Osteopathic Medicine

## 2019-07-26 VITALS — BP 104/68 | HR 60 | Temp 98.0°F | Wt 232.0 lb

## 2019-07-26 DIAGNOSIS — Z8759 Personal history of other complications of pregnancy, childbirth and the puerperium: Secondary | ICD-10-CM | POA: Insufficient documentation

## 2019-07-26 DIAGNOSIS — Z124 Encounter for screening for malignant neoplasm of cervix: Secondary | ICD-10-CM

## 2019-07-26 DIAGNOSIS — Z8742 Personal history of other diseases of the female genital tract: Secondary | ICD-10-CM | POA: Insufficient documentation

## 2019-07-26 DIAGNOSIS — F411 Generalized anxiety disorder: Secondary | ICD-10-CM | POA: Insufficient documentation

## 2019-07-26 DIAGNOSIS — Z0000 Encounter for general adult medical examination without abnormal findings: Secondary | ICD-10-CM

## 2019-07-26 DIAGNOSIS — J329 Chronic sinusitis, unspecified: Secondary | ICD-10-CM

## 2019-07-26 HISTORY — DX: Personal history of other diseases of the female genital tract: Z87.42

## 2019-07-26 HISTORY — DX: Personal history of other complications of pregnancy, childbirth and the puerperium: Z87.59

## 2019-07-26 MED ORDER — FEXOFENADINE HCL 180 MG PO TABS: 180.0000 mg | ORAL_TABLET | Freq: Every day | ORAL | 3 refills | Status: DC

## 2019-07-26 MED ORDER — ALBUTEROL SULFATE HFA 108 (90 BASE) MCG/ACT IN AERS: 1.0000 | INHALATION_SPRAY | Freq: Four times a day (QID) | RESPIRATORY_TRACT | 99 refills | Status: DC | PRN

## 2019-07-26 MED ORDER — SERTRALINE HCL 50 MG PO TABS: 50.0000 mg | ORAL_TABLET | Freq: Every day | ORAL | 3 refills | Status: DC

## 2019-07-26 NOTE — Patient Instructions (Addendum)
General Preventive Care  Most recent routine screening labs: ordered.   Blood pressure goal 130/80 or less.   Tobacco: don't! Alcohol: responsible moderation is ok for most adults - if you have concerns about your alcohol intake, please talk to me!   Exercise: as tolerated to reduce risk of cardiovascular disease and diabetes. Strength training will also prevent osteoporosis.   Mental health: if need for mental health care (medicines, counseling, other), or concerns about moods, please let me know!   Sexual / Reproductive health: if need for STD testing, or if concerns with libido/pain problems, please let me know! If you need to discuss family planning, please let me or OBGYN know!   Advanced Directive: Living Will and/or Healthcare Power of Attorney recommended for all adults, regardless of age or health.  Vaccines  Flu vaccine: for almost everyone, every fall.   Shingles vaccine: after age 51.   Pneumonia vaccines: after age 66  Tetanus booster: every 10 years, due 2030  HPV vaccine: Gardasil up to age 14   COVID vaccine: THANKS for getting your vaccine! :)  Cancer screenings   Colon cancer screening: for everyone age 41-75. Colonoscopy available for all, many people also qualify for the Cologuard stool test   Breast cancer screening: mammogram at age 44   Cervical cancer screening: Pap due!   Lung cacer screening: not needed for non-smokers  Infection screenings   HIV: recommended screening at least once age 55-65, more often as needed.  Gonorrhea/Chlamydia: screening as needed, though many insurances require testing for anyone on birth control pills.  Hepatitis C: recommended once for everyone age 45-75  TB: certain at-risk populations, or depending on work requirements and/or travel history Other  Bone Density Test: recommended for women at age 55

## 2019-07-26 NOTE — Progress Notes (Signed)
Deanna Gross is a 34 y.o. female who presents to  [facility] at [facility]  today, 07/26/19, seeking care for the following:   Annual physical   Pap      ASSESSMENT & PLAN with other pertinent findings:  The primary encounter diagnosis was Annual physical exam. Diagnoses of Generalized anxiety disorder, History of abnormal cervical Pap smear, Cervical cancer screening, History of pre-eclampsia, and Chronic congestion of paranasal sinus were also pertinent to this visit.   No results found for this or any previous visit (from the past 24 hour(s)).     Patient Instructions  General Preventive Care  Most recent routine screening labs: ordered.   Blood pressure goal 130/80 or less.   Tobacco: don't! Alcohol: responsible moderation is ok for most adults - if you have concerns about your alcohol intake, please talk to me!   Exercise: as tolerated to reduce risk of cardiovascular disease and diabetes. Strength training will also prevent osteoporosis.   Mental health: if need for mental health care (medicines, counseling, other), or concerns about moods, please let me know!   Sexual / Reproductive health: if need for STD testing, or if concerns with libido/pain problems, please let me know! If you need to discuss family planning, please let me or OBGYN know!   Advanced Directive: Living Will and/or Healthcare Power of Attorney recommended for all adults, regardless of age or health.  Vaccines  Flu vaccine: for almost everyone, every fall.   Shingles vaccine: after age 80.   Pneumonia vaccines: after age 9  Tetanus booster: every 10 years, due 2030  HPV vaccine: Gardasil up to age 26   COVID vaccine: THANKS for getting your vaccine! :)  Cancer screenings   Colon cancer screening: for everyone age 20-75. Colonoscopy available for all, many people also qualify for the Cologuard stool test   Breast cancer screening: mammogram  at age 74   Cervical cancer screening: Pap due!   Lung cacer screening: not needed for non-smokers  Infection screenings   HIV: recommended screening at least once age 65-65, more often as needed.  Gonorrhea/Chlamydia: screening as needed, though many insurances require testing for anyone on birth control pills.  Hepatitis C: recommended once for everyone age 4-75  TB: certain at-risk populations, or depending on work requirements and/or travel history Other  Bone Density Test: recommended for women at age 57   Orders Placed This Encounter  Procedures   CBC   COMPLETE METABOLIC PANEL WITH GFR   Lipid panel   Ambulatory referral to ENT    Meds ordered this encounter  Medications   sertraline (ZOLOFT) 50 MG tablet    Sig: Take 1 tablet (50 mg total) by mouth at bedtime.    Dispense:  90 tablet    Refill:  3   fexofenadine (ALLEGRA) 180 MG tablet    Sig: Take 1 tablet (180 mg total) by mouth daily.    Dispense:  90 tablet    Refill:  3   albuterol (VENTOLIN HFA) 108 (90 Base) MCG/ACT inhaler    Sig: Inhale 1-2 puffs into the lungs every 6 (six) hours as needed for wheezing or shortness of breath.    Dispense:  18 g    Refill:  99    Constitutional:   VSS, see nurse notes  General Appearance: alert, well-developed, well-nourished, NAD Eyes:  Normal lids and conjunctive, non-icteric sclera Neck:  No masses, trachea midline  No thyroid enlargement/tenderness/mass appreciated Respiratory:  Normal respiratory effort  Breath sounds normal, no wheeze/rhonchi/rales Cardiovascular:  S1/S2 normal, no murmur/rub/gallop auscultated  No lower extremity edema Gastrointestinal:  Nontender, no masses  No hepatomegaly, no splenomegaly  No hernia appreciated Musculoskeletal:   Gait normal  No clubbing/cyanosis of digits Neurological:  No cranial nerve deficit on limited exam  Motor and sensation intact and symmetric Psychiatric:  Normal  judgment/insight  Normal mood and affect GYN: No lesions/ulcers to external genitalia, normal urethra, normal vaginal mucosa, physiologic discharge, cervix normal without lesions, uterus not enlarged or tender, adnexa no masses and nontender BREAST: No rashes/skin changes, normal fibrous breast tissue, no masses or tenderness, normal nipple without discharge, normal axilla    Follow-up instructions: Return in about 1 year (around 07/25/2020) for ANNUAL (call week prior to visit for lab orders).                                         BP 104/68 (BP Location: Left Arm, Patient Position: Sitting)    Pulse 60    Temp 98 F (36.7 C)    Wt 232 lb (105.2 kg)    SpO2 100%    BMI 41.10 kg/m   Current Meds  Medication Sig   albuterol (VENTOLIN HFA) 108 (90 Base) MCG/ACT inhaler Inhale 1-2 puffs into the lungs every 6 (six) hours as needed for wheezing or shortness of breath.   fexofenadine (ALLEGRA) 180 MG tablet Take 1 tablet (180 mg total) by mouth daily.   sertraline (ZOLOFT) 50 MG tablet Take 1 tablet (50 mg total) by mouth at bedtime.   [DISCONTINUED] albuterol (PROVENTIL HFA;VENTOLIN HFA) 108 (90 BASE) MCG/ACT inhaler Inhale into the lungs every 6 (six) hours as needed for wheezing or shortness of breath.   [DISCONTINUED] fexofenadine (ALLEGRA) 180 MG tablet Take 180 mg by mouth daily.   [DISCONTINUED] sertraline (ZOLOFT) 50 MG tablet Take 1 tablet (50 mg total) by mouth at bedtime.    No results found for this or any previous visit (from the past 72 hour(s)).  No results found.     All questions at time of visit were answered - patient instructed to contact office with any additional concerns or updates.  ER/RTC precautions were reviewed with the patient as applicable.   Please note: voice recognition software was used to produce this document, and typos may escape review. Please contact Dr. Lyn Hollingshead for any needed clarifications.

## 2019-07-28 LAB — CYTOLOGY - PAP
Comment: NEGATIVE
Diagnosis: NEGATIVE
High risk HPV: NEGATIVE

## 2019-08-08 ENCOUNTER — Telehealth: Payer: No Typology Code available for payment source | Admitting: Nurse Practitioner

## 2019-08-08 DIAGNOSIS — B373 Candidiasis of vulva and vagina: Secondary | ICD-10-CM

## 2019-08-08 DIAGNOSIS — B3731 Acute candidiasis of vulva and vagina: Secondary | ICD-10-CM

## 2019-08-08 MED ORDER — FLUCONAZOLE 150 MG PO TABS
150.0000 mg | ORAL_TABLET | Freq: Once | ORAL | 0 refills | Status: AC
Start: 2019-08-08 — End: 2019-08-08

## 2019-08-08 MED FILL — FLUCONAZOLE 150 MG TABS: 150 | 1 days supply | Qty: 1 | Fill #0

## 2019-08-08 NOTE — Progress Notes (Signed)

## 2019-08-09 ENCOUNTER — Ambulatory Visit (INDEPENDENT_AMBULATORY_CARE_PROVIDER_SITE_OTHER): Payer: No Typology Code available for payment source | Admitting: Otolaryngology

## 2019-08-09 ENCOUNTER — Other Ambulatory Visit: Payer: Self-pay

## 2019-08-09 ENCOUNTER — Encounter (INDEPENDENT_AMBULATORY_CARE_PROVIDER_SITE_OTHER): Payer: Self-pay | Admitting: Otolaryngology

## 2019-08-09 VITALS — Temp 97.9°F

## 2019-08-09 DIAGNOSIS — J310 Chronic rhinitis: Secondary | ICD-10-CM

## 2019-08-09 MED FILL — AZELASTINE HCL 137 MCG SPRY: 0.1 | 50 days supply | Qty: 30 | Fill #0

## 2019-08-09 NOTE — Progress Notes (Signed)
HPI: Deanna Gross is a 34 y.o. female who presents is referred by Dr. Lyn Hollingshead for evaluation of chronic sinus complaints.  She states that she has had a history of chronic sinus infections.  She also gets headaches and pressure in her sinuses more on the right side than the left.  She has been told previously that she had polyps in her nose or polyps in the sinuses but on review of a CT scan of her sinuses performed 4 years ago demonstrated clear paranasal sinuses except for some small retention cyst within the maxillary sinuses.  She has more congestion on the right side than the left.  She has used Flonase in the past but is presently not using Flonase.  She occasionally uses azelastine which helps when she gets a lot of congestion and pressure.  The azelastine works rather quick quickly.  Sometimes pressure on the right side of her face goes into her eye in her teeth. Presently she is having no yellow-green discharge from her nose. She uses Allegra on a regular basis and has done this for years.  Past Medical History:  Diagnosis Date  . Anxiety   . Asthma    exercise-induced; prn inhaler  . Depression    on zoloft, doing well, cut dose.  Marland Kitchen Headache(784.0)    sinus  . History of MRSA infection 2008   left leg  . HPV in female   . Infection    UTI  . Insect bites 08/30/2013  . Seasonal allergies   . Surgical wound, non healing 08/2013   left breast  . Vaginal Pap smear, abnormal    Past Surgical History:  Procedure Laterality Date  . BREAST CYST EXCISION Left 03/18/2013   Procedure: EXCISE CYST LEFT MEDIAL BREAST;  Surgeon: Velora Heckler, MD;  Location: [facility];  Service: General;  Laterality: Left;  Marland Kitchen MASS EXCISION Left 09/01/2013   Procedure: RE-EXCISION NON HEALING WOUND LEFT BREAST;  Surgeon: Velora Heckler, MD;  Location: [facility];  Service: General;  Laterality: Left;  . TONSILLECTOMY    . TONSILLECTOMY AND ADENOIDECTOMY    . TYMPANOSTOMY TUBE  PLACEMENT     x 3  . WISDOM TOOTH EXTRACTION     Social History   Socioeconomic History  . Marital status: Married    Spouse name: Not on file  . Number of children: Not on file  . Years of education: Not on file  . Highest education level: Not on file  Occupational History  . Not on file  Tobacco Use  . Smoking status: Never Smoker  . Smokeless tobacco: Never Used  Vaping Use  . Vaping Use: Never used  Substance and Sexual Activity  . Alcohol use: Not Currently    Comment: rarely  . Drug use: No  . Sexual activity: Yes    Birth control/protection: None  Other Topics Concern  . Not on file  Social History Narrative  . Not on file   Social Determinants of Health   Financial Resource Strain:   . Difficulty of Paying Living Expenses:   Food Insecurity:   . Worried About Programme researcher, broadcasting/film/video in the Last Year:   . Barista in the Last Year:   Transportation Needs:   . Freight forwarder (Medical):   Marland Kitchen Lack of Transportation (Non-Medical):   Physical Activity:   . Days of Exercise per Week:   . Minutes of Exercise per Session:   Stress:   .  Feeling of Stress :   Social Connections:   . Frequency of Communication with Friends and Family:   . Frequency of Social Gatherings with Friends and Family:   . Attends Religious Services:   . Active Member of Clubs or Organizations:   . Attends Banker Meetings:   Marland Kitchen Marital Status:    Family History  Problem Relation Age of Onset  . Hyperlipidemia Father   . Hypertension Father   . Depression Father   . Hypothyroidism Mother   . Thyroid disease Mother   . Cancer Maternal Grandfather        leukemia  . Cancer Paternal Grandfather        non-hodgkins lymphoma  . Stroke Maternal Grandmother    Allergies  Allergen Reactions  . Polytrim [Polymyxin B-Trimethoprim] Other (See Comments)    OPHTHALMIC - FACIAL/EYE SWELLING   Prior to Admission medications   Medication Sig Start Date End Date Taking?  Authorizing Provider  albuterol (VENTOLIN HFA) 108 (90 Base) MCG/ACT inhaler Inhale 1-2 puffs into the lungs every 6 (six) hours as needed for wheezing or shortness of breath. 07/26/19  Yes Sunnie Nielsen, DO  fexofenadine (ALLEGRA) 180 MG tablet Take 1 tablet (180 mg total) by mouth daily. 07/26/19  Yes Sunnie Nielsen, DO  sertraline (ZOLOFT) 50 MG tablet Take 1 tablet (50 mg total) by mouth at bedtime. 07/26/19  Yes Sunnie Nielsen, DO     Positive ROS: Otherwise negative  All other systems have been reviewed and were otherwise negative with the exception of those mentioned in the HPI and as above.  Physical Exam: Constitutional: Alert, well-appearing, no acute distress Ears: External ears without lesions or tenderness. Ear canals are clear bilaterally with intact, clear TMs bilaterally. Nasal: External nose without lesions. Septum slightly deviated to the right with mild rhinitis and clear mucus discharge.  Both middle meatus regions were clear with no acute purulent discharge no polyps noted..  Oral: Lips and gums without lesions. Tongue and palate mucosa without lesions. Posterior oropharynx clear. Neck: No palpable adenopathy or masses Respiratory: Breathing comfortably  Skin: No facial/neck lesions or rash noted.  Procedures  Assessment: Chronic rhinitis. Questionable sinus disease.  Plan: Recommended initially regular use of Nasacort 2 sprays each nostril at night and use of azelastine 1 spray twice daily as needed allergies. She will continue with the Allegra. Also reviewed with her concerning using saline rinses. We will go ahead and schedule a repeat CT scan of the sinuses and have her follow-up following the repeat CT scan to review this and options of therapy.   Narda Bonds, MD   CC:

## 2019-08-11 ENCOUNTER — Other Ambulatory Visit (INDEPENDENT_AMBULATORY_CARE_PROVIDER_SITE_OTHER): Payer: Self-pay

## 2019-08-11 DIAGNOSIS — J329 Chronic sinusitis, unspecified: Secondary | ICD-10-CM

## 2019-08-22 ENCOUNTER — Encounter: Payer: Self-pay | Admitting: Osteopathic Medicine

## 2019-08-23 ENCOUNTER — Ambulatory Visit
Admission: RE | Admit: 2019-08-23 | Discharge: 2019-08-23 | Disposition: A | Discharge status: Home / Self Care | Payer: No Typology Code available for payment source | Source: Ambulatory Visit | Attending: Otolaryngology | Admitting: Otolaryngology

## 2019-08-23 DIAGNOSIS — J329 Chronic sinusitis, unspecified: Secondary | ICD-10-CM

## 2019-08-30 MED FILL — SERTRALINE HCL 50 MG TABS: 50 | 90 days supply | Qty: 90 | Fill #0

## 2019-09-06 ENCOUNTER — Ambulatory Visit (INDEPENDENT_AMBULATORY_CARE_PROVIDER_SITE_OTHER): Payer: No Typology Code available for payment source | Admitting: Otolaryngology

## 2019-09-06 ENCOUNTER — Other Ambulatory Visit: Payer: Self-pay

## 2019-09-06 ENCOUNTER — Encounter (INDEPENDENT_AMBULATORY_CARE_PROVIDER_SITE_OTHER): Payer: Self-pay | Admitting: Otolaryngology

## 2019-09-06 VITALS — Temp 97.3°F

## 2019-09-06 DIAGNOSIS — J310 Chronic rhinitis: Secondary | ICD-10-CM | POA: Diagnosis not present

## 2019-09-06 NOTE — Progress Notes (Signed)
HPI: Deanna Gross is a 34 y.o. female who returns today for evaluation of recent CT scan of her sinuses.  She has been using Nasacort since she was seen here last.  She stated that her last headache was this past Saturday.  She describes pressure that centers around her right cheek region and between her eyes.  She notices pressure in her sinuses when the weather changes.  She is also had history of tension headaches in her neck and back of her head. On review of the CT scan of her sinuses this showed clear paranasal sinuses with patent OMCs regions bilaterally.  Minimal mucoperiosteal thickening.  No air-fluid levels or opacification of the sinuses.  She did have a small retention cyst in the right maxillary sinus and the roof that was nonobstructing measuring approximately a centimeter in size.Marland Kitchen  Past Medical History:  Diagnosis Date  . Anxiety   . Asthma    exercise-induced; prn inhaler  . Depression    on zoloft, doing well, cut dose.  Marland Kitchen Headache(784.0)    sinus  . History of MRSA infection 2008   left leg  . HPV in female   . Infection    UTI  . Insect bites 08/30/2013  . Seasonal allergies   . Surgical wound, non healing 08/2013   left breast  . Vaginal Pap smear, abnormal    Past Surgical History:  Procedure Laterality Date  . BREAST CYST EXCISION Left 03/18/2013   Procedure: EXCISE CYST LEFT MEDIAL BREAST;  Surgeon: Velora Heckler, MD;  Location: [facility];  Service: General;  Laterality: Left;  Marland Kitchen MASS EXCISION Left 09/01/2013   Procedure: RE-EXCISION NON HEALING WOUND LEFT BREAST;  Surgeon: Velora Heckler, MD;  Location: [facility];  Service: General;  Laterality: Left;  . TONSILLECTOMY    . TONSILLECTOMY AND ADENOIDECTOMY    . TYMPANOSTOMY TUBE PLACEMENT     x 3  . WISDOM TOOTH EXTRACTION     Social History   Socioeconomic History  . Marital status: Married    Spouse name: Not on file  . Number of children: Not on file  . Years of  education: Not on file  . Highest education level: Not on file  Occupational History  . Not on file  Tobacco Use  . Smoking status: Never Smoker  . Smokeless tobacco: Never Used  Vaping Use  . Vaping Use: Never used  Substance and Sexual Activity  . Alcohol use: Not Currently    Comment: rarely  . Drug use: No  . Sexual activity: Yes    Birth control/protection: None  Other Topics Concern  . Not on file  Social History Narrative  . Not on file   Social Determinants of Health   Financial Resource Strain:   . Difficulty of Paying Living Expenses:   Food Insecurity:   . Worried About Programme researcher, broadcasting/film/video in the Last Year:   . Barista in the Last Year:   Transportation Needs:   . Freight forwarder (Medical):   Marland Kitchen Lack of Transportation (Non-Medical):   Physical Activity:   . Days of Exercise per Week:   . Minutes of Exercise per Session:   Stress:   . Feeling of Stress :   Social Connections:   . Frequency of Communication with Friends and Family:   . Frequency of Social Gatherings with Friends and Family:   . Attends Religious Services:   . Active Member of  Clubs or Organizations:   . Attends Banker Meetings:   Marland Kitchen Marital Status:    Family History  Problem Relation Age of Onset  . Hyperlipidemia Father   . Hypertension Father   . Depression Father   . Hypothyroidism Mother   . Thyroid disease Mother   . Cancer Maternal Grandfather        leukemia  . Cancer Paternal Grandfather        non-hodgkins lymphoma  . Stroke Maternal Grandmother    Allergies  Allergen Reactions  . Polytrim [Polymyxin B-Trimethoprim] Other (See Comments)    OPHTHALMIC - FACIAL/EYE SWELLING   Prior to Admission medications   Medication Sig Start Date End Date Taking? Authorizing Provider  albuterol (VENTOLIN HFA) 108 (90 Base) MCG/ACT inhaler Inhale 1-2 puffs into the lungs every 6 (six) hours as needed for wheezing or shortness of breath. 07/26/19   Sunnie Nielsen, DO  fexofenadine (ALLEGRA) 180 MG tablet Take 1 tablet (180 mg total) by mouth daily. 07/26/19   Sunnie Nielsen, DO  sertraline (ZOLOFT) 50 MG tablet Take 1 tablet (50 mg total) by mouth at bedtime. 07/26/19   Sunnie Nielsen, DO     Positive ROS: Otherwise negative  All other systems have been reviewed and were otherwise negative with the exception of those mentioned in the HPI and as above.  Physical Exam: Constitutional: Alert, well-appearing, no acute distress Ears: External ears without lesions or tenderness. Ear canals are clear bilaterally with intact, clear TMs.  Nasal: External nose without lesions. Septum relatively midline with mild rhinitis.  Both middle meatus regions were clear.. Clear nasal passages otherwise. Oral: Lips and gums without lesions. Tongue and palate mucosa without lesions. Posterior oropharynx clear. Neck: No palpable adenopathy or masses Respiratory: Breathing comfortably  Skin: No facial/neck lesions or rash noted.  Procedures  Assessment: Chronic rhinitis. No significant sinus disease noted on CT scan.  Plan: Discussed with patient concerning regular use of nasal steroid spray as this may help some with the pressure within the sinuses.  Also discussed with her concerning trial use of Afrin when she is having the pressure to see if this helps relieve the pressure. Reviewed the CT scan with the patient in the office today. She will follow-up as needed if the symptoms worsen.   Narda Bonds, MD

## 2019-09-12 ENCOUNTER — Telehealth: Payer: No Typology Code available for payment source | Admitting: Emergency Medicine

## 2019-09-12 DIAGNOSIS — N898 Other specified noninflammatory disorders of vagina: Secondary | ICD-10-CM

## 2019-09-12 MED ORDER — FLUCONAZOLE 150 MG PO TABS
150.0000 mg | ORAL_TABLET | Freq: Every day | ORAL | 0 refills | Status: AC
Start: 2019-09-12 — End: 2019-09-14

## 2019-09-12 MED FILL — FLUCONAZOLE 150 MG TABS: 150 | 2 days supply | Qty: 2 | Fill #0

## 2019-09-12 NOTE — Progress Notes (Signed)
Time spent: 10 min  We are sorry that you are not feeling well. Here is how we plan to help!  Based on what you shared with me it looks like you: May have a yeast vaginosis  Vaginosis is an inflammation of the vagina that can result in discharge, itching and pain. The cause is usually a change in the normal balance of vaginal bacteria or an infection. Vaginosis can also result from reduced estrogen levels after menopause.  The most common causes of vaginosis are:   Bacterial vaginosis which results from an overgrowth of one on several organisms that are normally present in your vagina.   Yeast infections which are caused by a naturally occurring fungus called candida.   Vaginal atrophy (atrophic vaginosis) which results from the thinning of the vagina from reduced estrogen levels after menopause.   Trichomoniasis which is caused by a parasite and is commonly transmitted by sexual intercourse.  Factors that increase your risk of developing vaginosis include: Marland Kitchen Medications, such as antibiotics and steroids . Uncontrolled diabetes . Use of hygiene products such as bubble bath, vaginal spray or vaginal deodorant . Douching . Wearing damp or tight-fitting clothing . Using an intrauterine device (IUD) for birth control . Hormonal changes, such as those associated with pregnancy, birth control pills or menopause . Sexual activity . Having a sexually transmitted infection  Your treatment plan is A single Diflucan (fluconazole) 150mg  tablet once.  I have electronically sent this prescription into the pharmacy that you have chosen.  Sometimes a repeat (second) dose 72 hours later can help for more persistent symptoms.  I have sent a prescription for two 150 mg diflucan tablets to the pharmacy.  Take the first table today and repeat a second dose 72 hours later if symptoms have not started to improve or resolved.    Be sure to take all of the medication as directed. Stop taking any medication if  you develop a rash, tongue swelling or shortness of breath. Mothers who are breast feeding should consider pumping and discarding their breast milk while on these antibiotics. However, there is no consensus that infant exposure at these doses would be harmful.  Remember that medication creams can weaken latex condoms.   HOME CARE:  Good hygiene may prevent some types of vaginosis from recurring and may relieve some symptoms:  . Avoid baths, hot tubs and whirlpool spas. Rinse soap from your outer genital area after a shower, and dry the area well to prevent irritation. Don't use scented or harsh soaps, such as those with deodorant or antibacterial action. Marland Kitchen Avoid irritants. These include scented tampons and pads. . Wipe from front to back after using the toilet. Doing so avoids spreading fecal bacteria to your vagina.  Other things that may help prevent vaginosis include:  Marland Kitchen Don't douche. Your vagina doesn't require cleansing other than normal bathing. Repetitive douching disrupts the normal organisms that reside in the vagina and can actually increase your risk of vaginal infection. Douching won't clear up a vaginal infection. . Use a latex condom. Both female and female latex condoms may help you avoid infections spread by sexual contact. . Wear cotton underwear. Also wear pantyhose with a cotton crotch. If you feel comfortable without it, skip wearing underwear to bed. Yeast thrives in Marland Kitchen Your symptoms should improve in the next day or two.  GET HELP RIGHT AWAY IF:  . You have pain in your lower abdomen ( pelvic area or over your ovaries) .  You develop nausea or vomiting . You develop a fever . Your discharge changes or worsens . You have persistent pain with intercourse . You develop shortness of breath, a rapid pulse, or you faint.  These symptoms could be signs of problems or infections that need to be evaluated by a medical provider now.  MAKE SURE YOU     Understand these instructions.  Will watch your condition.  Will get help right away if you are not doing well or get worse.  Your e-visit answers were reviewed by a board certified advanced clinical practitioner to complete your personal care plan. Depending upon the condition, your plan could have included both over the counter or prescription medications. Please review your pharmacy choice to make sure that you have choses a pharmacy that is open for you to pick up any needed prescription, Your safety is important to [place]. If you have drug allergies check your prescription carefully.   You can use MyChart to ask questions about today's visit, request a non-urgent call back, or ask for a work or school excuse for 24 hours related to this e-Visit. If it has been greater than 24 hours you will need to follow up with your provider, or enter a new e-Visit to address those concerns. You will get a MyChart message within the next two days asking about your experience. I hope that your e-visit has been valuable and will speed your recovery.  I hope you feel better soon,   Sharen Heck, PA-C [facility]

## 2019-11-17 ENCOUNTER — Other Ambulatory Visit: Payer: Self-pay | Admitting: Physician Assistant

## 2019-11-17 ENCOUNTER — Telehealth: Payer: No Typology Code available for payment source | Admitting: Physician Assistant

## 2019-11-17 DIAGNOSIS — N898 Other specified noninflammatory disorders of vagina: Secondary | ICD-10-CM | POA: Diagnosis not present

## 2019-11-17 MED ORDER — FLUCONAZOLE 150 MG PO TABS
150.0000 mg | ORAL_TABLET | Freq: Once | ORAL | 0 refills | Status: DC
Start: 2019-11-17 — End: 2019-11-17

## 2019-11-17 MED FILL — FLUCONAZOLE 150 MG TABS: 150 | 1 days supply | Qty: 1 | Fill #0

## 2019-11-17 NOTE — Progress Notes (Signed)
We are sorry that you are not feeling well. Here is how we plan to help! Based on what you shared with me it looks like you: May have a yeast vaginosis  Vaginosis is an inflammation of the vagina that can result in discharge, itching and pain. The cause is usually a change in the normal balance of vaginal bacteria or an infection. Vaginosis can also result from reduced estrogen levels after menopause.  The most common causes of vaginosis are:   Bacterial vaginosis which results from an overgrowth of one on several organisms that are normally present in your vagina.   Yeast infections which are caused by a naturally occurring fungus called candida.   Vaginal atrophy (atrophic vaginosis) which results from the thinning of the vagina from reduced estrogen levels after menopause.   Trichomoniasis which is caused by a parasite and is commonly transmitted by sexual intercourse.  Factors that increase your risk of developing vaginosis include: . Medications, such as antibiotics and steroids . Uncontrolled diabetes . Use of hygiene products such as bubble bath, vaginal spray or vaginal deodorant . Douching . Wearing damp or tight-fitting clothing . Using an intrauterine device (IUD) for birth control . Hormonal changes, such as those associated with pregnancy, birth control pills or menopause . Sexual activity . Having a sexually transmitted infection  Your treatment plan is A single Diflucan (fluconazole) 150mg tablet once.  I have electronically sent this prescription into the pharmacy that you have chosen.  Be sure to take all of the medication as directed. Stop taking any medication if you develop a rash, tongue swelling or shortness of breath. Mothers who are breast feeding should consider pumping and discarding their breast milk while on these antibiotics. However, there is no consensus that infant exposure at these doses would be harmful.  Remember that medication creams can weaken latex  condoms. .   HOME CARE:  Good hygiene may prevent some types of vaginosis from recurring and may relieve some symptoms:  . Avoid baths, hot tubs and whirlpool spas. Rinse soap from your outer genital area after a shower, and dry the area well to prevent irritation. Don't use scented or harsh soaps, such as those with deodorant or antibacterial action. . Avoid irritants. These include scented tampons and pads. . Wipe from front to back after using the toilet. Doing so avoids spreading fecal bacteria to your vagina.  Other things that may help prevent vaginosis include:  . Don't douche. Your vagina doesn't require cleansing other than normal bathing. Repetitive douching disrupts the normal organisms that reside in the vagina and can actually increase your risk of vaginal infection. Douching won't clear up a vaginal infection. . Use a latex condom. Both female and female latex condoms may help you avoid infections spread by sexual contact. . Wear cotton underwear. Also wear pantyhose with a cotton crotch. If you feel comfortable without it, skip wearing underwear to bed. Yeast thrives in moist environments Your symptoms should improve in the next day or two.  GET HELP RIGHT AWAY IF:  . You have pain in your lower abdomen ( pelvic area or over your ovaries) . You develop nausea or vomiting . You develop a fever . Your discharge changes or worsens . You have persistent pain with intercourse . You develop shortness of breath, a rapid pulse, or you faint.  These symptoms could be signs of problems or infections that need to be evaluated by a medical provider now.  MAKE SURE YOU      Understand these instructions.  Will watch your condition.  Will get help right away if you are not doing well or get worse.  Your e-visit answers were reviewed by a board certified advanced clinical practitioner to complete your personal care plan. Depending upon the condition, your plan could have included  both over the counter or prescription medications. Please review your pharmacy choice to make sure that you have choses a pharmacy that is open for you to pick up any needed prescription, Your safety is important to [place]. If you have drug allergies check your prescription carefully.   You can use MyChart to ask questions about today's visit, request a non-urgent call back, or ask for a work or school excuse for 24 hours related to this e-Visit. If it has been greater than 24 hours you will need to follow up with your provider, or enter a new e-Visit to address those concerns. You will get a MyChart message within the next two days asking about your experience. I hope that your e-visit has been valuable and will speed your recovery.  Greater than 5 minutes, yet less than 10 minutes of time have been spent researching, coordinating, and implementing care for this patient today.  Jarold Motto PA-C

## 2019-11-30 MED FILL — SERTRALINE HCL 50 MG TABLET: 50 | 90 days supply | Qty: 90 | Fill #1

## 2019-12-30 ENCOUNTER — Encounter: Payer: Self-pay | Admitting: Osteopathic Medicine

## 2020-01-02 ENCOUNTER — Other Ambulatory Visit: Payer: Self-pay | Admitting: Osteopathic Medicine

## 2020-01-02 MED ORDER — LORAZEPAM 1 MG PO TABS: 0.5000 mg | ORAL_TABLET | Freq: Two times a day (BID) | ORAL | 0 refills | Status: DC | PRN

## 2020-01-02 MED FILL — LORazepam 1 MG TABS: 1 | 2 days supply | Qty: 5 | Fill #0

## 2020-01-26 ENCOUNTER — Inpatient Hospital Stay: Payer: No Typology Code available for payment source | Attending: Oncology

## 2020-01-26 ENCOUNTER — Other Ambulatory Visit: Payer: Self-pay

## 2020-01-26 DIAGNOSIS — Z23 Encounter for immunization: Secondary | ICD-10-CM | POA: Diagnosis present

## 2020-01-26 NOTE — Progress Notes (Signed)
   Covid-19 Vaccination Clinic  Name:  Deanna Gross    MRN: [id number] DOB: August 08, 1985  01/26/2020  Deanna Gross was observed post Covid-19 immunization for 15 minutes without incident. She was provided with Vaccine Information Sheet and instruction to access the V-Safe system.   Deanna Gross was instructed to call 911 with any severe reactions post vaccine: Marland Kitchen Difficulty breathing  . Swelling of face and throat  . A fast heartbeat  . A bad rash all over body  . Dizziness and weakness   Immunizations Administered    Name Date Dose VIS Date Route   Pfizer COVID-19 Vaccine 01/26/2020  1:40 PM 0.3 mL 11/16/2019 Intramuscular   Manufacturer: [employer], [employer]   Lot: [id number]   NDC: [address]-1

## 2020-02-27 MED FILL — SERTRALINE HCL 50 MG TABS: 50 | 90 days supply | Qty: 90 | Fill #2

## 2020-04-17 ENCOUNTER — Other Ambulatory Visit: Payer: Self-pay | Admitting: Physician Assistant

## 2020-04-17 ENCOUNTER — Telehealth: Payer: No Typology Code available for payment source | Admitting: Physician Assistant

## 2020-04-17 DIAGNOSIS — J0110 Acute frontal sinusitis, unspecified: Secondary | ICD-10-CM | POA: Diagnosis not present

## 2020-04-17 DIAGNOSIS — R591 Generalized enlarged lymph nodes: Secondary | ICD-10-CM

## 2020-04-17 MED ORDER — AMOXICILLIN-POT CLAVULANATE 875-125 MG PO TABS: 1.0000 | ORAL_TABLET | Freq: Two times a day (BID) | ORAL | 0 refills | Status: DC

## 2020-04-17 MED FILL — AMOX TR-K CLV 875-125 MG TA: 875-125 | 7 days supply | Qty: 14 | Fill #0

## 2020-04-17 NOTE — Progress Notes (Signed)
I have spent 5 minutes in review of e-visit questionnaire, review and updating patient chart, medical decision making and response to patient.   Kellin Fifer Cody Erva Koke, PA-C    

## 2020-04-17 NOTE — Progress Notes (Signed)
We are sorry that you are not feeling well.  Here is how we plan to help!  Based on what you have shared with me it looks like you have sinusitis.  Sinusitis is inflammation and infection in the sinus cavities of the head.  Based on what you have told me this likely started as a viral sinusitis but there seems to be lymph node involvement. Giving this and to cover our bases,  I have prescribed Augmentin 875mg /125mg  one tablet twice daily with food, for 7 days. You may use an oral decongestant such as Mucinex D or if you have glaucoma or high blood pressure use plain Mucinex. Saline nasal spray help and can safely be used as often as needed for congestion.  If not already doing so, start OTC Flonase to help with sinus and nasal inflammation and congestion. If you develop worsening sinus pain, fever or notice severe headache and vision changes, or if symptoms are not better after completion of antibiotic, please schedule an appointment with a health care provider.    Sinus infections are not as easily transmitted as other respiratory infection, however we still recommend that you avoid close contact with loved ones, especially the very young and elderly.  Remember to wash your hands thoroughly throughout the day as this is the number one way to prevent the spread of infection!  Home Care:  Only take medications as instructed by your medical team.  Complete the entire course of an antibiotic.  Do not take these medications with alcohol.  A steam or ultrasonic humidifier can help congestion.  You can place a towel over your head and breathe in the steam from hot water coming from a faucet.  Avoid close contacts especially the very young and the elderly.  Cover your mouth when you cough or sneeze.  Always remember to wash your hands.  Get Help Right Away If:  You develop worsening fever or sinus pain.  You develop a severe head ache or visual changes.  Your symptoms persist after you have  completed your treatment plan.  Make sure you  Understand these instructions.  Will watch your condition.  Will get help right away if you are not doing well or get worse.  Your e-visit answers were reviewed by a board certified advanced clinical practitioner to complete your personal care plan.  Depending on the condition, your plan could have included both over the counter or prescription medications.  If there is a problem please reply  once you have received a response from your provider.  Your safety is important to .  If you have drug allergies check your prescription carefully.    You can use MyChart to ask questions about today's visit, request a non-urgent call back, or ask for a work or school excuse for 24 hours related to this e-Visit. If it has been greater than 24 hours you will need to follow up with your provider, or enter a new e-Visit to address those concerns.  You will get an e-mail in the next two days asking about your experience.  I hope that your e-visit has been valuable and will speed your recovery. Thank you for using e-visits.

## 2020-04-17 NOTE — Progress Notes (Signed)
Message sent to patient requesting further input regarding current symptoms. Awaiting patient response.  

## 2020-04-20 ENCOUNTER — Other Ambulatory Visit (HOSPITAL_BASED_OUTPATIENT_CLINIC_OR_DEPARTMENT_OTHER): Payer: Self-pay

## 2020-04-23 ENCOUNTER — Other Ambulatory Visit: Payer: Self-pay | Admitting: Physician Assistant

## 2020-04-23 ENCOUNTER — Telehealth: Payer: No Typology Code available for payment source | Admitting: Physician Assistant

## 2020-04-23 DIAGNOSIS — B3731 Acute candidiasis of vulva and vagina: Secondary | ICD-10-CM

## 2020-04-23 DIAGNOSIS — B373 Candidiasis of vulva and vagina: Secondary | ICD-10-CM

## 2020-04-23 MED ORDER — FLUCONAZOLE 150 MG PO TABS: 150.0000 mg | ORAL_TABLET | Freq: Once | ORAL | 0 refills | Status: DC

## 2020-04-23 MED FILL — FLUCONAZOLE 150 MG TABS: 150 | 3 days supply | Qty: 2 | Fill #0

## 2020-04-23 NOTE — Progress Notes (Signed)
I have spent 5 minutes in review of e-visit questionnaire, review and updating patient chart, medical decision making and response to patient.   Klever Twyford Cody Jahkari Maclin, PA-C    

## 2020-04-23 NOTE — Progress Notes (Signed)

## 2020-05-24 IMAGING — US ULTRASOUND ABDOMEN LIMITED
1 series · 16 of 25 positions shown · non-contrast
Comparison: None.

CLINICAL DATA: Right upper quadrant pain. Thirty-one weeks
pregnant.

EXAM:
ULTRASOUND ABDOMEN LIMITED RIGHT UPPER QUADRANT

[Series 1: ultrasound abdomen limited · 33 acquisitions, 16 frames shown]
[im 1/33]
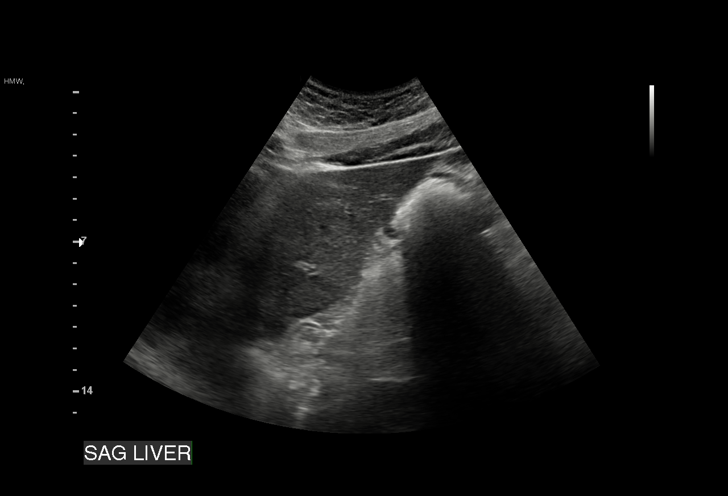
[im 3/33]
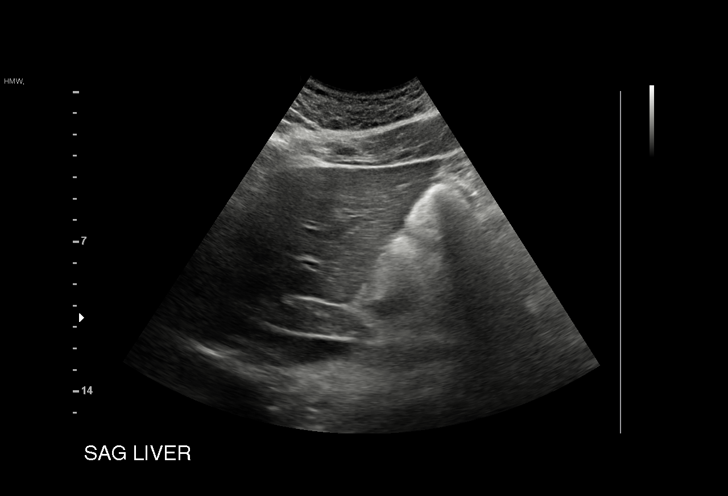
[im 5/33]
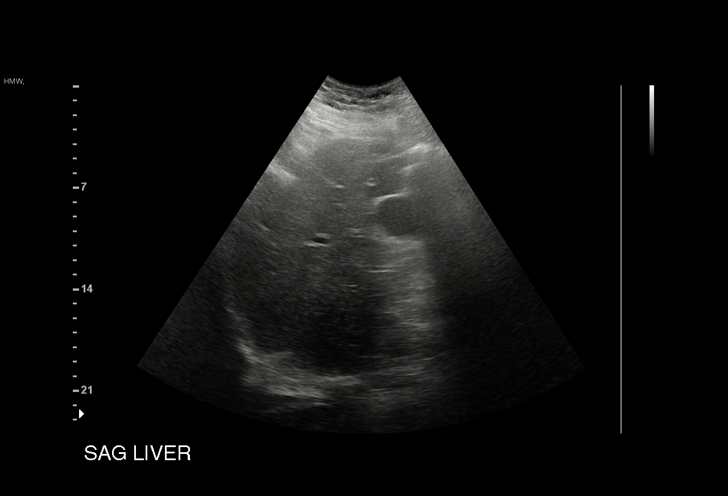
[im 7/33]
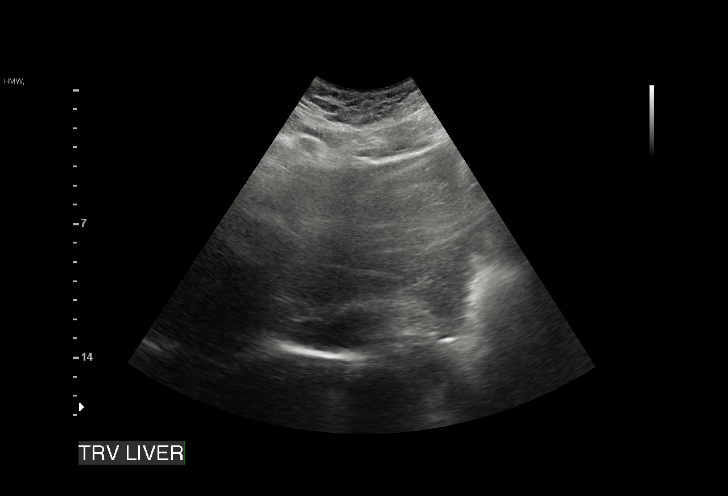
[im 10/33]
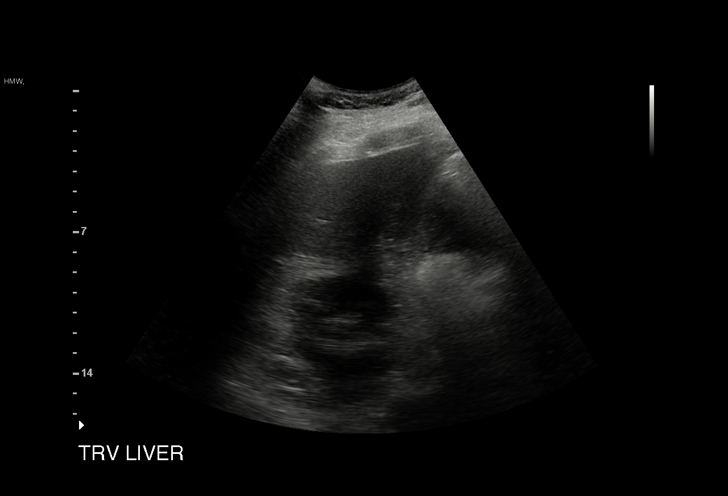
[im 11/33]
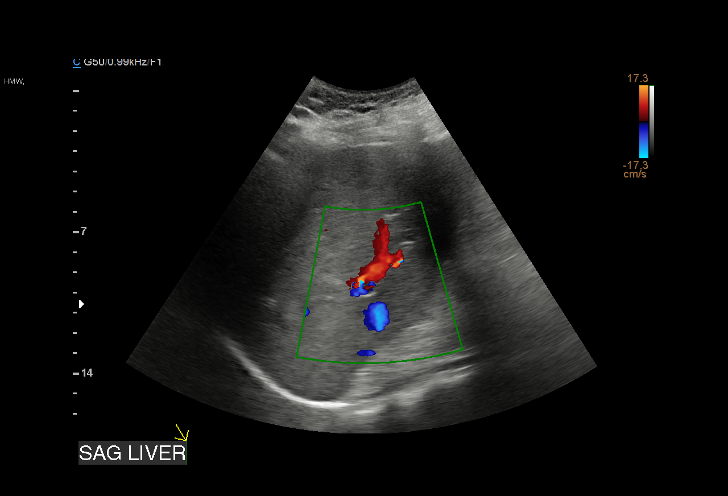
[im 14/33]
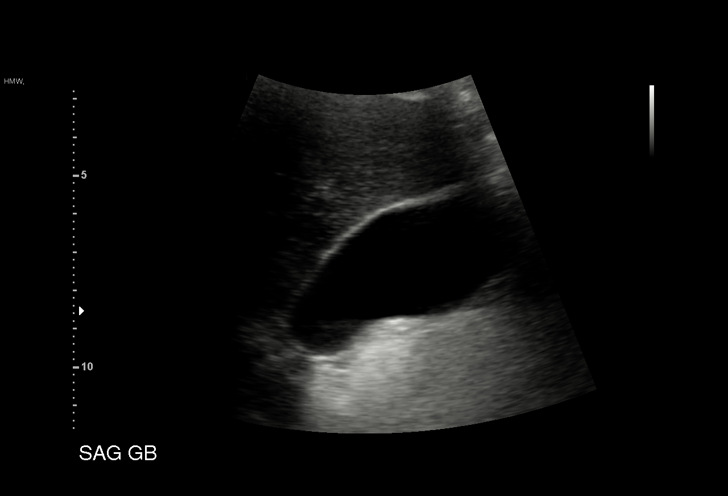
[im 15/33]
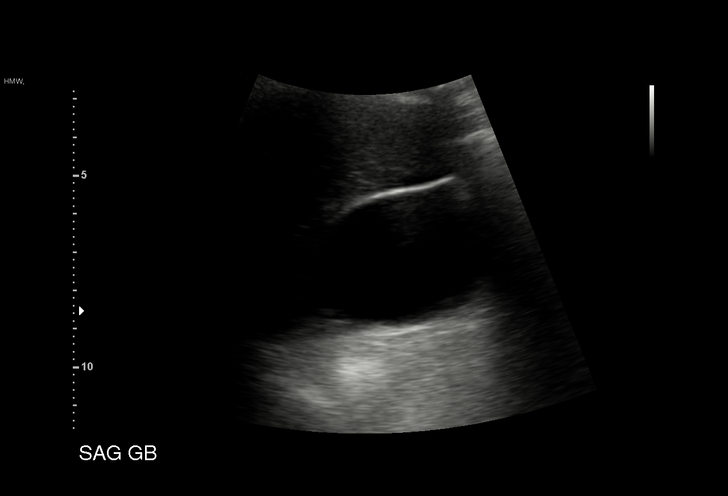
[im 18/33]
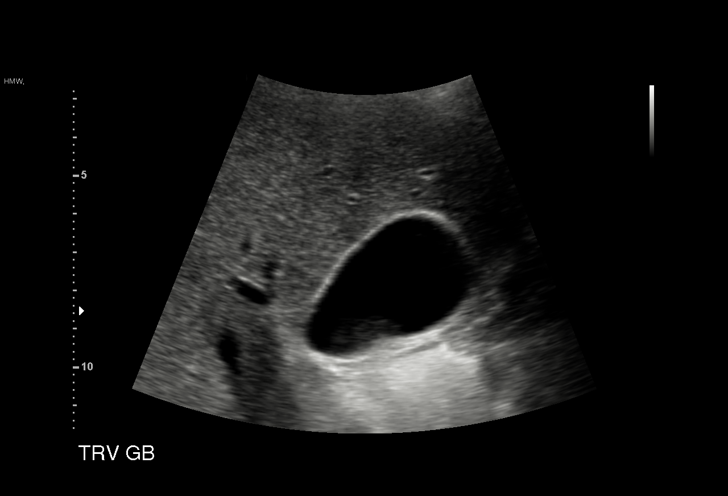
[im 19/33]
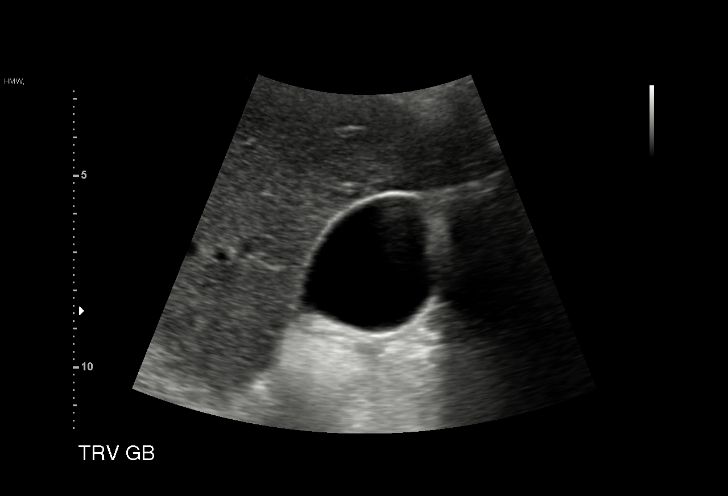
[im 22/33]
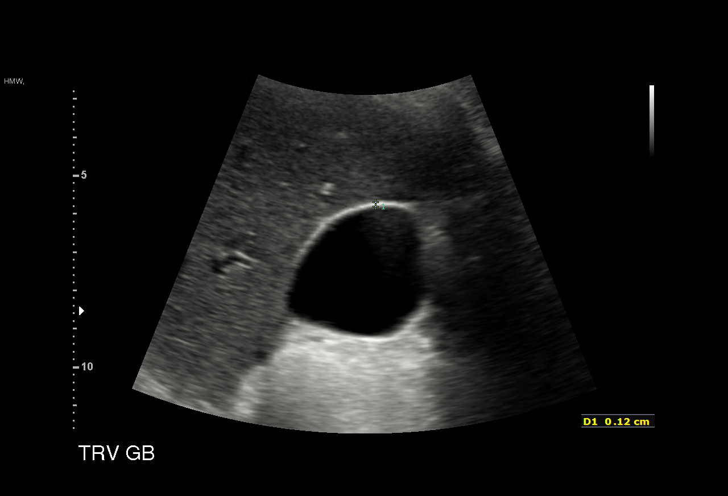
[im 23/33]
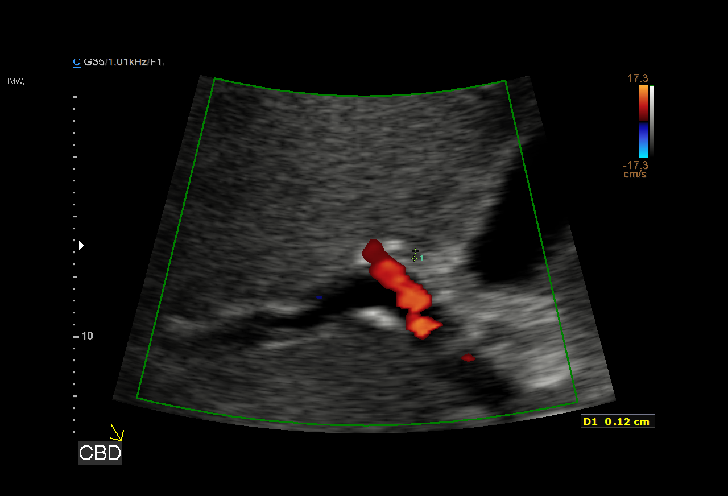
[im 26/33]
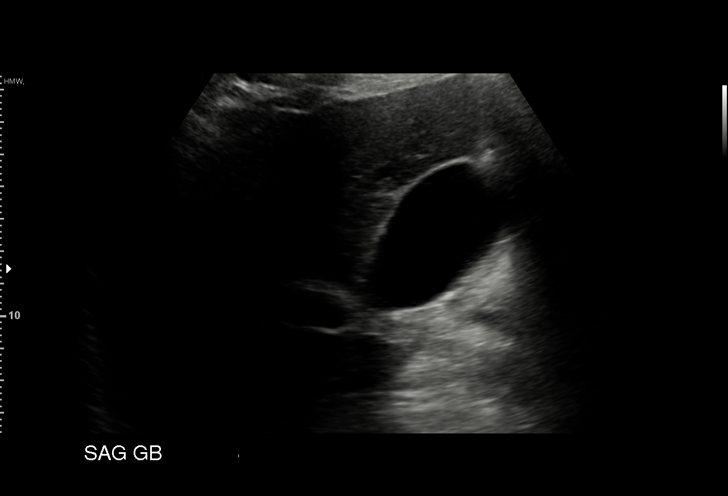
[im 29/33]
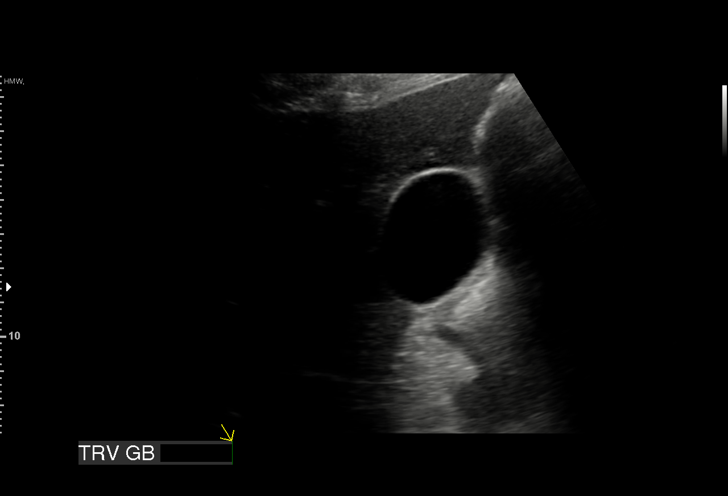
[im 30/33]
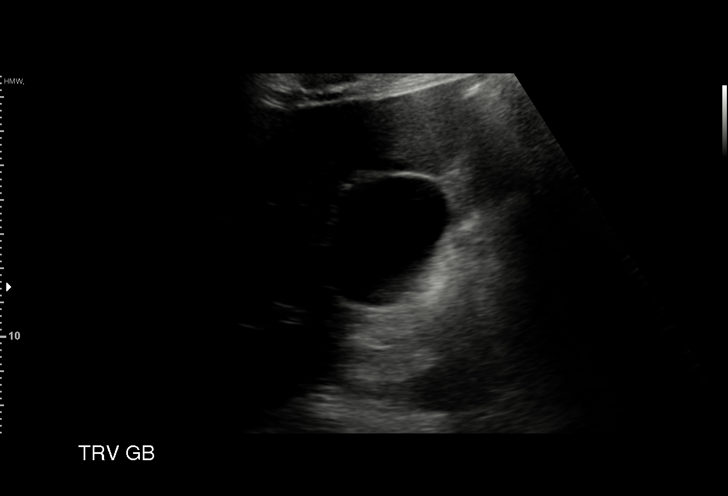
[im 33/33]
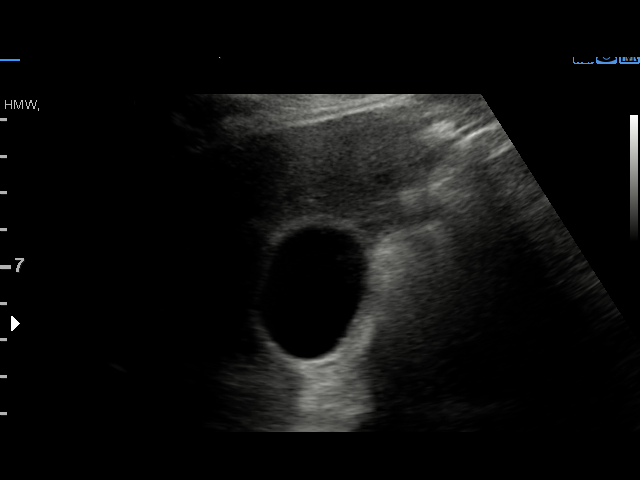

[16 of 25 positions shown; findings below may reference images not displayed]

FINDINGS: Gallbladder:

No gallstones or wall thickening visualized. No sonographic Murphy
sign noted by sonographer.

Common bile duct:

Diameter: Normal caliber, 5 mm.

Liver:

No focal lesion identified. Within normal limits in parenchymal
echogenicity. Portal vein is patent on color Doppler imaging with
normal direction of blood flow towards the liver.
IMPRESSION: Normal right upper quadrant ultrasound.

## 2020-05-29 ENCOUNTER — Other Ambulatory Visit (HOSPITAL_COMMUNITY): Payer: Self-pay

## 2020-05-30 ENCOUNTER — Other Ambulatory Visit (HOSPITAL_COMMUNITY): Payer: Self-pay

## 2020-05-30 MED FILL — Sertraline HCl Tab 50 MG: ORAL | 90 days supply | Qty: 90 | Fill #0 | Status: AC

## 2020-07-25 ENCOUNTER — Encounter: Payer: No Typology Code available for payment source | Admitting: Osteopathic Medicine

## 2020-07-25 ENCOUNTER — Ambulatory Visit (INDEPENDENT_AMBULATORY_CARE_PROVIDER_SITE_OTHER): Payer: No Typology Code available for payment source | Admitting: Osteopathic Medicine

## 2020-07-25 DIAGNOSIS — Z0000 Encounter for general adult medical examination without abnormal findings: Secondary | ICD-10-CM

## 2020-07-26 ENCOUNTER — Other Ambulatory Visit (HOSPITAL_COMMUNITY): Payer: Self-pay

## 2020-07-26 ENCOUNTER — Encounter: Payer: Self-pay | Admitting: Osteopathic Medicine

## 2020-07-26 ENCOUNTER — Telehealth (INDEPENDENT_AMBULATORY_CARE_PROVIDER_SITE_OTHER): Payer: No Typology Code available for payment source | Admitting: Osteopathic Medicine

## 2020-07-26 DIAGNOSIS — F411 Generalized anxiety disorder: Secondary | ICD-10-CM | POA: Diagnosis not present

## 2020-07-26 DIAGNOSIS — J310 Chronic rhinitis: Secondary | ICD-10-CM | POA: Diagnosis not present

## 2020-07-26 DIAGNOSIS — Z0000 Encounter for general adult medical examination without abnormal findings: Secondary | ICD-10-CM

## 2020-07-26 LAB — COMPLETE METABOLIC PANEL WITH GFR
AG Ratio: 1.8 (calc) (ref 1.0–2.5)
ALT: 15 U/L (ref 6–29)
AST: 18 U/L (ref 10–30)
Albumin: 4.2 g/dL (ref 3.6–5.1)
Alkaline phosphatase (APISO): 67 U/L (ref 31–125)
BUN: 12 mg/dL (ref 7–25)
CO2: 28 mmol/L (ref 20–32)
Calcium: 9.3 mg/dL (ref 8.6–10.2)
Chloride: 103 mmol/L (ref 98–110)
Creat: 0.75 mg/dL (ref 0.50–1.10)
GFR, Est African American: 119 mL/min/{1.73_m2} (ref >=60)
GFR, Est Non African American: 103 mL/min/{1.73_m2} (ref >=60)
Globulin: 2.4 g/dL (calc) (ref 1.9–3.7)
Glucose, Bld: 83 mg/dL (ref 65–99)
Potassium: 4.1 mmol/L (ref 3.5–5.3)
Sodium: 140 mmol/L (ref 135–146)
Total Bilirubin: 0.3 mg/dL (ref 0.2–1.2)
Total Protein: 6.6 g/dL (ref 6.1–8.1)

## 2020-07-26 LAB — LIPID PANEL
Cholesterol: 203 mg/dL — ABNORMAL HIGH (ref <200)
HDL: 83 mg/dL (ref >=50)
LDL Cholesterol (Calc): 96 mg/dL (calc)
Non-HDL Cholesterol (Calc): 120 mg/dL (calc) (ref <130)
Total CHOL/HDL Ratio: 2.4 (calc) (ref <5.0)
Triglycerides: 137 mg/dL (ref <150)

## 2020-07-26 LAB — CBC
HCT: 42.3 % (ref 35.0–45.0)
Hemoglobin: 14 g/dL (ref 11.7–15.5)
MCH: 29 pg (ref 27.0–33.0)
MCHC: 33.1 g/dL (ref 32.0–36.0)
MCV: 87.6 fL (ref 80.0–100.0)
MPV: 10.4 fL (ref 7.5–12.5)
Platelets: 274 10*3/uL (ref 140–400)
RBC: 4.83 10*6/uL (ref 3.80–5.10)
RDW: 12.8 % (ref 11.0–15.0)
WBC: 6.9 10*3/uL (ref 3.8–10.8)

## 2020-07-26 LAB — TSH: TSH: 1.19 mIU/L

## 2020-07-26 MED ORDER — FEXOFENADINE HCL 180 MG PO TABS
180.0000 mg | ORAL_TABLET | Freq: Every day | ORAL | 3 refills | Status: DC
  Filled 2020-07-26: qty 30, 30d supply, fill #0

## 2020-07-26 MED ORDER — SERTRALINE HCL 50 MG PO TABS
ORAL_TABLET | Freq: Every day | ORAL | 3 refills | Status: DC
  Filled 2020-07-26: qty 90, fill #0
  Filled 2020-08-29: qty 90, 90d supply, fill #0
  Filled 2020-11-30: qty 90, 90d supply, fill #1
  Filled 2021-02-28: qty 90, 90d supply, fill #2
  Filled 2021-05-04: qty 90, 90d supply, fill #3

## 2020-07-26 NOTE — Patient Instructions (Signed)
General Preventive Care Most recent routine screening labs: all looked good!.  Blood pressure goal 130/80 or less.  Tobacco: don't!  Alcohol: responsible moderation is ok for most adults - if you have concerns about your alcohol intake, please talk to me!  Exercise: as tolerated to reduce risk of cardiovascular disease and diabetes. Strength training will also prevent osteoporosis.  Mental health: if need for mental health care (medicines, counseling, other), or concerns about moods, please let me know!  Sexual / Reproductive health: if need for STI testing, or if concerns with libido/pain problems, please let me know! If you need to discuss family planning, please let me know!  Advanced Directive: Living Will and/or Healthcare Power of Attorney recommended for all adults, regardless of age or health.  Vaccines Flu vaccine: for almost everyone, every fall.  Shingles vaccine: after age 39.  Pneumonia vaccines: after age 71. Tetanus booster: every 10 years / 3rd trimester of pregnancy COVID vaccine: THANKS for getting your vaccine! :)  Cancer screenings  Colon cancer screening: for everyone age 17-75. Breast cancer screening: mammogram at age 101 Cervical cancer screening: Pap every 5 years if normal  Lung cancer screening: not needed for non-smokers Infection screenings  HIV: recommended screening at least once age 62-65, more often as needed. Gonorrhea/Chlamydia: screening as needed.  Hepatitis C: recommended once for everyone age 65-75 TB: certain at-risk populations, or depending on work requirements and/or travel history Other Bone Density Test: recommended for women at age 31 Abdominal Aortic Aneurysm: screening with ultrasound recommended once for men age 43-75 who have ever smoked

## 2020-07-26 NOTE — Progress Notes (Signed)
Telemedicine Visit via  Video & Audio (App used: MyChart)  I connected with Deanna Gross on 07/26/20 at 7:49 AM  by phone or  telemedicine application as noted above  I verified that I am speaking with or regarding  the correct patient using two identifiers.  Participants: Myself, Dr Sunnie Nielsen DO Patient: Deanna Gross Patient proxy if applicable: none Other, if applicable: none  Patient is at home I am in office at [facility]    I discussed the limitations of evaluation and management  by telemedicine and the availability of in person appointments.  The participant(s) above expressed understanding and  agreed to proceed with this appointment via telemedicine.       History of Present Illness: Deanna Gross is a 35 y.o. female who would like to discuss annual physical / preventive care    Overall doing really well Seeing ENT for some sinus congestion issues  Mental health doing well on Zoloft  Will be trying for second pregnancy later this year   Observations/Objective: There were no vitals taken for this visit. BP Readings from Last 3 Encounters:  07/26/19 104/68  09/19/18 122/65  07/30/18 117/70   Exam: Normal Speech.  NAD  Lab and Radiology Results Results for orders placed or performed in visit on 07/25/20 (from the past 72 hour(s))  CBC     Status: None   Collection Time: 07/25/20 12:00 AM  Result Value Ref Range   WBC 6.9 3.8 - 10.8 Thousand/uL   RBC 4.83 3.80 - 5.10 Million/uL   Hemoglobin 14.0 11.7 - 15.5 g/dL   HCT 09.7 94.9 - 97.1 %   MCV 87.6 80.0 - 100.0 fL   MCH 29.0 27.0 - 33.0 pg   MCHC 33.1 32.0 - 36.0 g/dL   RDW 82.0 99.0 - 68.9 %   Platelets 274 140 - 400 Thousand/uL   MPV 10.4 7.5 - 12.5 fL  COMPLETE METABOLIC PANEL WITH GFR     Status: None   Collection Time: 07/25/20 12:00 AM  Result Value Ref Range   Glucose, Bld 83 65 - 99 mg/dL    Comment: .            Fasting reference interval .    BUN 12 7 - 25  mg/dL   Creat 3.40 6.84 - 0.33 mg/dL   GFR, Est Non African American 103 > OR = 60 mL/min/1.82m2   GFR, Est African American 119 > OR = 60 mL/min/1.30m2   BUN/Creatinine Ratio NOT APPLICABLE 6 - 22 (calc)   Sodium 140 135 - 146 mmol/L   Potassium 4.1 3.5 - 5.3 mmol/L   Chloride 103 98 - 110 mmol/L   CO2 28 20 - 32 mmol/L   Calcium 9.3 8.6 - 10.2 mg/dL   Total Protein 6.6 6.1 - 8.1 g/dL   Albumin 4.2 3.6 - 5.1 g/dL   Globulin 2.4 1.9 - 3.7 g/dL (calc)   AG Ratio 1.8 1.0 - 2.5 (calc)   Total Bilirubin 0.3 0.2 - 1.2 mg/dL   Alkaline phosphatase (APISO) 67 31 - 125 U/L   AST 18 10 - 30 U/L   ALT 15 6 - 29 U/L  Lipid panel     Status: Abnormal   Collection Time: 07/25/20 12:00 AM  Result Value Ref Range   Cholesterol 203 (H) <200 mg/dL   HDL 83 > OR = 50 mg/dL   Triglycerides 533 <174 mg/dL   LDL Cholesterol (Calc) 96 mg/dL (calc)  Comment: Reference range: <100 . Desirable range <100 mg/dL for primary prevention;   <70 mg/dL for patients with CHD or diabetic patients  with > or = 2 CHD risk factors. Marland Kitchen LDL-C is now calculated using the Martin-Hopkins  calculation, which is a validated novel method providing  better accuracy than the Friedewald equation in the  estimation of LDL-C.  Horald Pollen et al. Lenox Ahr. 4193;790(24): 2061-2068  (http://education.[facility].com/faq/FAQ164)    Total CHOL/HDL Ratio 2.4 <5.0 (calc)   Non-HDL Cholesterol (Calc) 120 <130 mg/dL (calc)    Comment: For patients with diabetes plus 1 major ASCVD risk  factor, treating to a non-HDL-C goal of <100 mg/dL  (LDL-C of <09 mg/dL) is considered a therapeutic  option.   TSH     Status: None   Collection Time: 07/25/20 12:00 AM  Result Value Ref Range   TSH 1.19 mIU/L    Comment:           Reference Range .           > or = 20 Years  0.40-4.50 .                Pregnancy Ranges           First trimester    0.26-2.66           Second trimester   0.55-2.73           Third trimester    0.43-2.91     No results found.     Assessment and Plan: 35 y.o. female with The primary encounter diagnosis was Annual physical exam. Diagnoses of Generalized anxiety disorder and Chronic rhinitis were also pertinent to this visit.   PDMP not reviewed this encounter. No orders of the defined types were placed in this encounter.  Meds ordered this encounter  Medications   sertraline (ZOLOFT) 50 MG tablet    Sig: TAKE 1 TABLET BY MOUTH AT BEDTIME.    Dispense:  90 tablet    Refill:  3   fexofenadine (ALLEGRA) 180 MG tablet    Sig: Take 1 tablet (180 mg total) by mouth daily.    Dispense:  90 tablet    Refill:  3   Patient Instructions  General Preventive Care Most recent routine screening labs: all looked good!.  Blood pressure goal 130/80 or less.  Tobacco: don't!  Alcohol: responsible moderation is ok for most adults - if you have concerns about your alcohol intake, please talk to me!  Exercise: as tolerated to reduce risk of cardiovascular disease and diabetes. Strength training will also prevent osteoporosis.  Mental health: if need for mental health care (medicines, counseling, other), or concerns about moods, please let me know!  Sexual / Reproductive health: if need for STI testing, or if concerns with libido/pain problems, please let me know! If you need to discuss family planning, please let me know!  Advanced Directive: Living Will and/or Healthcare Power of Attorney recommended for all adults, regardless of age or health.  Vaccines Flu vaccine: for almost everyone, every fall.  Shingles vaccine: after age 86.  Pneumonia vaccines: after age 68. Tetanus booster: every 10 years / 3rd trimester of pregnancy COVID vaccine: THANKS for getting your vaccine! :)  Cancer screenings  Colon cancer screening: for everyone age 9-75. Breast cancer screening: mammogram at age 59 Cervical cancer screening: Pap every 5 years if normal  Lung cancer screening: not needed for  non-smokers Infection screenings  HIV: recommended screening at least once age 36-65,  more often as needed. Gonorrhea/Chlamydia: screening as needed.  Hepatitis C: recommended once for everyone age 59-75 TB: certain at-risk populations, or depending on work requirements and/or travel history Other Bone Density Test: recommended for women at age 54 Abdominal Aortic Aneurysm: screening with ultrasound recommended once for men age 42-75 who have ever smoked   Instructions sent via MyChart.   Follow Up Instructions: Return in about 1 year (around 07/26/2021) for ANNUAL CHECK-UP - SEE [place] SOONER IF NEEDED.    I discussed the assessment and treatment plan with the patient. The patient was provided an opportunity to ask questions and all were answered. The patient agreed with the plan and demonstrated an understanding of the instructions.   The patient was advised to call back or seek an in-person evaluation if any new concerns, if symptoms worsen or if the condition fails to improve as anticipated.  20 minutes of non-face-to-face time was provided during this encounter.      . . . . . . . . . . . . . Marland Kitchen                   Historical information moved to improve visibility of documentation.  Past Medical History:  Diagnosis Date   Anxiety    Asthma    exercise-induced; prn inhaler   Depression    on zoloft, doing well, cut dose.   Headache(784.0)    sinus   History of MRSA infection 2008   left leg   HPV in female    Infection    UTI   Insect bites 08/30/2013   Seasonal allergies    Surgical wound, non healing 08/2013   left breast   Vaginal Pap smear, abnormal    Past Surgical History:  Procedure Laterality Date   BREAST CYST EXCISION Left 03/18/2013   Procedure: EXCISE CYST LEFT MEDIAL BREAST;  Surgeon: Velora Heckler, MD;  Location: [facility];  Service: General;  Laterality: Left;   MASS EXCISION Left 09/01/2013   Procedure:  RE-EXCISION NON HEALING WOUND LEFT BREAST;  Surgeon: Velora Heckler, MD;  Location: [facility];  Service: General;  Laterality: Left;   TONSILLECTOMY     TONSILLECTOMY AND ADENOIDECTOMY     TYMPANOSTOMY TUBE PLACEMENT     x 3   WISDOM TOOTH EXTRACTION     Social History   Tobacco Use   Smoking status: Never   Smokeless tobacco: Never  Substance Use Topics   Alcohol use: Not Currently    Comment: rarely   family history includes Cancer in her maternal grandfather and paternal grandfather; Depression in her father; Hyperlipidemia in her father; Hypertension in her father; Hypothyroidism in her mother; Stroke in her maternal grandmother; Thyroid disease in her mother.  Medications: Current Outpatient Medications  Medication Sig Dispense Refill   albuterol (VENTOLIN HFA) 108 (90 Base) MCG/ACT inhaler Inhale 1-2 puffs into the lungs every 6 (six) hours as needed for wheezing or shortness of breath. 18 g 99   fexofenadine (ALLEGRA) 180 MG tablet Take 1 tablet (180 mg total) by mouth daily. 90 tablet 3   sertraline (ZOLOFT) 50 MG tablet TAKE 1 TABLET BY MOUTH AT BEDTIME. 90 tablet 3   No current facility-administered medications for this visit.   Allergies  Allergen Reactions   Polytrim [Polymyxin B-Trimethoprim] Other (See Comments)    OPHTHALMIC - FACIAL/EYE SWELLING     If phone visit, billing and coding can please add appropriate modifier if needed

## 2020-07-26 NOTE — Progress Notes (Signed)
Multiple attempts made over the past week by front desk to reschedule this appt d/t conflict w/ physician schedule - pt showed up at original time, labs were ordered, virtual visit scheduled for next day 07/26/2020

## 2020-08-29 ENCOUNTER — Other Ambulatory Visit (HOSPITAL_COMMUNITY): Payer: Self-pay

## 2020-09-06 ENCOUNTER — Telehealth: Payer: No Typology Code available for payment source | Admitting: Physician Assistant

## 2020-09-06 ENCOUNTER — Other Ambulatory Visit (HOSPITAL_COMMUNITY): Payer: Self-pay

## 2020-09-06 DIAGNOSIS — B373 Candidiasis of vulva and vagina: Secondary | ICD-10-CM

## 2020-09-06 DIAGNOSIS — B3731 Acute candidiasis of vulva and vagina: Secondary | ICD-10-CM

## 2020-09-06 MED ORDER — FLUCONAZOLE 150 MG PO TABS
150.0000 mg | ORAL_TABLET | Freq: Once | ORAL | 0 refills | Status: AC
Start: 2020-09-06 — End: 2020-09-07
  Filled 2020-09-06: qty 1, 1d supply, fill #0

## 2020-09-06 NOTE — Progress Notes (Signed)

## 2020-09-18 ENCOUNTER — Other Ambulatory Visit (HOSPITAL_COMMUNITY): Payer: Self-pay

## 2020-09-18 ENCOUNTER — Telehealth: Payer: No Typology Code available for payment source | Admitting: Physician Assistant

## 2020-09-18 DIAGNOSIS — Z20822 Contact with and (suspected) exposure to covid-19: Secondary | ICD-10-CM

## 2020-09-18 DIAGNOSIS — R0981 Nasal congestion: Secondary | ICD-10-CM

## 2020-09-18 MED ORDER — AMOXICILLIN-POT CLAVULANATE 875-125 MG PO TABS
1.0000 | ORAL_TABLET | Freq: Two times a day (BID) | ORAL | 0 refills | Status: DC
  Filled 2020-09-18: qty 14, 7d supply, fill #0

## 2020-09-18 MED ORDER — FLUCONAZOLE 150 MG PO TABS
150.0000 mg | ORAL_TABLET | Freq: Once | ORAL | 0 refills | Status: AC
  Filled 2020-09-18: qty 1, 1d supply, fill #0

## 2020-09-18 NOTE — Progress Notes (Signed)
E-Visit for Corona Virus Screening  Your current symptoms could be consistent with the coronavirus.  Many health care providers can now test patients at their office but not all are.  [facility] has multiple testing sites. For information on our COVID testing locations and hours go to [email or web address]  We are enrolling you in our MyChart Home Monitoring for COVID19 . Daily you will receive a questionnaire within the MyChart website. Our COVID 19 response team will be monitoring your responses daily.  Testing Information: The COVID-19 Community Testing sites are testing BY APPOINTMENT ONLY.  You can schedule online at [email or web address]  If you do not have access to a smart phone or computer you may call [phone] for an appointment.   Additional testing sites in the Community:  For [facility] Testing sites in [place]  [email or web address]  For Pop-up testing sites in [place]  [email or web address]  For [facility] [email or web address]  For [facility] testing in [place] and [employer] [email or web address]  For [facility] testing in [place]   [email or web address]  For  more information about community testing call [phone]   Please quarantine yourself while awaiting your test results. Please stay home for a minimum of 10 days from the first day of illness with improving symptoms and you have had 24 hours of no fever (without the use of Tylenol (Acetaminophen) Motrin (Ibuprofen) or any fever reducing medication).  Also - Do not get tested prior to returning to work because once you have had a positive test the test can stay positive for  more than a month in some cases.   You should wear a mask or cloth face covering over your nose and mouth if you must be around other people or animals, including pets (even at home). Try to stay at least 6 feet away from other people. This will protect the people around you.  Please continue good preventive care measures, including:  frequent hand-washing, avoid touching your face, cover coughs/sneezes, stay out of crowds and keep a 6 foot distance from others.  COVID-19 is a respiratory illness with symptoms that are similar to the flu. Symptoms are typically mild to moderate, but there have been cases of severe illness and death due to the virus.   The following symptoms may appear 2-14 days after exposure: Fever Cough Shortness of breath or difficulty breathing Chills Repeated shaking with chills Muscle pain Headache Sore throat New loss of taste or smell Fatigue Congestion or runny nose Nausea or vomiting Diarrhea  Go to the nearest hospital ED for assessment if fever/cough/breathlessness are severe or illness seems like a threat to life.  It is vitally important that if you feel that you have an infection such as this virus or any other virus that you stay home and away from places where you may spread it to others.  You should avoid contact with people age 51 and older.   I would continue using the medication you have listed including the nasal sprays and decongestants.  You may also take acetaminophen (Tylenol) as needed for fever.  Reduce your risk of any infection by using the same precautions used for avoiding the common cold or flu:  Wash your hands often with soap and warm water for at least 20 seconds.  If soap and water are not readily available, use an alcohol-based hand sanitizer with at least 60% alcohol.  If coughing or sneezing, cover your mouth and nose by coughing or sneezing into the elbow areas of  your shirt or coat, into a tissue or into your sleeve (not your  hands). Avoid shaking hands with others and consider head nods or verbal greetings only. Avoid touching your eyes, nose, or mouth with unwashed hands.  Avoid close contact with people who are sick. Avoid places or events with large numbers of people in one location, like concerts or sporting events. Carefully consider travel plans you have or are making. If you are planning any travel outside or inside the [place], visit the CDC's Travelers' Health webpage for the latest health notices. If you have some symptoms but not all symptoms, continue to monitor at home and seek medical attention if your symptoms worsen. If you are having a medical emergency, call 911.  HOME CARE Only take medications as instructed by your medical team. Drink plenty of fluids and get plenty of rest. A steam or ultrasonic humidifier can help if you have congestion.   GET HELP RIGHT AWAY IF YOU HAVE EMERGENCY WARNING SIGNS** FOR COVID-19. If you or someone is showing any of these signs seek emergency medical care immediately. Call 911 or proceed to your closest emergency facility if: You develop worsening high fever. Trouble breathing Bluish lips or face Persistent pain or pressure in the chest New confusion Inability to wake or stay awake You cough up blood. Your symptoms become more severe  **This list is not all possible symptoms. Contact your medical provider for any symptoms that are sever or concerning to you.  MAKE SURE YOU  Understand these instructions. Will watch your condition. Will get help right away if you are not doing well or get worse.  Your e-visit answers were reviewed by a board certified advanced clinical practitioner to complete your personal care plan.  Depending on the condition, your plan could have included both over the counter or prescription medications.  If there is a problem please reply once you have received a response from your provider.  Your safety is important to [place].  If you  have drug allergies check your prescription carefully.    You can use MyChart to ask questions about today's visit, request a non-urgent call back, or ask for a work or school excuse for 24 hours related to this e-Visit. If it has been greater than 24 hours you will need to follow up with your provider, or enter a new e-Visit to address those concerns. You will get an e-mail in the next two days asking about your experience.  I hope that your e-visit has been valuable and will speed your recovery. Thank you for using e-visits.   Approximately 5 minutes was spent documenting and reviewing patient's chart.

## 2020-09-18 NOTE — Addendum Note (Signed)
Addended by: Roxy Horseman B on: 09/18/2020 12:47 PM   Modules accepted: Orders

## 2020-11-12 ENCOUNTER — Other Ambulatory Visit: Payer: Self-pay

## 2020-11-12 ENCOUNTER — Ambulatory Visit
Admission: RE | Admit: 2020-11-12 | Discharge: 2020-11-12 | Disposition: A | Discharge status: Home / Self Care | Payer: No Typology Code available for payment source | Source: Ambulatory Visit | Attending: Osteopathic Medicine | Admitting: Osteopathic Medicine

## 2020-11-16 ENCOUNTER — Ambulatory Visit (INDEPENDENT_AMBULATORY_CARE_PROVIDER_SITE_OTHER): Payer: No Typology Code available for payment source | Admitting: Sports Medicine

## 2020-11-16 ENCOUNTER — Other Ambulatory Visit: Payer: Self-pay

## 2020-11-16 ENCOUNTER — Other Ambulatory Visit (HOSPITAL_COMMUNITY): Payer: Self-pay

## 2020-11-16 ENCOUNTER — Ambulatory Visit (INDEPENDENT_AMBULATORY_CARE_PROVIDER_SITE_OTHER): Payer: No Typology Code available for payment source

## 2020-11-16 DIAGNOSIS — S99911A Unspecified injury of right ankle, initial encounter: Secondary | ICD-10-CM

## 2020-11-16 DIAGNOSIS — M25571 Pain in right ankle and joints of right foot: Secondary | ICD-10-CM | POA: Diagnosis not present

## 2020-11-16 HISTORY — DX: Unspecified injury of right ankle, initial encounter: S99.911A

## 2020-11-16 MED ORDER — MELOXICAM 15 MG PO TABS
ORAL_TABLET | ORAL | 3 refills | Status: DC
Start: 2020-11-16 — End: 2021-01-25
  Filled 2020-11-16: qty 30, 30d supply, fill #0

## 2020-11-16 NOTE — Assessment & Plan Note (Signed)
This is a pleasant 35 year old female, she recently rolled her ankle, felt a pop, could not bear weight for the first few minutes after the injury. On exam she has tenderness near the CFL, ankle is however stable, moderate swelling. ASO, meloxicam, x-rays, home conditioning given. Return to see me in 3 weeks.

## 2020-11-16 NOTE — Progress Notes (Signed)
    Procedures performed today:    None.  Independent interpretation of notes and tests performed by another provider:   None.  Brief History, Exam, Impression, and Recommendations:    Right ankle injury This is a pleasant 35 year old female, she recently rolled her ankle, felt a pop, could not bear weight for the first few minutes after the injury. On exam she has tenderness near the CFL, ankle is however stable, moderate swelling. ASO, meloxicam, x-rays, home conditioning given. Return to see me in 3 weeks.    ___________________________________________ Ihor Austin. Benjamin Stain, M.D., ABFM., CAQSM. Primary Care and Sports Medicine [facility]  Adjunct Instructor of Family Medicine  [employer]

## 2020-11-22 ENCOUNTER — Telehealth: Payer: No Typology Code available for payment source | Admitting: Physician Assistant

## 2020-11-22 DIAGNOSIS — J069 Acute upper respiratory infection, unspecified: Secondary | ICD-10-CM | POA: Diagnosis not present

## 2020-11-22 MED ORDER — PREDNISONE 10 MG (21) PO TBPK: ORAL_TABLET | ORAL | 0 refills | Status: DC

## 2020-11-22 MED ORDER — BENZONATATE 100 MG PO CAPS
100.0000 mg | ORAL_CAPSULE | Freq: Three times a day (TID) | ORAL | 0 refills | Status: DC | PRN
Start: 2020-11-22 — End: 2020-12-28

## 2020-11-22 NOTE — Progress Notes (Signed)
We are sorry that you are not feeling well.  Here is how we plan to help! ° °Based on your presentation I believe you most likely have A cough due to a virus.  This is called viral bronchitis and is best treated by rest, plenty of fluids and control of the cough.  You may use Ibuprofen or Tylenol as directed to help your symptoms.   °  °In addition you may use A prescription cough medication called Tessalon Perles 100mg. You may take 1-2 capsules every 8 hours as needed for your cough. ° °Prednisone 10 mg daily for 6 days (see taper instructions below) ° °Directions for 6 day taper: °Day 1: 2 tablets before breakfast, 1 after both lunch & dinner and 2 at bedtime °Day 2: 1 tab before breakfast, 1 after both lunch & dinner and 2 at bedtime °Day 3: 1 tab at each meal & 1 at bedtime °Day 4: 1 tab at breakfast, 1 at lunch, 1 at bedtime °Day 5: 1 tab at breakfast & 1 tab at bedtime °Day 6: 1 tab at breakfast ° °From your responses in the eVisit questionnaire you describe inflammation in the upper respiratory tract which is causing a significant cough.  This is commonly called Bronchitis and has four common causes:   °Allergies °Viral Infections °Acid Reflux °Bacterial Infection °Allergies, viruses and acid reflux are treated by controlling symptoms or eliminating the cause. An example might be a cough caused by taking certain blood pressure medications. You stop the cough by changing the medication. Another example might be a cough caused by acid reflux. Controlling the reflux helps control the cough. ° °USE OF BRONCHODILATOR ("RESCUE") INHALERS: °There is a risk from using your bronchodilator too frequently.  The risk is that over-reliance on a medication which only relaxes the muscles surrounding the breathing tubes can reduce the effectiveness of medications prescribed to reduce swelling and congestion of the tubes themselves.  Although you feel brief relief from the bronchodilator inhaler, your asthma may actually be  worsening with the tubes becoming more swollen and filled with mucus.  This can delay other crucial treatments, such as oral steroid medications. If you need to use a bronchodilator inhaler daily, several times per day, you should discuss this with your provider.  There are probably better treatments that could be used to keep your asthma under control.  °   °HOME CARE °Only take medications as instructed by your medical team. °Complete the entire course of an antibiotic. °Drink plenty of fluids and get plenty of rest. °Avoid close contacts especially the very young and the elderly °Cover your mouth if you cough or cough into your sleeve. °Always remember to wash your hands °A steam or ultrasonic humidifier can help congestion.  ° °GET HELP RIGHT AWAY IF: °You develop worsening fever. °You become short of breath °You cough up blood. °Your symptoms persist after you have completed your treatment plan °MAKE SURE YOU  °Understand these instructions. °Will watch your condition. °Will get help right away if you are not doing well or get worse. °  ° °Thank you for choosing an e-visit. ° °Your e-visit answers were reviewed by a board certified advanced clinical practitioner to complete your personal care plan. Depending upon the condition, your plan could have included both over the counter or prescription medications. ° °Please review your pharmacy choice. Make sure the pharmacy is open so you can pick up prescription now. If there is a problem, you may contact your provider   through MyChart messaging and have the prescription routed to another pharmacy.  Your safety is important to us. If you have drug allergies check your prescription carefully.  ° °For the next 24 hours you can use MyChart to ask questions about today's visit, request a non-urgent call back, or ask for a work or school excuse. °You will get an email in the next two days asking about your experience. I hope that your e-visit has been valuable and will  speed your recovery. ° °I provided 5 minutes of non face-to-face time during this encounter for chart review and documentation.  ° °

## 2020-11-30 ENCOUNTER — Other Ambulatory Visit (HOSPITAL_COMMUNITY): Payer: Self-pay

## 2020-12-06 ENCOUNTER — Other Ambulatory Visit (HOSPITAL_COMMUNITY): Payer: Self-pay

## 2020-12-06 MED ORDER — FLUARIX QUADRIVALENT 0.5 ML IM SUSY
PREFILLED_SYRINGE | INTRAMUSCULAR | 0 refills | Status: DC
  Filled 2020-12-06: qty 0.5, 1d supply, fill #0

## 2020-12-07 ENCOUNTER — Ambulatory Visit: Payer: No Typology Code available for payment source | Admitting: Sports Medicine

## 2020-12-28 ENCOUNTER — Ambulatory Visit (INDEPENDENT_AMBULATORY_CARE_PROVIDER_SITE_OTHER): Payer: No Typology Code available for payment source | Admitting: Sports Medicine

## 2020-12-28 ENCOUNTER — Other Ambulatory Visit: Payer: Self-pay

## 2020-12-28 DIAGNOSIS — S99911D Unspecified injury of right ankle, subsequent encounter: Secondary | ICD-10-CM

## 2020-12-28 NOTE — Progress Notes (Signed)
    Procedures performed today:    None.  Independent interpretation of notes and tests performed by another provider:   None.  Brief History, Exam, Impression, and Recommendations:    Fibular avulsion fracture, ATFL sprain right ankle This is a very pleasant 35 year old female, 6 weeks ago she rolled her ankle and felt a pop, could not bear weight. On exam she had tenderness near the CFL, fibular tip, x-rays did show a fibular tip avulsion. Now 6 weeks later she is in an ASO, has improved to some degree but still has significant discomfort and does not quite trust the ankle, it feels as though it will give away. This is pretty typical 6 weeks out from a fracture, I have advised her to continue the conditioning exercises aggressively and wear the ASO when up and about. We can touch base in 4 to 6 weeks in a virtual visit to see how things are going, if insufficient improvement we will consider MRI +/- formal physical therapy.    ___________________________________________ Ihor Austin. Benjamin Stain, M.D., ABFM., CAQSM. Primary Care and Sports Medicine [facility]  Adjunct Instructor of Family Medicine  [employer]

## 2020-12-28 NOTE — Assessment & Plan Note (Signed)
This is a very pleasant 35 year old female, 6 weeks ago she rolled her ankle and felt a pop, could not bear weight. On exam she had tenderness near the CFL, fibular tip, x-rays did show a fibular tip avulsion. Now 6 weeks later she is in an ASO, has improved to some degree but still has significant discomfort and does not quite trust the ankle, it feels as though it will give away. This is pretty typical 6 weeks out from a fracture, I have advised her to continue the conditioning exercises aggressively and wear the ASO when up and about. We can touch base in 4 to 6 weeks in a virtual visit to see how things are going, if insufficient improvement we will consider MRI +/- formal physical therapy.

## 2021-01-01 ENCOUNTER — Other Ambulatory Visit: Payer: Self-pay

## 2021-01-01 ENCOUNTER — Ambulatory Visit (INDEPENDENT_AMBULATORY_CARE_PROVIDER_SITE_OTHER): Payer: No Typology Code available for payment source | Admitting: Nurse Practitioner

## 2021-01-01 ENCOUNTER — Encounter (HOSPITAL_BASED_OUTPATIENT_CLINIC_OR_DEPARTMENT_OTHER): Payer: Self-pay | Admitting: Nurse Practitioner

## 2021-01-01 ENCOUNTER — Other Ambulatory Visit (HOSPITAL_COMMUNITY): Payer: Self-pay

## 2021-01-01 VITALS — BP 117/72 | HR 81 | Ht 63.0 in | Wt 241.8 lb

## 2021-01-01 DIAGNOSIS — L301 Dyshidrosis [pompholyx]: Secondary | ICD-10-CM | POA: Insufficient documentation

## 2021-01-01 DIAGNOSIS — Z8759 Personal history of other complications of pregnancy, childbirth and the puerperium: Secondary | ICD-10-CM

## 2021-01-01 DIAGNOSIS — F411 Generalized anxiety disorder: Secondary | ICD-10-CM | POA: Diagnosis not present

## 2021-01-01 DIAGNOSIS — Z6841 Body Mass Index (BMI) 40.0 and over, adult: Secondary | ICD-10-CM

## 2021-01-01 DIAGNOSIS — L309 Dermatitis, unspecified: Secondary | ICD-10-CM | POA: Insufficient documentation

## 2021-01-01 DIAGNOSIS — E782 Mixed hyperlipidemia: Secondary | ICD-10-CM

## 2021-01-01 DIAGNOSIS — F419 Anxiety disorder, unspecified: Secondary | ICD-10-CM | POA: Insufficient documentation

## 2021-01-01 DIAGNOSIS — Z0000 Encounter for general adult medical examination without abnormal findings: Secondary | ICD-10-CM

## 2021-01-01 MED ORDER — OZEMPIC (0.25 OR 0.5 MG/DOSE) 2 MG/1.5ML ~~LOC~~ SOPN
PEN_INJECTOR | SUBCUTANEOUS | 1 refills | Status: DC
  Filled 2021-01-01: qty 1.5, 42d supply, fill #0
  Filled 2021-02-07: qty 1.5, 28d supply, fill #1

## 2021-01-01 MED ORDER — PEN NEEDLES 32G X 4 MM MISC
11 refills | Status: DC
  Filled 2021-01-01: qty 30, fill #0

## 2021-01-01 NOTE — Assessment & Plan Note (Signed)
Weight gain with significant difficulty losing weight despite diet, exercise, nutritionist, counting macro/micros, etc.  Has tried weight watchers, Keto diet, and previously Contrave which she was unable to tolerate.  Appx 50lb weight gain since starting hormonal therapy for IVF about 3 years ago. Unable to loose and keep the weight off.  Discussed options for medical treatment including GLP-1 therapy. She is interested in trying this.  Will send script for Ozempic for CV risk reduction in setting of elevated BMI, HLD, and Hx of HTN in recent pregnancy.  Will monitor A1c and insulin levels today.  F/U in 6 weeks to monitor progression.  Repeat labs in 3 months.

## 2021-01-01 NOTE — Patient Instructions (Addendum)
We will plan to start Ozempic. This will need a prior authorization so may take a few days to come through, but the pharmacy will notify you when the prescription is ready. If we need to change to a different medication due to non-authorization, I will let you know.   We will plan to follow-up in 6 weeks with phone or video visit to see how you are doing on the medication.   Thank you for choosing [place] [facility] for your Primary Care needs. I am excited for the opportunity to partner with you to meet your health care goals. It was a pleasure meeting you today!  Information on diet, exercise, and health maintenance recommendations are listed below. This is information to help you be sure you are on track for optimal health and monitoring.   Please look over this and let us know if you have any questions or if you have completed any of the health maintenance outside of [facility] so that we can be sure your records are up to date.  ___________________________________________________________ About Me: I am an Adult-Geriatric Nurse Practitioner with a background in caring for patients for more than 20 years with a strong intensive care background. I provide primary care and sports medicine services to patients age 36 and older within this office. My education had a strong focus on caring for the older adult population, which I am passionate about. I am also the director of the APP Fellowship with [facility].   My desire is to provide you with the best service through preventive medicine and supportive care. I consider you a part of the medical team and value your input. I work diligently to ensure that you are heard and your needs are met in a safe and effective manner. I want you to feel comfortable with me as your provider and want you to know that your health concerns are important to me.  For your information, our office hours are: Monday, Tuesday, and Thursday 8:00  AM - 5:00 PM Wednesday and Friday 8:00 AM - 12:00 PM.   In my time away from the office I am teaching new APP's within the system and am unavailable, but my partner, Dr. Burnard Bunting is in the office for emergent needs.   If you have questions or concerns, please call our office at [phone] or send [place] a MyChart message and we will respond as quickly as possible.  ____________________________________________________________ MyChart:  For all urgent or time sensitive needs we ask that you please call the office to avoid delays. Our number is [phone]. MyChart is not constantly monitored and due to the large volume of messages a day, replies may take up to 72 business hours.  MyChart Policy: MyChart allows for you to see your visit notes, after visit summary, provider recommendations, lab and tests results, make an appointment, request refills, and contact your provider or the office for non-urgent questions or concerns. Providers are seeing patients during normal business hours and do not have built in time to review MyChart messages.  We ask that you allow a minimum of 3 business days for responses to [employer]. For this reason, please do not send urgent requests through [place]. Please call the office at [phone]. New and ongoing conditions may require a visit. We have virtual and in person visit available for your convenience.  Complex MyChart concerns may require a visit. Your provider may request you schedule a virtual or in person visit to  ensure we are providing the best care possible. MyChart messages sent after 11:00 AM on Friday will not be received by the provider until Monday morning.    Lab and Test Results: You will receive your lab and test results on MyChart as soon as they are completed and results have been sent by the lab or testing facility. Due to this service, you will receive your results BEFORE your provider.  I review lab and tests results each morning prior  to seeing patients. Some results require collaboration with other providers to ensure you are receiving the most appropriate care. For this reason, we ask that you please allow a minimum of 3-5 business days from the time the ALL results have been received for your provider to receive and review lab and test results and contact you about these.  Most lab and test result comments from the provider will be sent through [place]. Your provider may recommend changes to the plan of care, follow-up visits, repeat testing, ask questions, or request an office visit to discuss these results. You may reply directly to this message or call the office at [phone] to provide information for the provider or set up an appointment. In some instances, you will be called with test results and recommendations. Please let us know if this is preferred and we will make note of this in your chart to provide this for you.    If you have not heard a response to your lab or test results in 5 business days from all results returning to [place], please call the office to let us know. We ask that you please avoid calling prior to this time unless there is an emergent concern. Due to high call volumes, this can delay the resulting process.  After Hours: For all non-emergency after hours needs, please call the office at [phone] and select the option to reach the on-call provider service. On-call services are shared between multiple [place] offices and therefore it will not be possible to speak directly with your provider. On-call providers may provide medical advice and recommendations, but are unable to provide refills for maintenance medications.  For all emergency or urgent medical needs after normal business hours, we recommend that you seek care at the closest Urgent Care or Emergency Department to ensure appropriate treatment in a timely manner.  [facility] [place] has a 24 hour emergency room located on  the ground floor for your convenience.   Urgent Concerns During the Business Day Providers are seeing patients from 8AM to [place] with a busy schedule and are most often not able to respond to non-urgent calls until the end of the day or the next business day. If you should have URGENT concerns during the day, please call and speak to the nurse or schedule a same day appointment so that we can address your concern without delay.   Thank you, again, for choosing me as your health care partner. I appreciate your trust and look forward to learning more about you.   Worthy Keeler, DNP, AGNP-c ___________________________________________________________  Health Maintenance Recommendations Screening Testing Mammogram Every 1 -2 years based on history and risk factors Starting at age 34 Pap Smear Ages 21-39 every 3 years Ages 12-65 every 5 years with HPV testing More frequent testing may be required based on results and history Colon Cancer Screening Every 1-10 years based on test performed, risk factors, and history Starting at age 63 Bone Density Screening Every 2-10 years based on history  Starting at age 67 for women Recommendations for men differ based on medication usage, history, and risk factors AAA Screening One time ultrasound Men 66-49 years old who have every smoked Lung Cancer Screening Low Dose Lung CT every 12 months Age 26-80 years with a 30 pack-year smoking history who still smoke or who have quit within the last 15 years  Screening Labs Routine  Labs: Complete Blood Count (CBC), Complete Metabolic Panel (CMP), Cholesterol (Lipid Panel) Every 6-12 months based on history and medications May be recommended more frequently based on current conditions or previous results Hemoglobin A1c Lab Every 3-12 months based on history and previous results Starting at age 35 or earlier with diagnosis of diabetes, high cholesterol, BMI >26, and/or risk factors Frequent monitoring for  patients with diabetes to ensure blood sugar control Thyroid Panel (TSH w/ T3 & T4) Every 6 months based on history, symptoms, and risk factors May be repeated more often if on medication HIV One time testing for all patients 20 and older May be repeated more frequently for patients with increased risk factors or exposure Hepatitis C One time testing for all patients 26 and older May be repeated more frequently for patients with increased risk factors or exposure Gonorrhea, Chlamydia Every 12 months for all sexually active persons 13-24 years Additional monitoring may be recommended for those who are considered high risk or who have symptoms PSA Men 31-64 years old with risk factors Additional screening may be recommended from age 89-69 based on risk factors, symptoms, and history  Vaccine Recommendations Tetanus Booster All adults every 10 years Flu Vaccine All patients 6 months and older every year COVID Vaccine All patients 12 years and older Initial dosing with booster May recommend additional booster based on age and health history HPV Vaccine 2 doses all patients age 2-26 Dosing may be considered for patients over 26 Shingles Vaccine (Shingrix) 2 doses all adults 32 years and older Pneumonia (Pneumovax 23) All adults 29 years and older May recommend earlier dosing based on health history Pneumonia (Prevnar 49) All adults 64 years and older Dosed 1 year after Pneumovax 23  Additional Screening, Testing, and Vaccinations may be recommended on an individualized basis based on family history, health history, risk factors, and/or exposure.  __________________________________________________________  Diet Recommendations for All Patients  I recommend that all patients maintain a diet low in saturated fats, carbohydrates, and cholesterol. While this can be challenging at first, it is not impossible and small changes can make big differences.  Things to try: Decreasing the  amount of soda, sweet tea, and/or juice to one or less per day and replace with water While water is always the first choice, if you do not like water you may consider adding a water additive without sugar to improve the taste other sugar free drinks Replace potatoes with a brightly colored vegetable at dinner Use healthy oils, such as canola oil or olive oil, instead of butter or hard margarine Limit your bread intake to two pieces or less a day Replace regular pasta with low carb pasta options Bake, broil, or grill foods instead of frying Monitor portion sizes  Eat smaller, more frequent meals throughout the day instead of large meals  An important thing to remember is, if you love foods that are not great for your health, you don't have to give them up completely. Instead, allow these foods to be a reward when you have done well. Allowing yourself to still have special treats every once in  a while is a nice way to tell yourself thank you for working hard to keep yourself healthy.   Also remember that every day is a new day. If you have a bad day and "fall off the wagon", you can still climb right back up and keep moving along on your journey!  We have resources available to help you!  Some websites that may be helpful include: www.http://carter.biz/  Www.VeryWellFit.com _____________________________________________________________  Activity Recommendations for All Patients  I recommend that all adults get at least 20 minutes of moderate physical activity that elevates your heart rate at least 5 days out of the week.  Some examples include: Walking or jogging at a pace that allows you to carry on a conversation Cycling (stationary bike or outdoors) Water aerobics Yoga Weight lifting Dancing If physical limitations prevent you from putting stress on your joints, exercise in a pool or seated in a chair are excellent options.  Do determine your MAXIMUM heart rate for activity: YOUR AGE -  220 = MAX HeartRate   Remember! Do not push yourself too hard.  Start slowly and build up your pace, speed, weight, time in exercise, etc.  Allow your body to rest between exercise and get good sleep. You will need more water than normal when you are exerting yourself. Do not wait until you are thirsty to drink. Drink with a purpose of getting in at least 8, 8 ounce glasses of water a day plus more depending on how much you exercise and sweat.    If you begin to develop dizziness, chest pain, abdominal pain, jaw pain, shortness of breath, headache, vision changes, lightheadedness, or other concerning symptoms, stop the activity and allow your body to rest. If your symptoms are severe, seek emergency evaluation immediately. If your symptoms are concerning, but not severe, please let us know so that we can recommend further evaluation.

## 2021-01-01 NOTE — Assessment & Plan Note (Signed)
Hx of severe pre-eclampsia and HTN with recent pregnancy.  BP is stable today.  No red flags present.  Given history, GLP-1 inhibitor medication will help reduce CV risks associated with HLD, BMI and hopefully help prevent recurrent HTN.  Will plan to start today and f/u in 6 weeks.

## 2021-01-01 NOTE — Progress Notes (Addendum)
Deanna Eth, DNP, AGNP-c Primary Care & Sports Medicine [address]  [address] [place], [place] [address] [phone]  New patient visit   Patient: Deanna Gross   DOB: [birth year]   35 y.o. Female  MRN: [id number] Visit Date: 01/01/2021  Patient Care Team: Deanna Gross, Deanna Amabile, NP as PCP - General (Nurse Practitioner)  Today's healthcare provider: Tollie Eth, NP   Chief Complaint  Patient presents with  . New Patient (Initial Visit)    Patient is here to establish care. No refills needed today on any medication. She is interested in discussing weight loss options. She did mention Mounjaro, she would like providers advice on that. No other concerns today. She works downstairs in the Cancer center. She has had her flu shot 11/2020.   Subjective    HPI  Deanna Gross is a 35 y.o. female who presents today as a new patient to establish care.    Deanna reports that she has had about a 50lb weight gain since initially starting IVF therapy a little over 3 years ago. She reports that in the past she has been able to manage her weight with diet and exercise, but over the past 6 months she has been unsuccessful in any weight loss and keeping the weight off.  She has tried Contrave, but was unable to tolerate the medication.  She has tried [employer], low calorie diet, Weight Watchers, counting macros, and routinely works with a nutritionist.  She works out routinely on a consistent basis with a variety of weight training and CV activities.  She reports that she and her husband are on the same routine and he has had significant success, while her weight has yo-yo'ed up and down within a 10 pound window.   She endorses concern over her health and particularly her CV health with increased weight. She has read about Mounjaro and would like to discuss this class of medication today.   Deanna Gross endorses a history of anxiety, mostly related to life factors and school. She has been  stable on sertraline 50mg  for about 3 years now and is doing well. She recently completed her masters program and would like to discuss the option of tapering off of the medication in the future.   Past Medical History:  Diagnosis Date  . Anxiety   . Asthma    exercise-induced; prn inhaler  . Chronic infection of sinus 05/03/2015  . Depression    on zoloft, doing well, cut dose.  . Fibular avulsion fracture, ATFL sprain right ankle 11/16/2020  . Headache(784.0)    sinus  . History of abnormal cervical Pap smear 07/26/2019  . History of MRSA infection 2008   left leg  . HPV in female   . Infection    UTI  . Insect bites 08/30/2013  . IT band syndrome 05/03/2015  . NSVD (normal spontaneous vaginal delivery) 09/17/2018  . Obesity (BMI 30-39.9) 05/03/2015  . PIH (pregnancy induced hypertension), third trimester 09/16/2018  . Preeclampsia, severe, third trimester 09/16/2018  . Recurrent knee pain 05/03/2015  . Seasonal allergies   . Surgical wound, non healing 08/2013   left breast  . Tendinitis, de Quervain's 05/08/2017  . Uses condoms for contraception 05/23/2015  . Vaginal Pap smear, abnormal    Past Surgical History:  Procedure Laterality Date  . BREAST CYST EXCISION Left 03/18/2013   Procedure: EXCISE CYST LEFT MEDIAL BREAST;  Surgeon: 03/20/2013, MD;  Location: [facility];  Service: General;  Laterality: Left;  Marland Kitchen MASS EXCISION Left 09/01/2013   Procedure: RE-EXCISION NON HEALING WOUND LEFT BREAST;  Surgeon: Earnstine Regal, MD;  Location: [facility];  Service: General;  Laterality: Left;  . TONSILLECTOMY    . TONSILLECTOMY AND ADENOIDECTOMY    . TYMPANOSTOMY TUBE PLACEMENT     x 3  . WISDOM TOOTH EXTRACTION     Family Status  Relation Name Status  . Father  Alive  . Mother  Alive  . MGF  (Not Specified)  . PGF  (Not Specified)  . MGM  (Not Specified)   Family History  Problem Relation Age of Onset  . Hyperlipidemia Father   . Hypertension Father    . Depression Father   . Hypothyroidism Mother   . Thyroid disease Mother   . Cancer Maternal Grandfather        leukemia  . Cancer Paternal Grandfather        non-hodgkins lymphoma  . Stroke Maternal Grandmother    Social History   Socioeconomic History  . Marital status: Married    Spouse name: Not on file  . Number of children: Not on file  . Years of education: Not on file  . Highest education level: Not on file  Occupational History  . Not on file  Tobacco Use  . Smoking status: Never  . Smokeless tobacco: Never  Vaping Use  . Vaping Use: Never used  Substance and Sexual Activity  . Alcohol use: Not Currently    Comment: rarely  . Drug use: No  . Sexual activity: Yes    Birth control/protection: None  Other Topics Concern  . Not on file  Social History Narrative  . Not on file   Social Determinants of Health   Financial Resource Strain: Not on file  Food Insecurity: Not on file  Transportation Needs: Not on file  Physical Activity: Not on file  Stress: Not on file  Social Connections: Not on file   Outpatient Medications Prior to Visit  Medication Sig  . albuterol (VENTOLIN HFA) 108 (90 Base) MCG/ACT inhaler Inhale 1-2 puffs into the lungs every 6 (six) hours as needed for wheezing or shortness of breath.  . fexofenadine (ALLEGRA) 180 MG tablet Take 1 tablet (180 mg total) by mouth daily.  . meloxicam (MOBIC) 15 MG tablet Take 1 tablet by mouth with a meal each morning for 2 weeks and then take 1 tablet once daily as needed for pain. (Patient not taking: Reported on 12/28/2020)  . sertraline (ZOLOFT) 50 MG tablet TAKE 1 TABLET BY MOUTH AT BEDTIME.   No facility-administered medications prior to visit.   Allergies  Allergen Reactions  . Polytrim [Polymyxin B-Trimethoprim] Other (See Comments)    OPHTHALMIC - FACIAL/EYE SWELLING    Immunization History  Administered Date(s) Administered  . Influenza,inj,Quad PF,6+ Mos 11/12/2018, 12/06/2020  .  Influenza-Unspecified 10/14/2017  . PFIZER(Purple Top)SARS-COV-2 Vaccination 02/16/2019, 03/09/2019, 01/26/2020  . Tdap 01/27/2009, 07/02/2018    Health Maintenance  Topic Date Due  . COVID-19 Vaccine (4 - Booster for Pfizer series) 03/22/2020  . PAP SMEAR-Modifier  07/26/2022  . TETANUS/TDAP  07/01/2028  . INFLUENZA VACCINE  Completed  . Hepatitis C Screening  Completed  . HIV Screening  Completed  . Pneumococcal Vaccine 67-50 Years old  Aged Out  . HPV VACCINES  Aged Out    Patient Care Team: Teira Arcilla, Coralee Pesa, NP as PCP - General (Nurse Practitioner)  Review of Systems All review of  systems negative except what is listed in the HPI   Objective    BP 117/72   Pulse 81   Ht 5\' 3"  (1.6 m)   Wt 241 lb 12.8 oz (109.7 kg)   SpO2 97%   BMI 42.83 kg/m  Physical Exam Vitals and nursing note reviewed.  Constitutional:      General: She is not in acute distress.    Appearance: Normal appearance.  Eyes:     Extraocular Movements: Extraocular movements intact.     Conjunctiva/sclera: Conjunctivae normal.     Pupils: Pupils are equal, round, and reactive to light.  Neck:     Vascular: No carotid bruit.  Cardiovascular:     Rate and Rhythm: Normal rate and regular rhythm.     Pulses: Normal pulses.     Heart sounds: Normal heart sounds. No murmur heard. Pulmonary:     Effort: Pulmonary effort is normal.     Breath sounds: Normal breath sounds. No wheezing.  Abdominal:     General: Bowel sounds are normal.     Palpations: Abdomen is soft.  Musculoskeletal:        General: Normal range of motion.     Cervical back: Normal range of motion. No tenderness.     Right lower leg: No edema.     Left lower leg: No edema.  Lymphadenopathy:     Cervical: No cervical adenopathy.  Skin:    General: Skin is warm and dry.     Capillary Refill: Capillary refill takes less than 2 seconds.  Neurological:     General: No focal deficit present.     Mental Status: She is alert and oriented  to person, place, and time.  Psychiatric:        Mood and Affect: Mood normal.        Behavior: Behavior normal.        Thought Content: Thought content normal.        Judgment: Judgment normal.    Depression Screen PHQ 2/9 Scores 01/01/2021 07/26/2019  PHQ - 2 Score 0 0  PHQ- 9 Score 2 0  Some encounter information is confidential and restricted. Go to Review Flowsheets activity to see all data.   No results found for any visits on 01/01/21.  Assessment & Plan      Problem List Items Addressed This Visit     History of pre-eclampsia    Hx of severe pre-eclampsia and HTN with recent pregnancy.  BP is stable today.  No red flags present.  Given history, GLP-1 inhibitor medication will help reduce CV risks associated with HLD, BMI and hopefully help prevent recurrent HTN.  Will plan to start today and f/u in 6 weeks.       Relevant Medications   Semaglutide,0.25 or 0.5MG /DOS, (OZEMPIC, 0.25 OR 0.5 MG/DOSE,) 2 MG/1.5ML SOPN   Insulin Pen Needle (PEN NEEDLES) 32G X 4 MM MISC   Other Relevant Orders   Hemoglobin A1c   Generalized anxiety disorder    Symptoms reportedly primarily related to working full time, in school, hormonal changes, and new baby.  She feels that these stressors will decrease significantly now that she has finished school.  Discussed the option to taper off of sertraline with 2 week taper to 25mg  then stop if well tolerated.  She will plan to begin this after the first of the year.  Discussed if symptoms return, we can always restart medication.       BMI 40.0-44.9, adult ([place]) -  Primary    Weight gain with significant difficulty losing weight despite diet, exercise, nutritionist, counting macro/micros, etc.  Has tried weight watchers, Keto diet, and previously Contrave which she was unable to tolerate.  Appx 50lb weight gain since starting hormonal therapy for IVF about 3 years ago. Unable to loose and keep the weight off.  Discussed options for medical  treatment including GLP-1 therapy. She is interested in trying this.  Will send script for Ozempic for CV risk reduction in setting of elevated BMI, HLD, and Hx of HTN in recent pregnancy.  Will monitor A1c and insulin levels today.  F/U in 6 weeks to monitor progression.  Repeat labs in 3 months.       Relevant Medications   Semaglutide,0.25 or 0.5MG /DOS, (OZEMPIC, 0.25 OR 0.5 MG/DOSE,) 2 MG/1.5ML SOPN   Insulin Pen Needle (PEN NEEDLES) 32G X 4 MM MISC   Other Relevant Orders   Hemoglobin A1c   Insulin, Free and Total   Mixed hyperlipidemia    Hx of HLD- not currently on statin therapy.  Combined with elevated BMI and hx of HTN during pregnancy, I feel that management with GLP-1 is warranted to help with CV protection.  Will send today for Ozempic.  Discussed administration and follow-up as well as warnings and precautions.  We will plan to f/u in 6 weeks to see how she is doing on the medication.       Relevant Medications   Semaglutide,0.25 or 0.5MG /DOS, (OZEMPIC, 0.25 OR 0.5 MG/DOSE,) 2 MG/1.5ML SOPN   Insulin Pen Needle (PEN NEEDLES) 32G X 4 MM MISC   Other Relevant Orders   Hemoglobin A1c   Insulin, Free and Total   Other Visit Diagnoses     Encounter for medical examination to establish care            Return in about 6 weeks (around 02/12/2021) for Virtual for weight loss.      Barton Want, Coralee Pesa, NP, DNP, AGNP-C Primary Care & Sports Medicine at [place]

## 2021-01-01 NOTE — Assessment & Plan Note (Signed)
Hx of HLD- not currently on statin therapy.  Combined with elevated BMI and hx of HTN during pregnancy, I feel that management with GLP-1 is warranted to help with CV protection.  Will send today for Ozempic.  Discussed administration and follow-up as well as warnings and precautions.  We will plan to f/u in 6 weeks to see how she is doing on the medication.

## 2021-01-01 NOTE — Assessment & Plan Note (Signed)
Symptoms reportedly primarily related to working full time, in school, hormonal changes, and new baby.  She feels that these stressors will decrease significantly now that she has finished school.  Discussed the option to taper off of sertraline with 2 week taper to 25mg  then stop if well tolerated.  She will plan to begin this after the first of the year.  Discussed if symptoms return, we can always restart medication.

## 2021-01-06 LAB — HEMOGLOBIN A1C
Est. average glucose Bld gHb Est-mCnc: 108 mg/dL
Hgb A1c MFr Bld: 5.4 % (ref 4.8–5.6)

## 2021-01-06 LAB — INSULIN, FREE AND TOTAL
Free Insulin: 4.1 uU/mL
Total Insulin: 4.1 uU/mL

## 2021-01-08 ENCOUNTER — Other Ambulatory Visit (HOSPITAL_COMMUNITY): Payer: Self-pay

## 2021-01-08 ENCOUNTER — Ambulatory Visit: Payer: Self-pay

## 2021-01-08 ENCOUNTER — Emergency Department: Admit: 2021-01-08 | Discharge status: Absent without leave | Payer: Self-pay

## 2021-01-08 ENCOUNTER — Emergency Department
Admission: EM | Admit: 2021-01-08 | Discharge: 2021-01-08 | Disposition: A | Discharge status: Home / Self Care | Payer: No Typology Code available for payment source | Source: Home / Self Care

## 2021-01-08 ENCOUNTER — Other Ambulatory Visit: Payer: Self-pay

## 2021-01-08 DIAGNOSIS — J029 Acute pharyngitis, unspecified: Secondary | ICD-10-CM

## 2021-01-08 DIAGNOSIS — J101 Influenza due to other identified influenza virus with other respiratory manifestations: Secondary | ICD-10-CM | POA: Diagnosis not present

## 2021-01-08 LAB — POC INFLUENZA A AND B ANTIGEN (URGENT CARE ONLY)
Influenza A Ag: NEGATIVE
Influenza B Ag: POSITIVE — AB

## 2021-01-08 LAB — POC SARS CORONAVIRUS 2 AG -  ED: SARS Coronavirus 2 Ag: NEGATIVE

## 2021-01-08 MED ORDER — AZITHROMYCIN 250 MG PO TABS
250.0000 mg | ORAL_TABLET | Freq: Every day | ORAL | 0 refills | Status: DC
  Filled 2021-01-08: qty 6, 5d supply, fill #0

## 2021-01-08 MED ORDER — OSELTAMIVIR PHOSPHATE 75 MG PO CAPS
75.0000 mg | ORAL_CAPSULE | Freq: Two times a day (BID) | ORAL | 0 refills | Status: DC
  Filled 2021-01-08: qty 10, 5d supply, fill #0

## 2021-01-08 NOTE — ED Triage Notes (Signed)
Pt c/o fatigue and chills that started Friday eve. Then developed sore throat and fever. Fever of 101.2 early this am. Advil cold and sinus prn. Possible flu exposure.

## 2021-01-08 NOTE — Discharge Instructions (Addendum)
Advised patient to take medication as directed with food to completion.  Encouraged patient to increase daily water intake while taking these medications. 

## 2021-01-08 NOTE — ED Provider Notes (Signed)
[facility]    CSN: [id number] Arrival date & time: 01/08/21  1016      History   Chief Complaint Chief Complaint  Patient presents with   Fever   Sore Throat   Nasal Congestion    HPI Deanna Gross is a 35 y.o. female.   HPI Pleasant 35 year old female presents with fatigue and chills that started 4 days ago then developed sore throat and fever.  Reports T-max at home 101.2 early this morning.  Patient reports possible flu exposure.  PMH significant for asthma, seasonal allergies and chronic infection of sinus.  Past Medical History:  Diagnosis Date   Anxiety    Asthma    exercise-induced; prn inhaler   Chronic infection of sinus 05/03/2015   Depression    on zoloft, doing well, cut dose.   Fibular avulsion fracture, ATFL sprain right ankle 11/16/2020   Headache(784.0)    sinus   History of abnormal cervical Pap smear 07/26/2019   History of MRSA infection 2008   left leg   HPV in female    Infection    UTI   Insect bites 08/30/2013   IT band syndrome 05/03/2015   NSVD (normal spontaneous vaginal delivery) 09/17/2018   Obesity (BMI 30-39.9) 05/03/2015   PIH (pregnancy induced hypertension), third trimester 09/16/2018   Preeclampsia, severe, third trimester 09/16/2018   Recurrent knee pain 05/03/2015   Seasonal allergies    Surgical wound, non healing 08/2013   left breast   Tendinitis, de Quervain's 05/08/2017   Uses condoms for contraception 05/23/2015   Vaginal Pap smear, abnormal     Patient Active Problem List   Diagnosis Date Noted   Anxiety 01/01/2021   Eczema 01/01/2021   BMI 40.0-44.9, adult (HCC) 01/01/2021   Mixed hyperlipidemia 01/01/2021   History of pre-eclampsia 07/26/2019   Generalized anxiety disorder 07/26/2019   HPV in female 05/22/2015   Allergic rhinitis 05/03/2015   Astigmatism 01/13/2012    Past Surgical History:  Procedure Laterality Date   BREAST CYST EXCISION Left 03/18/2013   Procedure: EXCISE CYST LEFT MEDIAL BREAST;   Surgeon: Velora Heckler, MD;  Location: [facility];  Service: General;  Laterality: Left;   MASS EXCISION Left 09/01/2013   Procedure: RE-EXCISION NON HEALING WOUND LEFT BREAST;  Surgeon: Velora Heckler, MD;  Location: [facility];  Service: General;  Laterality: Left;   TONSILLECTOMY     TONSILLECTOMY AND ADENOIDECTOMY     TYMPANOSTOMY TUBE PLACEMENT     x 3   WISDOM TOOTH EXTRACTION      OB History     Gravida  1   Para  1   Term  1   Preterm  0   AB  0   Living  1      SAB  0   IAB  0   Ectopic  0   Multiple  0   Live Births  1            Home Medications    Prior to Admission medications   Medication Sig Start Date End Date Taking? Authorizing Provider  azithromycin (ZITHROMAX) 250 MG tablet Take 1 tablet (250 mg total) by mouth daily. Take first 2 tablets together, then 1 every day until finished. 01/08/21  Yes Trevor Iha, FNP  oseltamivir (TAMIFLU) 75 MG capsule Take 1 capsule (75 mg total) by mouth every 12 (twelve) hours. 01/08/21  Yes Trevor Iha, FNP  albuterol (VENTOLIN HFA) 108 (90  Base) MCG/ACT inhaler Inhale 1-2 puffs into the lungs every 6 (six) hours as needed for wheezing or shortness of breath. 07/26/19   Sunnie Nielsen, DO  fexofenadine (ALLEGRA) 180 MG tablet Take 1 tablet (180 mg total) by mouth daily. 07/26/20   Sunnie Nielsen, DO  Insulin Pen Needle (PEN NEEDLES) 32G X 4 MM MISC Use one new needle per week with injection of Ozempic 01/01/21   Early, Sung Amabile, NP  meloxicam (MOBIC) 15 MG tablet Take 1 tablet by mouth with a meal each morning for 2 weeks and then take 1 tablet once daily as needed for pain. Patient not taking: Reported on 12/28/2020 11/16/20   Monica Becton, MD  Semaglutide,0.25 or 0.5MG /DOS, (OZEMPIC, 0.25 OR 0.5 MG/DOSE,) 2 MG/1.5ML SOPN Start with 0.25mg  injection into skin once weekly for 4 weeks THEN increase to 0.5mg  injection into skin once weekly. 01/01/21   Tollie Eth, NP   sertraline (ZOLOFT) 50 MG tablet TAKE 1 TABLET BY MOUTH AT BEDTIME. 07/26/20 07/26/21  Sunnie Nielsen, DO    Family History Family History  Problem Relation Age of Onset   Hyperlipidemia Father    Hypertension Father    Depression Father    Hypothyroidism Mother    Thyroid disease Mother    Cancer Maternal Grandfather        leukemia   Cancer Paternal Grandfather        non-hodgkins lymphoma   Stroke Maternal Grandmother     Social History Social History   Tobacco Use   Smoking status: Never   Smokeless tobacco: Never  Vaping Use   Vaping Use: Never used  Substance Use Topics   Alcohol use: Not Currently    Comment: rarely   Drug use: No     Allergies   Polytrim [polymyxin b-trimethoprim]   Review of Systems Review of Systems  Constitutional:  Positive for chills, fatigue and fever.  HENT:  Positive for sore throat.   All other systems reviewed and are negative.   Physical Exam Triage Vital Signs ED Triage Vitals [01/08/21 1138]  Enc Vitals Group     BP 139/82     Pulse Rate 89     Resp 18     Temp 98.1 F (36.7 C)     Temp Source Oral     SpO2 97 %     Weight      Height      Head Circumference      Peak Flow      Pain Score 8     Pain Loc      Pain Edu?      Excl. in GC?    No data found.  Updated Vital Signs BP 139/82 (BP Location: Left Arm)    Pulse 89    Temp 98.1 F (36.7 C) (Oral)    Resp 18    SpO2 97%   Physical Exam Vitals and nursing note reviewed.  Constitutional:      Appearance: She is well-developed. She is obese.  HENT:     Head: Normocephalic and atraumatic.     Right Ear: Tympanic membrane and ear canal normal. Drainage present.     Left Ear: Tympanic membrane and ear canal normal.     Mouth/Throat:     Mouth: Mucous membranes are moist.     Pharynx: Oropharynx is clear. Uvula midline. Posterior oropharyngeal erythema and uvula swelling present.  Eyes:     Conjunctiva/sclera: Conjunctivae normal.     Pupils:  Pupils are equal, round, and reactive to light.  Cardiovascular:     Rate and Rhythm: Normal rate and regular rhythm.     Heart sounds: Normal heart sounds.  Pulmonary:     Effort: Pulmonary effort is normal.     Breath sounds: Normal breath sounds.  Musculoskeletal:     Cervical back: Normal range of motion and neck supple.  Lymphadenopathy:     Cervical: Cervical adenopathy present.  Skin:    General: Skin is warm and dry.  Neurological:     General: No focal deficit present.     Mental Status: She is alert and oriented to person, place, and time.     UC Treatments / Results  Labs (all labs ordered are listed, but only abnormal results are displayed) Labs Reviewed  POC INFLUENZA A AND B ANTIGEN (URGENT CARE ONLY) - Abnormal; Notable for the following components:      Result Value   Influenza B Ag Positive (*)    All other components within normal limits  POC SARS CORONAVIRUS 2 AG -  ED    EKG   Radiology No results found.  Procedures Procedures (including critical care time)  Medications Ordered in UC Medications - No data to display  Initial Impression / Assessment and Plan / UC Course  I have reviewed the triage vital signs and the nursing notes.  Pertinent labs & imaging results that were available during my care of the patient were reviewed by me and considered in my medical decision making (see chart for details).     MDM: 1. Influenza B-Rx'd Tamiflu; 2.  Pharyngitis-Rx'd Zithromax. Advised patient to take medication as directed with food to completion.  Encouraged patient to increase daily water intake while taking these medications.  Work note provided prior to discharge.  Patient discharged home, hemodynamically stable. Final Clinical Impressions(s) / UC Diagnoses   Final diagnoses:  Pharyngitis, unspecified etiology  Influenza B     Discharge Instructions      Advised patient to take medication as directed with food to completion.  Encouraged  patient to increase daily water intake while taking these medications.     ED Prescriptions     Medication Sig Dispense Auth. Provider   oseltamivir (TAMIFLU) 75 MG capsule Take 1 capsule (75 mg total) by mouth every 12 (twelve) hours. 10 capsule Trevor Iha, FNP   azithromycin (ZITHROMAX) 250 MG tablet Take 1 tablet (250 mg total) by mouth daily. Take first 2 tablets together, then 1 every day until finished. 6 tablet Trevor Iha, FNP      PDMP not reviewed this encounter.   Trevor Iha, FNP 01/08/21 1250

## 2021-01-14 ENCOUNTER — Telehealth: Payer: No Typology Code available for payment source | Admitting: Family Medicine

## 2021-01-14 ENCOUNTER — Encounter (HOSPITAL_BASED_OUTPATIENT_CLINIC_OR_DEPARTMENT_OTHER): Payer: Self-pay | Admitting: Nurse Practitioner

## 2021-01-14 ENCOUNTER — Other Ambulatory Visit (HOSPITAL_COMMUNITY): Payer: Self-pay

## 2021-01-14 DIAGNOSIS — B379 Candidiasis, unspecified: Secondary | ICD-10-CM

## 2021-01-14 DIAGNOSIS — T3695XA Adverse effect of unspecified systemic antibiotic, initial encounter: Secondary | ICD-10-CM

## 2021-01-14 MED ORDER — FLUCONAZOLE 150 MG PO TABS
ORAL_TABLET | ORAL | 0 refills | Status: DC
  Filled 2021-01-14: qty 2, 3d supply, fill #0

## 2021-01-14 MED ORDER — ONDANSETRON HCL 4 MG PO TABS
4.0000 mg | ORAL_TABLET | Freq: Three times a day (TID) | ORAL | 1 refills | Status: DC | PRN
  Filled 2021-01-14: qty 20, 7d supply, fill #0
  Filled 2021-06-04: qty 20, 7d supply, fill #1

## 2021-01-14 NOTE — Progress Notes (Signed)

## 2021-01-25 ENCOUNTER — Telehealth (INDEPENDENT_AMBULATORY_CARE_PROVIDER_SITE_OTHER): Payer: No Typology Code available for payment source | Admitting: Sports Medicine

## 2021-01-25 DIAGNOSIS — S99911D Unspecified injury of right ankle, subsequent encounter: Secondary | ICD-10-CM

## 2021-01-25 NOTE — Assessment & Plan Note (Signed)
Deanna Gross returns, she is doing really well, she is now approximately 10 weeks post fibular avulsion fracture, doing well, almost no pain, no need for the brace. I have recommended during strenuous activities to keep her brace on, particularly hiking, running, she can use her Peloton with the brace on for the next month and then discontinue. Continue conditioning exercises for both ankles every other day indefinitely and return to see me as needed.

## 2021-01-25 NOTE — Progress Notes (Signed)
° °  Virtual Visit via Telephone   I connected with  Deanna Gross  on 01/25/21 by telephone/telehealth and verified that I am speaking with the correct person using two identifiers.   I discussed the limitations, risks, security and privacy concerns of performing an evaluation and management service by telephone, including the higher likelihood of inaccurate diagnosis and treatment, and the availability of in person appointments.  We also discussed the likely need of an additional face to face encounter for complete and high quality delivery of care.  I also discussed with the patient that there may be a patient responsible charge related to this service. The patient expressed understanding and wishes to proceed.  Provider location is in medical facility. Patient location is at their home, different from provider location. People involved in care of the patient during this telehealth encounter were myself, my nurse/medical assistant, and my front office/scheduling team member.  Review of Systems: No fevers, chills, night sweats, weight loss, chest pain, or shortness of breath.   Objective Findings:    General: Speaking full sentences, no audible heavy breathing.  Sounds alert and appropriately interactive.    Independent interpretation of tests performed by another provider:   None.  Brief History, Exam, Impression, and Recommendations:    Fibular avulsion fracture, ATFL sprain right ankle Deanna Gross returns, she is doing really well, she is now approximately 10 weeks post fibular avulsion fracture, doing well, almost no pain, no need for the brace. I have recommended during strenuous activities to keep her brace on, particularly hiking, running, she can use her Peloton with the brace on for the next month and then discontinue. Continue conditioning exercises for both ankles every other day indefinitely and return to see me as needed.   I discussed the above assessment and treatment plan  with the patient. The patient was provided an opportunity to ask questions and all were answered. The patient agreed with the plan and demonstrated an understanding of the instructions.   The patient was advised to call back or seek an in-person evaluation if the symptoms worsen or if the condition fails to improve as anticipated.   I provided 30 minutes of verbal and non-verbal time during this encounter date, time was needed to gather information, review chart, records, communicate/coordinate with staff remotely, as well as complete documentation.   ___________________________________________ Ihor Austin. Benjamin Stain, M.D., ABFM., CAQSM. Primary Care and Sports Medicine [facility]  Adjunct Professor of Family Medicine  [employer]

## 2021-01-25 NOTE — Progress Notes (Signed)
Patient reports 75% better since last visit. Patient reports she has not been wearing the brace for the past week.

## 2021-02-07 ENCOUNTER — Other Ambulatory Visit (HOSPITAL_COMMUNITY): Payer: Self-pay

## 2021-02-12 ENCOUNTER — Encounter (HOSPITAL_BASED_OUTPATIENT_CLINIC_OR_DEPARTMENT_OTHER): Payer: Self-pay | Admitting: Nurse Practitioner

## 2021-02-12 ENCOUNTER — Other Ambulatory Visit: Payer: Self-pay

## 2021-02-12 ENCOUNTER — Telehealth (INDEPENDENT_AMBULATORY_CARE_PROVIDER_SITE_OTHER): Payer: No Typology Code available for payment source | Admitting: Nurse Practitioner

## 2021-02-12 ENCOUNTER — Other Ambulatory Visit (HOSPITAL_BASED_OUTPATIENT_CLINIC_OR_DEPARTMENT_OTHER): Payer: Self-pay

## 2021-02-12 DIAGNOSIS — E782 Mixed hyperlipidemia: Secondary | ICD-10-CM | POA: Diagnosis not present

## 2021-02-12 DIAGNOSIS — Z6841 Body Mass Index (BMI) 40.0 and over, adult: Secondary | ICD-10-CM | POA: Diagnosis not present

## 2021-02-12 MED ORDER — SEMAGLUTIDE (1 MG/DOSE) 4 MG/3ML ~~LOC~~ SOPN
1.0000 mg | PEN_INJECTOR | SUBCUTANEOUS | 1 refills | Status: DC
  Filled 2021-02-12: qty 9, 84d supply, fill #0

## 2021-02-12 NOTE — Assessment & Plan Note (Signed)
We will obtain labs at next visit.

## 2021-02-12 NOTE — Progress Notes (Signed)
Virtual Visit Encounter  telephone visit.   I connected with  [name] on 02/12/21 at  8:50 AM EST by secure audio and/or video enabled telemedicine application. I verified that I am speaking with the correct person using two identifiers.   I introduced myself as a Publishing rights manager with the practice. The limitations of evaluation and management by telemedicine discussed with the patient and the availability of in person appointments. The patient expressed verbal understanding and consent to proceed.  Participating parties in this visit include: Myself and patient  The patient is: Patient Location: Home I am: Provider Location: Office/Clinic Subjective:    CC and HPI: Deanna Gross is a 36 y.o. year old female presenting for follow up of weight management with Ozempic.  She tells me she has been doing well with the Ozempic.  At this time she has lost a total of 7.5 pounds.  Initially she was experiencing some nausea with dosing but transition to the injection site from her abdomen to her thigh and reports that the nausea was improved however the weight loss did seem to go down.  She has since started giving the injection in the abdomen again and reports that the nausea is manageable at this time.  She denies any other side effects or concerning symptoms with the medication.  She does report that she feels better and is not excited about her weight loss results.  Past medical history, Surgical history, Family history not pertinant except as noted below, Social history, Allergies, and medications have been entered into the medical record, reviewed, and corrections made.   Review of Systems:  All review of systems negative except what is listed in the HPI  Objective:    Alert and oriented x 4 Speaking in clear sentences with no shortness of breath. No distress.  Impression and Recommendations:    Problem List Items Addressed This Visit   None   orders and follow up as documented  in EMR I discussed the assessment and treatment plan with the patient. The patient was provided an opportunity to ask questions and all were answered. The patient agreed with the plan and demonstrated an understanding of the instructions.   The patient was advised to call back or seek an in-person evaluation if the symptoms worsen or if the condition fails to improve as anticipated.  Follow-Up: in 3 months  I provided 16 minutes of non-face-to-face interaction with this non face-to-face encounter including intake, same-day documentation, and chart review.   Tollie Eth, NP , DNP, AGNP-c [facility] Primary Care & Sports Medicine at [facility] [phone] (fax)

## 2021-02-12 NOTE — Assessment & Plan Note (Signed)
Patient doing very well on medication regimen with a total of 7.5 pound weight loss in the past 6 weeks. Recommend continuation of current medication.  She would be finished with the 0.5 mg dose in approximately 1 month and will then plan to restart on the 1 mg dosage.  At this time we will plan to keep her on the 1 mg dose as for a few months unless she begins to show a decline in effectiveness from this. We will plan upon follow-up in approximately 3 to 4 months to evaluate the effectiveness of the medication and determine if any changes need to be made. We will plan to obtain labs at next visit for further evaluation.

## 2021-02-28 ENCOUNTER — Other Ambulatory Visit (HOSPITAL_COMMUNITY): Payer: Self-pay

## 2021-03-02 ENCOUNTER — Telehealth: Payer: No Typology Code available for payment source | Admitting: Physician Assistant

## 2021-03-02 DIAGNOSIS — B9689 Other specified bacterial agents as the cause of diseases classified elsewhere: Secondary | ICD-10-CM

## 2021-03-02 DIAGNOSIS — J019 Acute sinusitis, unspecified: Secondary | ICD-10-CM

## 2021-03-02 MED ORDER — AMOXICILLIN-POT CLAVULANATE 875-125 MG PO TABS: 1.0000 | ORAL_TABLET | Freq: Two times a day (BID) | ORAL | 0 refills | Status: DC

## 2021-03-02 NOTE — Progress Notes (Signed)

## 2021-03-02 NOTE — Progress Notes (Signed)
I have spent 5 minutes in review of e-visit questionnaire, review and updating patient chart, medical decision making and response to patient.   Davisha Linthicum Cody Elvenia Godden, PA-C    

## 2021-03-06 ENCOUNTER — Other Ambulatory Visit (HOSPITAL_COMMUNITY): Payer: Self-pay

## 2021-03-06 ENCOUNTER — Ambulatory Visit (INDEPENDENT_AMBULATORY_CARE_PROVIDER_SITE_OTHER): Payer: No Typology Code available for payment source | Admitting: Nurse Practitioner

## 2021-03-06 ENCOUNTER — Other Ambulatory Visit: Payer: Self-pay

## 2021-03-06 DIAGNOSIS — J189 Pneumonia, unspecified organism: Secondary | ICD-10-CM

## 2021-03-06 MED ORDER — HYDROCODONE BIT-HOMATROP MBR 5-1.5 MG/5ML PO SOLN
5.0000 mL | Freq: Three times a day (TID) | ORAL | 0 refills | Status: DC | PRN
  Filled 2021-03-06: qty 120, 8d supply, fill #0

## 2021-03-06 MED ORDER — FLUCONAZOLE 150 MG PO TABS
ORAL_TABLET | ORAL | 2 refills | Status: DC
  Filled 2021-03-06: qty 2, 3d supply, fill #0

## 2021-03-06 MED ORDER — LEVOFLOXACIN 500 MG PO TABS
500.0000 mg | ORAL_TABLET | Freq: Every day | ORAL | 0 refills | Status: AC
  Filled 2021-03-06: qty 7, 7d supply, fill #0

## 2021-03-06 NOTE — Progress Notes (Signed)
Virtual Visit Encounter  telephone visit.   I connected with  Deanna Gross on 03/08/21 at 11:10 AM EST by secure audio and/or video enabled telemedicine application. I verified that I am speaking with the correct person using two identifiers.   I introduced myself as a Publishing rights manager with the practice. The limitations of evaluation and management by telemedicine discussed with the patient and the availability of in person appointments. The patient expressed verbal understanding and consent to proceed.  Participating parties in this visit include: Myself and patient  The patient is: Patient Location: Home I am: Provider Location: Office/Clinic Subjective:    CC and HPI: Deanna Gross is a 36 y.o. year old female presenting for evaluation of productive cough, weakness, shortness of breath, sinus congestion that have been present for approximately 3 weeks. She was evaluated with an e-visit on 02/04 and prescribed Augmentin. She completed the entire dose of medication and reports that she has not had any improvement in her symptoms. Her cough and chest congestion have substantially worsened. She has not had a fever since Monday (currently Wednesday), but feels very poor in general.   Past medical history, Surgical history, Family history not pertinant except as noted below, Social history, Allergies, and medications have been entered into the medical record, reviewed, and corrections made.   Review of Systems:  All review of systems negative except what is listed in the HPI  Objective:    Alert and oriented x 4 Speaking in clear sentences with no shortness of breath. She is audibly congested.  Cough is present.  No distress.  Impression and Recommendations:    Problem List Items Addressed This Visit     Community acquired pneumonia - Primary    Given current symptoms and length of time patient has been ill, strongly suspect transition to community-acquired pneumonia from initial  sinus infection. Discussed with patient option of trial of additional round of Augmentin in addition to azithromycin versus transition to stronger medication.  Given that the patient had no change in symptoms with the Augmentin at this time joint decision was made to begin treatment with Levaquin. Patient is presenting with significant cough and audible congestion. If symptoms fail to improve with treatment recommend chest x-ray for further evaluation. No alarm symptoms present today that would warrant emergent hospital evaluation. Discussed warnings and risks with use of Levaquin with patient.  She expresses understanding. We will plan to follow-up before the weekend with a phone call to ensure that she has had improvement of symptoms.      Relevant Medications   levofloxacin (LEVAQUIN) 500 MG tablet   HYDROcodone bit-homatropine (HYCODAN) 5-1.5 MG/5ML syrup   fluconazole (DIFLUCAN) 150 MG tablet    orders and follow up as documented in EMR I discussed the assessment and treatment plan with the patient. The patient was provided an opportunity to ask questions and all were answered. The patient agreed with the plan and demonstrated an understanding of the instructions.   The patient was advised to call back or seek an in-person evaluation if the symptoms worsen or if the condition fails to improve as anticipated.  Follow-Up: in a few days  I provided 17 minutes of non-face-to-face interaction with this non face-to-face encounter including intake, same-day documentation, and chart review.   Tollie Eth, NP , DNP, AGNP-c [facility] Primary Care & Sports Medicine at [facility] [phone] (fax)

## 2021-03-08 ENCOUNTER — Encounter (HOSPITAL_BASED_OUTPATIENT_CLINIC_OR_DEPARTMENT_OTHER): Payer: Self-pay | Admitting: Nurse Practitioner

## 2021-03-08 ENCOUNTER — Ambulatory Visit (INDEPENDENT_AMBULATORY_CARE_PROVIDER_SITE_OTHER): Payer: No Typology Code available for payment source | Admitting: Nurse Practitioner

## 2021-03-08 ENCOUNTER — Other Ambulatory Visit: Payer: Self-pay

## 2021-03-08 DIAGNOSIS — J189 Pneumonia, unspecified organism: Secondary | ICD-10-CM | POA: Diagnosis not present

## 2021-03-08 HISTORY — DX: Pneumonia, unspecified organism: J18.9

## 2021-03-08 NOTE — Assessment & Plan Note (Addendum)
Significant improvement of symptoms with Levaquin. No alarm symptoms present today.  Patient appears to be recovering well and sounds significantly better. Recommend continue to finish antibiotic therapy and monitor for new or worsening symptoms and report immediately.

## 2021-03-08 NOTE — Progress Notes (Signed)
Virtual Visit Encounter telephone visit.   I connected with  Deanna Gross on 03/08/21 at 11:10 AM EST by secure audio and/or video enabled telemedicine application. I verified that I am speaking with the correct person using two identifiers.   I introduced myself as a Designer, jewellery with the practice. The limitations of evaluation and management by telemedicine discussed with the patient and the availability of in person appointments. The patient expressed verbal understanding and consent to proceed.  Participating parties in this visit include: Myself and patient  The patient is: Patient Location: Home I am: Provider Location: Office/Clinic Subjective:    CC and HPI: Deanna Gross is a 36 y.o. year old female presenting for follow up of CAP. Patient was initially seen 2 days ago for worsening cough, congestion, fatigue, generalized malaise that have been present for greater than 3 weeks.  She had failed initial antibiotic therapy with Augmentin with no improvement of symptoms.  She was started on Levaquin at that time. Patient reports today she is already had significant improvement in her symptoms and she is feeling much better.  She reports her cough has improved as well as her fatigue and weakness.  She has not had any additional fevers or chills.  Past medical history, Surgical history, Family history not pertinant except as noted below, Social history, Allergies, and medications have been entered into the medical record, reviewed, and corrections made.   Review of Systems:  All review of systems negative except what is listed in the HPI  Objective:    Alert and oriented x 4 Speaking in clear sentences with no shortness of breath. No distress.  Impression and Recommendations:    Problem List Items Addressed This Visit     Community acquired pneumonia - Primary    Significant improvement of symptoms with Levaquin. No alarm symptoms present today.  Patient appears to be  recovering well and sounds significantly better. Recommend continue to finish antibiotic therapy and monitor for new or worsening symptoms and report immediately.         current treatment plan is effective, no change in therapy I discussed the assessment and treatment plan with the patient. The patient was provided an opportunity to ask questions and all were answered. The patient agreed with the plan and demonstrated an understanding of the instructions.   The patient was advised to call back or seek an in-person evaluation if the symptoms worsen or if the condition fails to improve as anticipated.  Follow-Up: prn  I provided 12 minutes of non-face-to-face interaction with this non face-to-face encounter including intake, same-day documentation, and chart review.   Orma Render, NP , DNP, AGNP-c [place] at [facility] [phone] (fax)

## 2021-03-08 NOTE — Assessment & Plan Note (Signed)
Given current symptoms and length of time patient has been ill, strongly suspect transition to community-acquired pneumonia from initial sinus infection. Discussed with patient option of trial of additional round of Augmentin in addition to azithromycin versus transition to stronger medication.  Given that the patient had no change in symptoms with the Augmentin at this time joint decision was made to begin treatment with Levaquin. Patient is presenting with significant cough and audible congestion. If symptoms fail to improve with treatment recommend chest x-ray for further evaluation. No alarm symptoms present today that would warrant emergent hospital evaluation. Discussed warnings and risks with use of Levaquin with patient.  She expresses understanding. We will plan to follow-up before the weekend with a phone call to ensure that she has had improvement of symptoms.

## 2021-03-12 ENCOUNTER — Ambulatory Visit (INDEPENDENT_AMBULATORY_CARE_PROVIDER_SITE_OTHER): Payer: No Typology Code available for payment source | Admitting: Nurse Practitioner

## 2021-03-12 ENCOUNTER — Other Ambulatory Visit: Payer: Self-pay

## 2021-03-12 ENCOUNTER — Encounter (HOSPITAL_BASED_OUTPATIENT_CLINIC_OR_DEPARTMENT_OTHER): Payer: Self-pay | Admitting: Nurse Practitioner

## 2021-03-12 DIAGNOSIS — F411 Generalized anxiety disorder: Secondary | ICD-10-CM | POA: Diagnosis not present

## 2021-03-12 DIAGNOSIS — F5101 Primary insomnia: Secondary | ICD-10-CM | POA: Diagnosis not present

## 2021-03-12 MED ORDER — ALPRAZOLAM 0.5 MG PO TABS: 0.5000 mg | ORAL_TABLET | Freq: Every evening | ORAL | 0 refills | Status: DC | PRN

## 2021-03-12 MED ORDER — HYDROXYZINE HCL 50 MG PO TABS: 50.0000 mg | ORAL_TABLET | Freq: Every evening | ORAL | 3 refills | Status: DC | PRN

## 2021-03-12 NOTE — Progress Notes (Signed)
Virtual Visit Encounter  telephone visit.   I connected with  Deanna Gross on 03/15/21 at  3:50 PM EST by secure audio and/or video enabled telemedicine application. I verified that I am speaking with the correct person using two identifiers.   I introduced myself as a Publishing rights manager with the practice. The limitations of evaluation and management by telemedicine discussed with the patient and the availability of in person appointments. The patient expressed verbal understanding and consent to proceed.  Participating parties in this visit include: Myself and patient  The patient is: Patient Location: Home I am: Provider Location: Office/Clinic Subjective:    CC and HPI: Deanna Gross is a 36 y.o. year old female presenting for follow up of anxiety. Deanna Gross tells me that her anxiety has significantly worsened recently and is causing her significant distress.  She was recently sick for an extended period of time and then her child became ill which increased her physical and mental strain.  She reports she has not been sleeping well and is having a very difficult time relaxing her mind to fall asleep at night.  She is not waking feeling rested.  She also reports that her blood pressure has been elevated due to her anxiety.  She denies any SI/HI/self-harm.  Past medical history, Surgical history, Family history not pertinant except as noted below, Social history, Allergies, and medications have been entered into the medical record, reviewed, and corrections made.   Review of Systems:  All review of systems negative except what is listed in the HPI  Objective:    Alert and oriented x 4 Speaking in clear sentences with no shortness of breath.  Tearful throughout interview. No distress.  Impression and Recommendations:    Problem List Items Addressed This Visit     Generalized anxiety disorder - Primary    Recent worsening anxiety symptoms in the setting of acute illness.  She has been  working on tapering off of the sertraline as she was feeling better previously.  At this time symptoms are consistently worse at bedtime with inability to sleep.  I do feel that her lack of rest and increase stressors are significantly contributing to her symptoms.  Discussed with patient the option to trial hydroxyzine at bedtime for improved sleep which will hopefully improve her mood.  Also provided with as needed use of alprazolam for panic symptoms as they present. She will let me know if her symptoms worsen or fail to improve with current treatment.      Relevant Medications   hydrOXYzine (ATARAX) 50 MG tablet   ALPRAZolam (XANAX) 0.5 MG tablet   Other Visit Diagnoses     Primary insomnia           orders and follow up as documented in EMR I discussed the assessment and treatment plan with the patient. The patient was provided an opportunity to ask questions and all were answered. The patient agreed with the plan and demonstrated an understanding of the instructions.   The patient was advised to call back or seek an in-person evaluation if the symptoms worsen or if the condition fails to improve as anticipated.  Follow-Up: prn  I provided 20 minutes of non-face-to-face interaction with this non face-to-face encounter including intake, same-day documentation, and chart review.   Tollie Eth, NP , DNP, AGNP-c [facility] at [facility] [phone] (fax)

## 2021-03-13 ENCOUNTER — Telehealth (HOSPITAL_BASED_OUTPATIENT_CLINIC_OR_DEPARTMENT_OTHER): Payer: Self-pay

## 2021-03-13 NOTE — Telephone Encounter (Signed)
Deanna Gross spoke with patient yesterday 03/12/21.

## 2021-03-15 ENCOUNTER — Encounter (HOSPITAL_BASED_OUTPATIENT_CLINIC_OR_DEPARTMENT_OTHER): Payer: Self-pay | Admitting: Nurse Practitioner

## 2021-03-15 NOTE — Assessment & Plan Note (Signed)
Recent worsening anxiety symptoms in the setting of acute illness.  She has been working on tapering off of the sertraline as she was feeling better previously.  At this time symptoms are consistently worse at bedtime with inability to sleep.  I do feel that her lack of rest and increase stressors are significantly contributing to her symptoms.  Discussed with patient the option to trial hydroxyzine at bedtime for improved sleep which will hopefully improve her mood.  Also provided with as needed use of alprazolam for panic symptoms as they present. She will let me know if her symptoms worsen or fail to improve with current treatment.

## 2021-03-19 ENCOUNTER — Other Ambulatory Visit (HOSPITAL_BASED_OUTPATIENT_CLINIC_OR_DEPARTMENT_OTHER): Payer: Self-pay | Admitting: Nurse Practitioner

## 2021-03-19 ENCOUNTER — Encounter (HOSPITAL_BASED_OUTPATIENT_CLINIC_OR_DEPARTMENT_OTHER): Payer: Self-pay | Admitting: Nurse Practitioner

## 2021-03-19 DIAGNOSIS — F411 Generalized anxiety disorder: Secondary | ICD-10-CM

## 2021-03-19 MED ORDER — SERTRALINE HCL 50 MG PO TABS: 75.0000 mg | ORAL_TABLET | Freq: Every day | ORAL | 3 refills | Status: DC

## 2021-04-23 ENCOUNTER — Telehealth: Payer: No Typology Code available for payment source | Admitting: Physician Assistant

## 2021-04-23 DIAGNOSIS — R053 Chronic cough: Secondary | ICD-10-CM

## 2021-04-23 DIAGNOSIS — J189 Pneumonia, unspecified organism: Secondary | ICD-10-CM

## 2021-04-23 NOTE — Progress Notes (Signed)
Based on what you shared with me, I feel your condition warrants further evaluation and I recommend that you be seen in a face to face visit.  Since you have already been evaluated and started on a few different treatments for this (including Levaquin), we have to have you follow-up in-person for repeat evaluation and ongoing management per our telehealth protocols. I would reach out to your PCP office. If they cannot see you then I would recommend an Urgent Care evaluation for repeat lung examination and further management.  ?  ?NOTE: There will be NO CHARGE for this eVisit ?  ?If you are having a true medical emergency please call 911.   ?  ? For an urgent face to face visit, [facility] has six urgent care centers for your convenience:  ?  ? [facility] at [facility] ?Get Driving Directions ?[phone] ?[phone] ?[place], [place] [address] ?  ? [facility]) ?Get Driving Directions ?[phone] ?[address] ?[place], [place] [address] ? ?[facility]) ?Get Driving Directions ?[phone] ?[address] ?[place],  [place]  [address] ? ?[facility] ?Get Driving Directions ?[phone] ?[address] ?[place], [place] [address] ?  ?[facility] [place] ?Get Driving Directions  ?[phone] ?[address] ?[address] ?[place], [place] [address] ?  ? Urgent Care at [facility] ?Get Driving Directions ?[phone] ?[address] ?[place], [place] [address] ? ?Your MyChart E-visit questionnaire answers were reviewed by a board certified advanced clinical practitioner to complete your personal care plan based on your specific symptoms.  Thank you for using e-Visits. ?  ? ?

## 2021-04-25 ENCOUNTER — Ambulatory Visit
Admission: RE | Admit: 2021-04-25 | Discharge: 2021-04-25 | Disposition: A | Discharge status: Home / Self Care | Payer: No Typology Code available for payment source | Source: Ambulatory Visit | Attending: Nurse Practitioner | Admitting: Nurse Practitioner

## 2021-04-25 ENCOUNTER — Other Ambulatory Visit (HOSPITAL_COMMUNITY): Payer: Self-pay

## 2021-04-25 ENCOUNTER — Ambulatory Visit (INDEPENDENT_AMBULATORY_CARE_PROVIDER_SITE_OTHER): Payer: No Typology Code available for payment source | Admitting: Nurse Practitioner

## 2021-04-25 ENCOUNTER — Encounter (HOSPITAL_BASED_OUTPATIENT_CLINIC_OR_DEPARTMENT_OTHER): Payer: Self-pay | Admitting: Nurse Practitioner

## 2021-04-25 VITALS — BP 128/88 | HR 86 | Temp 98.3°F | Ht 63.0 in | Wt 228.4 lb

## 2021-04-25 DIAGNOSIS — R062 Wheezing: Secondary | ICD-10-CM | POA: Diagnosis not present

## 2021-04-25 DIAGNOSIS — J329 Chronic sinusitis, unspecified: Secondary | ICD-10-CM

## 2021-04-25 DIAGNOSIS — J189 Pneumonia, unspecified organism: Secondary | ICD-10-CM | POA: Diagnosis not present

## 2021-04-25 DIAGNOSIS — R052 Subacute cough: Secondary | ICD-10-CM

## 2021-04-25 DIAGNOSIS — B9689 Other specified bacterial agents as the cause of diseases classified elsewhere: Secondary | ICD-10-CM

## 2021-04-25 DIAGNOSIS — J309 Allergic rhinitis, unspecified: Secondary | ICD-10-CM

## 2021-04-25 LAB — CBC WITH DIFFERENTIAL/PLATELET
Basophils Absolute: 0 10*3/uL (ref 0.0–0.2)
Basos: 1 %
EOS (ABSOLUTE): 0.1 10*3/uL (ref 0.0–0.4)
Eos: 2 %
Hematocrit: 42.5 % (ref 34.0–46.6)
Hemoglobin: 13.9 g/dL (ref 11.1–15.9)
Immature Grans (Abs): 0 10*3/uL (ref 0.0–0.1)
Immature Granulocytes: 0 %
Lymphocytes Absolute: 2.3 10*3/uL (ref 0.7–3.1)
Lymphs: 28 %
MCH: 28.8 pg (ref 26.6–33.0)
MCHC: 32.7 g/dL (ref 31.5–35.7)
MCV: 88 fL (ref 79–97)
Monocytes Absolute: 0.5 10*3/uL (ref 0.1–0.9)
Monocytes: 6 %
Neutrophils Absolute: 5.3 10*3/uL (ref 1.4–7.0)
Neutrophils: 63 %
Platelets: 317 10*3/uL (ref 150–450)
RBC: 4.83 x10E6/uL (ref 3.77–5.28)
RDW: 13.6 % (ref 11.7–15.4)
WBC: 8.3 10*3/uL (ref 3.4–10.8)

## 2021-04-25 MED ORDER — MONTELUKAST SODIUM 10 MG PO TABS
10.0000 mg | ORAL_TABLET | Freq: Every day | ORAL | 3 refills | Status: DC
Start: 2021-04-25 — End: 2021-09-25
  Filled 2021-04-25: qty 90, 90d supply, fill #0

## 2021-04-25 MED ORDER — AZELASTINE HCL 0.1 % NA SOLN
2.0000 | Freq: Two times a day (BID) | NASAL | 12 refills | Status: DC
  Filled 2021-04-25: qty 30, 50d supply, fill #0
  Filled 2021-06-04: qty 30, 25d supply, fill #1

## 2021-04-25 MED ORDER — AZITHROMYCIN 250 MG PO TABS
ORAL_TABLET | ORAL | 0 refills | Status: DC
  Filled 2021-04-25: qty 6, 5d supply, fill #0

## 2021-04-25 MED ORDER — PREDNISONE 10 MG (21) PO TBPK
ORAL_TABLET | ORAL | 0 refills | Status: DC
  Filled 2021-04-25: qty 21, 6d supply, fill #0

## 2021-04-25 MED ORDER — CEFDINIR 300 MG PO CAPS
300.0000 mg | ORAL_CAPSULE | Freq: Two times a day (BID) | ORAL | 0 refills | Status: DC
  Filled 2021-04-25: qty 20, 10d supply, fill #0

## 2021-04-25 MED ORDER — TRELEGY ELLIPTA 200-62.5-25 MCG/ACT IN AEPB
1.0000 | INHALATION_SPRAY | Freq: Every day | RESPIRATORY_TRACT | 3 refills | Status: DC
  Filled 2021-04-25: qty 60, 30d supply, fill #0

## 2021-04-25 MED ORDER — FLUCONAZOLE 150 MG PO TABS
ORAL_TABLET | ORAL | 2 refills | Status: DC
  Filled 2021-04-25: qty 2, 3d supply, fill #0
  Filled 2021-07-10: qty 2, 3d supply, fill #1
  Filled 2021-08-12: qty 2, 3d supply, fill #2

## 2021-04-25 NOTE — Progress Notes (Addendum)
? ?Established Patient Office Visit ? ?Subjective:  ?Patient ID: Deanna Gross, female    DOB: [birth year]  Age: 36 y.o. MRN: [id number] ? ?CC:  ?Chief Complaint  ?Patient presents with  ? Cough  ? ? ?HPI ?Deanna Gross presents for follow-up for persistent cough following recent CAP diagnosis.  ?Deanna Gross reports her cough and congestion symptoms have continued despite antibiotic and supportive therapies. She endorses continuous harsh cough, shortness of breath, wheezing, chills, fatigue, right ear pain, and congestion. She has not been taking allergy medications, but feels that she may benefit from something to help with her asthma, which is normally exercise induced.  ?She is not running a fever, but does endorse purulent mucous production intermittently with cough. The cough is not allowing her to rest well. She reports that her daughter has the same symptoms. She also endorses sinus pain and pressure in both maxillary and frontal sinuses. She tells me overall she is not feeling terrible, but the cough is quite bothersome.  ? ? ?ROS ?Review of Systems ?All review of systems negative except what is listed in the HPI ? ?  ?Objective:  ?  ?Physical Exam ?Vitals and nursing note reviewed.  ?Constitutional:   ?   Appearance: She is ill-appearing.  ?HENT:  ?   Head: Normocephalic.  ?   Right Ear: Tenderness present. A middle ear effusion is present.  ?   Left Ear: A middle ear effusion is present.  ?   Nose: Congestion and rhinorrhea present.  ?   Right Turbinates: Enlarged and swollen.  ?   Left Turbinates: Enlarged and swollen.  ?   Right Sinus: Maxillary sinus tenderness and frontal sinus tenderness present.  ?   Left Sinus: Maxillary sinus tenderness and frontal sinus tenderness present.  ?   Mouth/Throat:  ?   Mouth: Mucous membranes are moist.  ?   Pharynx: Oropharynx is clear. Uvula midline. Posterior oropharyngeal erythema present.  ?Eyes:  ?   Extraocular Movements: Extraocular movements intact.  ?    Conjunctiva/sclera: Conjunctivae normal.  ?   Pupils: Pupils are equal, round, and reactive to light.  ?Neck:  ?   Vascular: No carotid bruit.  ?Cardiovascular:  ?   Rate and Rhythm: Normal rate and regular rhythm.  ?   Pulses: Normal pulses.  ?   Heart sounds: Normal heart sounds.  ?Pulmonary:  ?   Effort: Pulmonary effort is normal.  ?   Breath sounds: Wheezing present.  ?Abdominal:  ?   General: Bowel sounds are normal.  ?   Palpations: Abdomen is soft.  ?Musculoskeletal:  ?   Cervical back: No tenderness.  ?Lymphadenopathy:  ?   Cervical: Cervical adenopathy present.  ?Skin: ?   General: Skin is warm and dry.  ?   Capillary Refill: Capillary refill takes less than 2 seconds.  ?Neurological:  ?   General: No focal deficit present.  ?   Mental Status: She is alert and oriented to person, place, and time.  ?Psychiatric:     ?   Mood and Affect: Mood normal.     ?   Behavior: Behavior normal.     ?   Thought Content: Thought content normal.     ?   Judgment: Judgment normal.  ? ? ?BP 128/88   Pulse 86   Temp 98.3 ?F (36.8 ?C)   Ht 5\' 3"  (1.6 m)   Wt 228 lb 6.4 oz (103.6 kg)   SpO2 97%   BMI 40.46  kg/m?  ?Wt Readings from Last 3 Encounters:  ?04/25/21 228 lb 6.4 oz (103.6 kg)  ?01/01/21 241 lb 12.8 oz (109.7 kg)  ?07/26/19 232 lb (105.2 kg)  ? ?  ?Assessment & Plan:  ? ?Problem List Items Addressed This Visit   ? ? Allergic rhinitis  ?  Allergic rhinitis present in the setting of suspected CAP and sinusitis. Symptoms are persistent despite recent therapy. Will add azelastine and montelukast to treatment today.  ?  ?  ? Relevant Medications  ? montelukast (SINGULAIR) 10 MG tablet  ? Community acquired pneumonia  ?  Suspect ongoing CAP given presentation and symptoms. She has been on antibiotic therapy, but her symptoms are persisting. Will trial additional antibiotic therapy with broad spectrum to see if this will help with symptoms. Also adding azelastine and montelukast for suspected asthma/allergy component  as well as Trellegy trial to see if this helps with wheezing and cough. Continue with albuterol. CXR today for further evaluation.  ?Plan to f/u Monday with virtual visit.  ?  ?  ? Relevant Medications  ? Fluticasone-Umeclidin-Vilant (TRELEGY ELLIPTA) 200-62.5-25 MCG/ACT AEPB  ? cefdinir (OMNICEF) 300 MG capsule  ? fluconazole (DIFLUCAN) 150 MG tablet  ? azelastine (ASTELIN) 0.1 % nasal spray  ? montelukast (SINGULAIR) 10 MG tablet  ? Other Relevant Orders  ? DG Chest 2 View (Completed)  ? CBC with Differential/Platelet (Completed)  ? Subacute cough - Primary  ? Relevant Medications  ? Fluticasone-Umeclidin-Vilant (TRELEGY ELLIPTA) 200-62.5-25 MCG/ACT AEPB  ? cefdinir (OMNICEF) 300 MG capsule  ? montelukast (SINGULAIR) 10 MG tablet  ? Other Relevant Orders  ? DG Chest 2 View (Completed)  ? CBC with Differential/Platelet (Completed)  ? ?Other Visit Diagnoses   ? ? Wheezing      ? Relevant Medications  ? Fluticasone-Umeclidin-Vilant (TRELEGY ELLIPTA) 200-62.5-25 MCG/ACT AEPB  ? montelukast (SINGULAIR) 10 MG tablet  ? Other Relevant Orders  ? DG Chest 2 View (Completed)  ? Bacterial sinusitis      ? Relevant Medications  ? Fluticasone-Umeclidin-Vilant (TRELEGY ELLIPTA) 200-62.5-25 MCG/ACT AEPB  ? cefdinir (OMNICEF) 300 MG capsule  ? fluconazole (DIFLUCAN) 150 MG tablet  ? azelastine (ASTELIN) 0.1 % nasal spray  ? Other Relevant Orders  ? DG Chest 2 View (Completed)  ? CBC with Differential/Platelet (Completed)  ? ?  ? ? ?Meds ordered this encounter  ?Medications  ? Fluticasone-Umeclidin-Vilant (TRELEGY ELLIPTA) 200-62.5-25 MCG/ACT AEPB  ?  Sig: Inhale 1 puff into the lungs daily.  ?  Dispense:  60 each  ?  Refill:  3  ? cefdinir (OMNICEF) 300 MG capsule  ?  Sig: Take 1 capsule (300 mg total) by mouth 2 (two) times daily.  ?  Dispense:  20 capsule  ?  Refill:  0  ? DISCONTD: azithromycin (ZITHROMAX) 250 MG tablet  ?  Sig: Take 2 tablets on day 1, then 1 tablet daily on days 2 through 5  ?  Dispense:  6 tablet  ?   Refill:  0  ? DISCONTD: predniSONE (STERAPRED UNI-PAK 21 TAB) 10 MG (21) TBPK tablet  ?  Sig: Taper as directed on packaging.  ?  Dispense:  21 tablet  ?  Refill:  0  ? fluconazole (DIFLUCAN) 150 MG tablet  ?  Sig: Take one tablet by mouth at the first sign of symptoms of yeast infection. If no resolution, repeat dose in 72 hours.  ?  Dispense:  2 tablet  ?  Refill:  2  ? azelastine (ASTELIN)  0.1 % nasal spray  ?  Sig: Place 2 sprays into both nostrils 2 (two) times daily as directed  ?  Dispense:  30 mL  ?  Refill:  12  ? montelukast (SINGULAIR) 10 MG tablet  ?  Sig: Take 1 tablet (10 mg total) by mouth at bedtime.  ?  Dispense:  90 tablet  ?  Refill:  3  ? ? ?Follow-up: Return if symptoms worsen or fail to improve.  ? ? ?Tollie EthSara E. Danner Paulding, NP ?

## 2021-04-25 NOTE — Patient Instructions (Signed)
It was a pleasure seeing you today. I hope your time spent with [place] was pleasant and helpful. Please let us know if there is anything we can do to improve the service you receive.  ? ?Today we discussed concerns with: ? ?Subacute cough ?I suspect your symptoms are related to post infectious inflammation, but I want to be sure that there is nothing more serious going on. ?I have ordered labs to check for signs of infection and a chest x-ray to look at the lungs to make sure that there is not anything concerning present.  ? ?Your physician recommends that you have an XRAY of your chest. X-rays are a type of radiation called electromagnetic waves. X-ray imaging creates pictures of the inside of your body. The images show the parts of your body in different shades of black and white. This is because different tissues absorb different amounts of radiation. Calcium in bones absorbs x-rays the most, so bones look white. Fat and other soft tissues absorb less and look gray. Air absorbs the least, so lungs look black ? ?   1. You may have this done at the [facility], located in the [place] on the 1st floor. ?   2. You do no have to have an appointment. ?   3. [address] ?       [place], [place] [address] ?       [phone] ?       Monday - Friday  8:00 am - 5:00 pm ?4. [facility] ? ?. ? ? ?We will start cefdinir and azithromycin with a prednisone taper.  ?I have also sent in Trellegy inhaler, azelastine, and Singulair to see if this helps.  ? ? ?Important Office Information ?Lab Results ?If labs were ordered, please note that you will see results through [facility] as soon as they come available from [facility].  ?It takes up to 5 business days for the results to be routed to me and for me to review them once all of the lab results have come through from [facility] I will make recommendations based on your results and send these through [facility] or someone from the office will  call you to discuss. If your labs are abnormal, we may contact you to schedule a visit to discuss the results and make recommendations.  ?If you have not heard from [place] within 5 business days or you have waited longer than a week and your lab results have not come through on [facility], please feel free to call the office or send a message through [facility] to follow-up on these labs.  ? ?Referrals ?If referrals were placed today, the office where the referral was sent will contact you either by phone or through [facility] to set up scheduling. Please note that it can take up to a week for the referral office to contact you. If you do not hear from them in a week, please contact the referral office directly to inquire about scheduling.  ? ?Condition Treated ?If your condition worsens or you begin to have new symptoms, please schedule a follow-up appointment for further evaluation. If you are not sure if an appointment is needed, you may call the office to leave a message for the nurse and someone will contact you with recommendations.  ?If you have an urgent or life threatening emergency, please do not call the office, but seek emergency evaluation by calling 911 or going to the nearest emergency room for  evaluation.  ? ?MyChart and Phone Calls ?Please do not use MyChart for urgent messages. It may take up to 3 business days for MyChart messages to be read by staff and if they are unable to handle the request, an additional 3 business days for them to be routed to me and for my response.  ?Messages sent to the provider through [facility] do not come directly to the provider, please allow time for these messages to be routed and for me to respond.  ?We get a large volume of MyChart messages daily and these are responded to in the order received.  ? ?For urgent messages, please call the office at [phone] and speak with the front office staff or leave a message on the line of my assistant for guidance.  ?We are seeing  patients from the hours of 8:00 am through 5:00 pm and calls directly to the nurse may not be answered immediately due to seeing patients, but your call will be returned as soon as possible.  ?Phone  messages received after 4:00 PM Monday through Thursday may not be returned until the following business day. Phone messages received after 11:00 AM on Friday may not be returned until Monday.  ? ?After Hours ?We share on call hours with providers from other offices. If you have an urgent need after hours that cannot wait until the next business day, please contact the on call provider by calling the office number. A nurse will speak with you and contact the provider if needed for recommendations.  ?If you have an urgent or life threatening emergency after hours, please do not call the on call provider, but seek emergency evaluation by calling 911 or going to the nearest emergency room for evaluation.  ? ?Paperwork ?All paperwork requires a minimum of 5 days to complete and return to you or the designated personnel. Please keep this in mind when bringing in forms or sending requests for paperwork completion to the office.  ?  ?

## 2021-04-29 ENCOUNTER — Encounter (HOSPITAL_BASED_OUTPATIENT_CLINIC_OR_DEPARTMENT_OTHER): Payer: Self-pay | Admitting: Nurse Practitioner

## 2021-04-29 ENCOUNTER — Other Ambulatory Visit (HOSPITAL_COMMUNITY): Payer: Self-pay

## 2021-04-29 ENCOUNTER — Ambulatory Visit (INDEPENDENT_AMBULATORY_CARE_PROVIDER_SITE_OTHER): Payer: No Typology Code available for payment source | Admitting: Nurse Practitioner

## 2021-04-29 DIAGNOSIS — Z6841 Body Mass Index (BMI) 40.0 and over, adult: Secondary | ICD-10-CM

## 2021-04-29 DIAGNOSIS — E782 Mixed hyperlipidemia: Secondary | ICD-10-CM

## 2021-04-29 DIAGNOSIS — R052 Subacute cough: Secondary | ICD-10-CM

## 2021-04-29 DIAGNOSIS — R062 Wheezing: Secondary | ICD-10-CM | POA: Diagnosis not present

## 2021-04-29 HISTORY — DX: Subacute cough: R05.2

## 2021-04-29 MED ORDER — SEMAGLUTIDE-WEIGHT MANAGEMENT 0.25 MG/0.5ML ~~LOC~~ SOAJ
0.2500 mg | SUBCUTANEOUS | 0 refills | Status: AC
Start: 2021-04-29 — End: 2021-06-20
  Filled 2021-04-29: qty 2, 28d supply, fill #0

## 2021-04-29 MED ORDER — SEMAGLUTIDE-WEIGHT MANAGEMENT 0.5 MG/0.5ML ~~LOC~~ SOAJ
0.5000 mg | SUBCUTANEOUS | 0 refills | Status: AC
  Filled 2021-04-29 – 2021-06-04 (×2): qty 2, 28d supply, fill #0

## 2021-04-29 MED ORDER — SEMAGLUTIDE-WEIGHT MANAGEMENT 1 MG/0.5ML ~~LOC~~ SOAJ
1.0000 mg | SUBCUTANEOUS | 3 refills | Status: DC
  Filled 2021-04-29: qty 2, fill #0
  Filled 2021-07-10: qty 2, 28d supply, fill #0
  Filled 2021-08-12: qty 2, 28d supply, fill #1

## 2021-04-29 NOTE — Patient Instructions (Signed)
Continue with montelukast and Trellegy inhaler. Add Xyzal for allergy component.  ?Will send referral to pulmonology for further evaluation in event symptoms do not improve.  ? ?Follow-up if any new symptoms begin ? ?Change Ozempic to [facility].  ?Will start the 0.25mg  dose x 4 weeks then increase to 0.5mg  dose for 4 weeks then increase to 1mg  dose. We will see how you are doing on 1mg  and determine if increase is needed.  ?Continue to make changes to diet and increase activity.  ? ?Follow-up in 3 months for weight check and management.  ?

## 2021-04-29 NOTE — Assessment & Plan Note (Signed)
BMI >40 with HLD present.  ?Excellent results with semaglutide- 20lb weight loss. Insurance no longer covering [place], therefore will switch to [facility] and begin starter dosing.  ?Weight and CV benefit for patient given co-morbid conditions.  ?Recommend f/u in 3 months for further evaluation or sooner if needed.  ?

## 2021-04-29 NOTE — Assessment & Plan Note (Signed)
Persistent post infectious cough despite antibiotic therapy, oral steroids, inhaled steroids, albuterol, and montelukast. Symptoms worse when going outside does increase suspicion of allergy mediated cough.  ?Recommend xyzal at bedtime in addition to montelukast to see if this is helpful to control allergy symptoms.  ?Will send referral to pulmonology for further evaluation given the length of time symptoms have been present.  ?Recommend continue with inhalers for time being.  ?No alarm sx present. Monitor for new or worsening sx and notify immediately.  ?

## 2021-04-29 NOTE — Progress Notes (Signed)
Virtual Visit Encounter telephone visit. ? ? ?I connected with  Deanna Gross on 04/29/21 at  9:30 AM EDT by secure audio telemedicine application. I verified that I am speaking with the correct person using two identifiers. ?  ?I introduced myself as a Publishing rights manager with the practice. The limitations of evaluation and management by telemedicine discussed with the patient and the availability of in person appointments. The patient expressed verbal understanding and consent to proceed. ? ?Participating parties in this visit include: Myself and patient ? ?The patient is: Patient Location: Home ?I am: Provider Location: Office/Clinic ?Subjective:   ? ?CC and HPI: Deanna Gross is a 36 y.o. year old female presenting for follow up of weight loss and cough. ?Cough initially started with URI in Deanna Gross February. She was treated with antibiotics for sinusitis and suspected CAP based on symptoms and presentation. The cough continued and she was seen again on 04/25/2021 for continued cough and congestion. CXR performed with showed no concerning findings and lab work showed that normal white counts. She was treated again for sinusitis and provided with Trellegy to help with inflammation with suspect of post viral bronchitis.  ?She tells me she started the medications, but she is still not feeling much better. On Sunday she tells me she felt the best she had in months, but went outside Sunday afternoon and the symptoms returned.  ?She tells me the cough now feels like persistent irritation in the upper airway. She has started montelukast, but is concerned that this may be allergy mediated as she felt worse after being outside.  ?She has never seen pulmonology.  ?She endorses persistent cough, occasionally productive, congestion, and fatigue. She denies fevers, chills, sore throat.  ? ? ?Weight Management ?She initially started on Ozempic and was on the 0.5mg  dose in February when the cough started. She stopped the  medication as she felt she needed to focus on getting healthy and was shortly thereafter notified that the medication will no longer be covered by her insurance. At the time she stopped the medication she had lost 20lbs and was feeling great.  ?She has tried diet and exercise without weight loss success.  ?She would like to discuss alternatives that may be covered by insurance to help with weight.  ? ?Past medical history, Surgical history, Family history not pertinant except as noted below, Social history, Allergies, and medications have been entered into the medical record, reviewed, and corrections made.  ? ?Review of Systems:  ?All review of systems negative except what is listed in the HPI ? ?Objective:   ? ?Alert and oriented x 4 ?Speaking in clear sentences with no shortness of breath. ?No distress. ? ?Impression and Recommendations:   ? ?Problem List Items Addressed This Visit   ? ? BMI 40.0-44.9, adult (HCC)  ?  BMI >40 with HLD present.  ?Excellent results with semaglutide- 20lb weight loss. Insurance no longer covering Ozempic, therefore will switch to [facility] and begin starter dosing.  ?Weight and CV benefit for patient given co-morbid conditions.  ?Recommend f/u in 3 months for further evaluation or sooner if needed.  ?  ?  ? Relevant Medications  ? Semaglutide-Weight Management 0.25 MG/0.5ML SOAJ  ? Semaglutide-Weight Management 0.5 MG/0.5ML SOAJ (Start on 05/28/2021)  ? Semaglutide-Weight Management 1 MG/0.5ML SOAJ (Start on 06/26/2021)  ? Subacute cough - Primary  ?  Persistent post infectious cough despite antibiotic therapy, oral steroids, inhaled steroids, albuterol, and montelukast. Symptoms worse when going outside does increase  suspicion of allergy mediated cough.  ?Recommend xyzal at bedtime in addition to montelukast to see if this is helpful to control allergy symptoms.  ?Will send referral to pulmonology for further evaluation given the length of time symptoms have been present.  ?Recommend  continue with inhalers for time being.  ?No alarm sx present. Monitor for new or worsening sx and notify immediately.  ?  ?  ? Relevant Orders  ? Ambulatory referral to Pulmonology  ? Mixed hyperlipidemia  ? Relevant Medications  ? Semaglutide-Weight Management 0.25 MG/0.5ML SOAJ  ? Semaglutide-Weight Management 0.5 MG/0.5ML SOAJ (Start on 05/28/2021)  ? Semaglutide-Weight Management 1 MG/0.5ML SOAJ (Start on 06/26/2021)  ? ?Other Visit Diagnoses   ? ? Wheezing      ? Relevant Orders  ? Ambulatory referral to Pulmonology  ? ?  ? ?orders and follow up as documented in EMR ?I discussed the assessment and treatment plan with the patient. The patient was provided an opportunity to ask questions and all were answered. The patient agreed with the plan and demonstrated an understanding of the instructions. ?  ?The patient was advised to call back or seek an in-person evaluation if the symptoms worsen or if the condition fails to improve as anticipated. ? ?Follow-Up: in 3 months ? ?I provided 22 minutes of non-face-to-face interaction with this non face-to-face encounter including intake, same-day documentation, and chart review.  ? ?Tollie Eth, NP , DNP, AGNP-c ?[facility] ?Primary Care & Sports Medicine at [facility] ?[phone] ?[phone] (fax) ? ?

## 2021-04-30 NOTE — Assessment & Plan Note (Signed)
Allergic rhinitis present in the setting of suspected CAP and sinusitis. Symptoms are persistent despite recent therapy. Will add azelastine and montelukast to treatment today.  ?

## 2021-04-30 NOTE — Assessment & Plan Note (Signed)
Suspect ongoing CAP given presentation and symptoms. She has been on antibiotic therapy, but her symptoms are persisting. Will trial additional antibiotic therapy with broad spectrum to see if this will help with symptoms. Also adding azelastine and montelukast for suspected asthma/allergy component as well as Trellegy trial to see if this helps with wheezing and cough. Continue with albuterol. CXR today for further evaluation.  ?Plan to f/u Monday with virtual visit.  ?

## 2021-05-02 ENCOUNTER — Encounter (HOSPITAL_BASED_OUTPATIENT_CLINIC_OR_DEPARTMENT_OTHER): Payer: Self-pay | Admitting: Nurse Practitioner

## 2021-05-02 ENCOUNTER — Other Ambulatory Visit (HOSPITAL_COMMUNITY): Payer: Self-pay

## 2021-05-02 ENCOUNTER — Other Ambulatory Visit (HOSPITAL_BASED_OUTPATIENT_CLINIC_OR_DEPARTMENT_OTHER): Payer: Self-pay

## 2021-05-02 DIAGNOSIS — F419 Anxiety disorder, unspecified: Secondary | ICD-10-CM

## 2021-05-02 DIAGNOSIS — F411 Generalized anxiety disorder: Secondary | ICD-10-CM

## 2021-05-02 MED ORDER — SERTRALINE HCL 50 MG PO TABS: 75.0000 mg | ORAL_TABLET | Freq: Every day | ORAL | 0 refills | Status: DC

## 2021-05-04 ENCOUNTER — Other Ambulatory Visit (HOSPITAL_COMMUNITY): Payer: Self-pay

## 2021-05-07 ENCOUNTER — Other Ambulatory Visit (HOSPITAL_COMMUNITY): Payer: Self-pay

## 2021-05-07 MED ORDER — SERTRALINE HCL 50 MG PO TABS
50.0000 mg | ORAL_TABLET | Freq: Every day | ORAL | 0 refills | Status: DC
  Filled 2021-05-07: qty 90, 90d supply, fill #0

## 2021-05-07 MED ORDER — SERTRALINE HCL 25 MG PO TABS
25.0000 mg | ORAL_TABLET | Freq: Every day | ORAL | 0 refills | Status: DC
  Filled 2021-05-07: qty 90, 90d supply, fill #0

## 2021-05-08 ENCOUNTER — Other Ambulatory Visit (HOSPITAL_BASED_OUTPATIENT_CLINIC_OR_DEPARTMENT_OTHER): Payer: Self-pay | Admitting: Nurse Practitioner

## 2021-05-08 ENCOUNTER — Encounter (HOSPITAL_BASED_OUTPATIENT_CLINIC_OR_DEPARTMENT_OTHER): Payer: Self-pay | Admitting: Nurse Practitioner

## 2021-05-08 ENCOUNTER — Other Ambulatory Visit (HOSPITAL_COMMUNITY): Payer: Self-pay

## 2021-05-08 DIAGNOSIS — J309 Allergic rhinitis, unspecified: Secondary | ICD-10-CM

## 2021-05-08 DIAGNOSIS — J3489 Other specified disorders of nose and nasal sinuses: Secondary | ICD-10-CM

## 2021-05-13 ENCOUNTER — Other Ambulatory Visit (HOSPITAL_COMMUNITY): Payer: Self-pay

## 2021-05-14 ENCOUNTER — Telehealth (HOSPITAL_BASED_OUTPATIENT_CLINIC_OR_DEPARTMENT_OTHER): Payer: Self-pay | Admitting: Nurse Practitioner

## 2021-05-14 ENCOUNTER — Other Ambulatory Visit (HOSPITAL_COMMUNITY): Payer: Self-pay

## 2021-05-14 NOTE — Telephone Encounter (Signed)
Received a fax from in for PA for [facility]. ?Needing more in for for provider to fill out to send back. Paperwork is in provider yellow box. ?Please advise. ?

## 2021-05-16 ENCOUNTER — Other Ambulatory Visit (HOSPITAL_COMMUNITY): Payer: Self-pay

## 2021-05-16 ENCOUNTER — Encounter (HOSPITAL_BASED_OUTPATIENT_CLINIC_OR_DEPARTMENT_OTHER): Payer: Self-pay | Admitting: Nurse Practitioner

## 2021-05-18 ENCOUNTER — Other Ambulatory Visit (HOSPITAL_COMMUNITY): Payer: Self-pay

## 2021-05-21 ENCOUNTER — Telehealth (HOSPITAL_BASED_OUTPATIENT_CLINIC_OR_DEPARTMENT_OTHER): Payer: Self-pay | Admitting: Nurse Practitioner

## 2021-05-21 ENCOUNTER — Telehealth (HOSPITAL_BASED_OUTPATIENT_CLINIC_OR_DEPARTMENT_OTHER): Payer: Self-pay

## 2021-05-21 NOTE — Telephone Encounter (Signed)
CMA spoke with patient regarding PA approval and next steps ?

## 2021-05-21 NOTE — Telephone Encounter (Signed)
Received a fax from [employer] with Approval of Wegovy 0.25mg /0.20ml. ?Fax will be in providers yellow tray. ?Please advise. ?

## 2021-05-21 NOTE — Telephone Encounter (Signed)
Received fax from [employer] Deanna Gross was approved ref # [id number]. I called patient and made her aware.  ?

## 2021-05-22 ENCOUNTER — Other Ambulatory Visit (HOSPITAL_COMMUNITY): Payer: Self-pay

## 2021-05-24 ENCOUNTER — Other Ambulatory Visit (HOSPITAL_COMMUNITY): Payer: Self-pay

## 2021-05-27 ENCOUNTER — Institutional Professional Consult (permissible substitution): Payer: No Typology Code available for payment source | Admitting: Internal Medicine

## 2021-06-05 ENCOUNTER — Other Ambulatory Visit (HOSPITAL_COMMUNITY): Payer: Self-pay

## 2021-06-07 ENCOUNTER — Other Ambulatory Visit (HOSPITAL_COMMUNITY): Payer: Self-pay

## 2021-06-26 ENCOUNTER — Other Ambulatory Visit (HOSPITAL_BASED_OUTPATIENT_CLINIC_OR_DEPARTMENT_OTHER): Payer: Self-pay

## 2021-06-26 ENCOUNTER — Other Ambulatory Visit (HOSPITAL_COMMUNITY): Payer: Self-pay

## 2021-07-10 ENCOUNTER — Other Ambulatory Visit (HOSPITAL_BASED_OUTPATIENT_CLINIC_OR_DEPARTMENT_OTHER): Payer: Self-pay

## 2021-07-10 ENCOUNTER — Other Ambulatory Visit (HOSPITAL_COMMUNITY): Payer: Self-pay

## 2021-07-18 ENCOUNTER — Other Ambulatory Visit (HOSPITAL_COMMUNITY): Payer: Self-pay

## 2021-08-10 ENCOUNTER — Telehealth: Payer: No Typology Code available for payment source | Admitting: Family Medicine

## 2021-08-10 DIAGNOSIS — R591 Generalized enlarged lymph nodes: Secondary | ICD-10-CM | POA: Diagnosis not present

## 2021-08-10 DIAGNOSIS — J399 Disease of upper respiratory tract, unspecified: Secondary | ICD-10-CM | POA: Diagnosis not present

## 2021-08-10 MED ORDER — AMOXICILLIN 500 MG PO CAPS: 500.0000 mg | ORAL_CAPSULE | Freq: Three times a day (TID) | ORAL | 0 refills | Status: AC

## 2021-08-10 NOTE — Progress Notes (Signed)

## 2021-08-12 ENCOUNTER — Other Ambulatory Visit (HOSPITAL_COMMUNITY): Payer: Self-pay

## 2021-09-02 ENCOUNTER — Encounter (HOSPITAL_BASED_OUTPATIENT_CLINIC_OR_DEPARTMENT_OTHER): Payer: Self-pay | Admitting: Nurse Practitioner

## 2021-09-02 DIAGNOSIS — Z6841 Body Mass Index (BMI) 40.0 and over, adult: Secondary | ICD-10-CM

## 2021-09-02 DIAGNOSIS — F419 Anxiety disorder, unspecified: Secondary | ICD-10-CM

## 2021-09-02 DIAGNOSIS — E782 Mixed hyperlipidemia: Secondary | ICD-10-CM

## 2021-09-06 ENCOUNTER — Other Ambulatory Visit (HOSPITAL_COMMUNITY): Payer: Self-pay

## 2021-09-06 MED ORDER — WEGOVY 1.7 MG/0.75ML ~~LOC~~ SOAJ
1.7000 mg | SUBCUTANEOUS | 3 refills | Status: DC
  Filled 2021-09-06: qty 3, 28d supply, fill #0

## 2021-09-06 MED ORDER — ONDANSETRON HCL 4 MG PO TABS
4.0000 mg | ORAL_TABLET | Freq: Three times a day (TID) | ORAL | 3 refills | Status: DC | PRN
  Filled 2021-09-06: qty 20, 7d supply, fill #0
  Filled 2021-12-20: qty 20, 7d supply, fill #1

## 2021-09-06 MED ORDER — SERTRALINE HCL 50 MG PO TABS
50.0000 mg | ORAL_TABLET | Freq: Every day | ORAL | 3 refills | Status: DC
  Filled 2021-09-06: qty 90, 90d supply, fill #0
  Filled 2021-12-20: qty 90, 90d supply, fill #1

## 2021-09-24 ENCOUNTER — Other Ambulatory Visit (HOSPITAL_COMMUNITY): Payer: Self-pay

## 2021-09-24 ENCOUNTER — Encounter (HOSPITAL_BASED_OUTPATIENT_CLINIC_OR_DEPARTMENT_OTHER): Payer: Self-pay | Admitting: Nurse Practitioner

## 2021-09-24 ENCOUNTER — Ambulatory Visit (INDEPENDENT_AMBULATORY_CARE_PROVIDER_SITE_OTHER): Payer: No Typology Code available for payment source | Admitting: Nurse Practitioner

## 2021-09-24 VITALS — BP 105/74 | HR 83 | Ht 63.0 in | Wt 232.0 lb

## 2021-09-24 DIAGNOSIS — Z0000 Encounter for general adult medical examination without abnormal findings: Secondary | ICD-10-CM | POA: Diagnosis not present

## 2021-09-24 MED ORDER — ALPRAZOLAM 0.5 MG PO TABS
ORAL_TABLET | ORAL | 2 refills | Status: DC
  Filled 2021-09-24: qty 20, 20d supply, fill #0
  Filled 2021-12-20: qty 20, 20d supply, fill #1
  Filled 2022-03-19: qty 20, 20d supply, fill #2

## 2021-09-24 NOTE — Progress Notes (Signed)
BP 105/74   Pulse 83   Ht 5\' 3"  (1.6 m)   Wt 232 lb (105.2 kg)   SpO2 97%   BMI 41.10 kg/m    Subjective:    Patient ID: , female    DOB: Oct 09, 1985, 36 y.o.   MRN: [id number]  HPI: Deanna Gross is a 36 y.o. female presenting on 09/24/2021 for comprehensive medical examination.   Current medical concerns include:{Blank single:19197::"none","***"} Anxiety over flight today- in the past has taken   She reports regular vision exams q1-5y: {Blank single:19197::"yes","no"} She reports regular dental exams q 73m: {Blank single:19197::"yes","no"} Her diet consists of: {Blank single:19197::"***"} She endorses exercise and/or activity of: {Blank single:19197::"***"} She works in: {Blank single:19197::"***"}  She {Blank single:19197::"denies","endorses"} ETOH use ({Blank single:19197::"***"}) She {Blank single:19197::"denies","endorses"} nictoine use ({Blank single:19197::"***"}) She {Blank single:19197::"denies","endorses"} illegal substance use ({Blank single:19197::"***"})  She {ACTION; IS/IS NOT:21021397::"is"} having menstrual periods.  She  {Blank single:19197::"denies","endorses"} abnormal bleeding. She  {Blank single:19197::"denies","endorses"} menopausal symptoms.  She is {Misc; is/is not/not currently/has never been:13135::"is"} sexually active. She currently has {gen number 0-10:310397::"1"} sexual partners.  She {Blank single:19197::"denies","endorses"} concerns today about STI: testing ordered: {Blank single:19197::"yes","no"}  She {Blank single:19197::"endorses","denies"} concerns about skin changes today  She {Blank single:19197::"endorses","denies"} concerns about bowel changes today  She {Blank single:19197::"endorses","denies"} concerns about bladder changes today   Most Recent Depression Screen:     04/25/2021    8:10 AM 01/01/2021   12:07 PM 07/26/2019    1:50 PM 08/18/2016    4:10 PM 08/04/2016    4:25 PM  Depression screen PHQ 2/9  Decreased  Interest 0 0 0    Down, Depressed, Hopeless 0 0 0    PHQ - 2 Score 0 0 0    Altered sleeping  1 0    Tired, decreased energy  1 0    Change in appetite  0 0    Feeling bad or failure about yourself   0 0    Trouble concentrating  0 0    Moving slowly or fidgety/restless  0 0    Suicidal thoughts  0 0    PHQ-9 Score  2 0    Difficult doing work/chores  Not difficult at all Not difficult at all       Information is confidential and restricted. Go to Review Flowsheets to unlock data.   Most Recent Anxiety Screen:     01/01/2021   12:07 PM 07/26/2019    1:50 PM 08/04/2016    4:24 PM 07/07/2016    4:23 PM  GAD 7 : Generalized Anxiety Score  Nervous, Anxious, on Edge 2 1    Control/stop worrying 2 0    Worry too much - different things 2 0    Trouble relaxing 0 0    Restless 0 0    Easily annoyed or irritable 2 1    Afraid - awful might happen 2 0    Total GAD 7 Score 10 2    Anxiety Difficulty Somewhat difficult Not difficult at all       Information is confidential and restricted. Go to Review Flowsheets to unlock data.   Most Recent Fall Screen:    04/25/2021    8:09 AM 01/01/2021   12:06 PM 07/26/2019    1:50 PM  Fall Risk   Falls in the past year? 0 0 0  Number falls in past yr: 0 0   Injury with Fall? 0 0   Risk for fall due to :  No Fall Risks No Fall Risks   Follow up  Falls evaluation completed;Education provided     All ROS negative except what is listed above and in the HPI.   Past medical history, surgical history, medications, allergies, family history and social history reviewed with patient today and changes made to appropriate areas of the chart.  Past Medical History:  Past Medical History:  Diagnosis Date  . Anxiety   . Asthma    exercise-induced; prn inhaler  . Chronic infection of sinus 05/03/2015  . Depression    on zoloft, doing well, cut dose.  . Fibular avulsion fracture, ATFL sprain right ankle 11/16/2020  . Headache(784.0)    sinus  . History  of abnormal cervical Pap smear 07/26/2019  . History of MRSA infection 2008   left leg  . HPV in female   . Infection    UTI  . Insect bites 08/30/2013  . IT band syndrome 05/03/2015  . NSVD (normal spontaneous vaginal delivery) 09/17/2018  . Obesity (BMI 30-39.9) 05/03/2015  . PIH (pregnancy induced hypertension), third trimester 09/16/2018  . Preeclampsia, severe, third trimester 09/16/2018  . Recurrent knee pain 05/03/2015  . Seasonal allergies   . Surgical wound, non healing 08/2013   left breast  . Tendinitis, de Quervain's 05/08/2017  . Uses condoms for contraception 05/23/2015  . Vaginal Pap smear, abnormal    Medications:  Current Outpatient Medications on File Prior to Visit  Medication Sig  . ALPRAZolam (XANAX) 0.5 MG tablet Take 1 tablet (0.5 mg total) by mouth at bedtime as needed for anxiety.  Marland Kitchen azelastine (ASTELIN) 0.1 % nasal spray Place 2 sprays into both nostrils 2 (two) times daily as directed  . fluconazole (DIFLUCAN) 150 MG tablet Take one tablet by mouth at the first sign of symptoms of yeast infection. If no resolution, repeat dose in 72 hours.  . ondansetron (ZOFRAN) 4 MG tablet Take 1 tablet by mouth every 8  hours as needed for nausea or vomiting.  . Semaglutide-Weight Management (WEGOVY) 1.7 MG/0.75ML SOAJ Inject 1.7 mg into the skin once a week.  . sertraline (ZOLOFT) 50 MG tablet Take 1 tablet by mouth daily.  . Fluticasone-Umeclidin-Vilant (TRELEGY ELLIPTA) 200-62.5-25 MCG/ACT AEPB Inhale 1 puff into the lungs daily. (Patient not taking: Reported on 09/24/2021)  . HYDROcodone bit-homatropine (HYCODAN) 5-1.5 MG/5ML syrup Take 5 mLs by mouth every 8 (eight) hours as needed for cough. (Patient not taking: Reported on 09/24/2021)  . montelukast (SINGULAIR) 10 MG tablet Take 1 tablet (10 mg total) by mouth at bedtime. (Patient not taking: Reported on 09/24/2021)   No current facility-administered medications on file prior to visit.   Surgical History:  Past Surgical History:   Procedure Laterality Date  . BREAST CYST EXCISION Left 03/18/2013   Procedure: EXCISE CYST LEFT MEDIAL BREAST;  Surgeon: Velora Heckler, MD;  Location: [facility];  Service: General;  Laterality: Left;  Marland Kitchen MASS EXCISION Left 09/01/2013   Procedure: RE-EXCISION NON HEALING WOUND LEFT BREAST;  Surgeon: Velora Heckler, MD;  Location:  SURGERY CENTER;  Service: General;  Laterality: Left;  . TONSILLECTOMY    . TONSILLECTOMY AND ADENOIDECTOMY    . TYMPANOSTOMY TUBE PLACEMENT     x 3  . WISDOM TOOTH EXTRACTION     Allergies:  Allergies  Allergen Reactions  . Polytrim [Polymyxin B-Trimethoprim] Other (See Comments)    OPHTHALMIC - FACIAL/EYE SWELLING   Social History:  Social History   Socioeconomic History  . Marital  status: Married    Spouse name: Not on file  . Number of children: Not on file  . Years of education: Not on file  . Highest education level: Not on file  Occupational History  . Not on file  Tobacco Use  . Smoking status: Never  . Smokeless tobacco: Never  Vaping Use  . Vaping Use: Never used  Substance and Sexual Activity  . Alcohol use: Not Currently    Comment: rarely  . Drug use: No  . Sexual activity: Yes    Birth control/protection: None  Other Topics Concern  . Not on file  Social History Narrative  . Not on file   Social Determinants of Health   Financial Resource Strain: Not on file  Food Insecurity: Not on file  Transportation Needs: Not on file  Physical Activity: Not on file  Stress: Not on file  Social Connections: Not on file  Intimate Partner Violence: Not on file   Social History   Tobacco Use  Smoking Status Never  Smokeless Tobacco Never   Social History   Substance and Sexual Activity  Alcohol Use Not Currently   Comment: rarely   Family History:  Family History  Problem Relation Age of Onset  . Hyperlipidemia Father   . Hypertension Father   . Depression Father   . Hypothyroidism Mother   .  Thyroid disease Mother   . Cancer Maternal Grandfather        leukemia  . Cancer Paternal Grandfather        non-hodgkins lymphoma  . Stroke Maternal Grandmother        Objective:    BP 105/74   Pulse 83   Ht 5\' 3"  (1.6 m)   Wt 232 lb (105.2 kg)   SpO2 97%   BMI 41.10 kg/m   Wt Readings from Last 3 Encounters:  09/24/21 232 lb (105.2 kg)  04/25/21 228 lb 6.4 oz (103.6 kg)  01/01/21 241 lb 12.8 oz (109.7 kg)    Physical Exam  Results for orders placed or performed in visit on 04/25/21  CBC with Differential/Platelet  Result Value Ref Range   WBC 8.3 3.4 - 10.8 x10E3/uL   RBC 4.83 3.77 - 5.28 x10E6/uL   Hemoglobin 13.9 11.1 - 15.9 g/dL   Hematocrit 16.142.5 09.634.0 - 46.6 %   MCV 88 79 - 97 fL   MCH 28.8 26.6 - 33.0 pg   MCHC 32.7 31.5 - 35.7 g/dL   RDW 04.513.6 40.911.7 - 81.115.4 %   Platelets 317 150 - 450 x10E3/uL   Neutrophils 63 Not Estab. %   Lymphs 28 Not Estab. %   Monocytes 6 Not Estab. %   Eos 2 Not Estab. %   Basos 1 Not Estab. %   Neutrophils Absolute 5.3 1.4 - 7.0 x10E3/uL   Lymphocytes Absolute 2.3 0.7 - 3.1 x10E3/uL   Monocytes Absolute 0.5 0.1 - 0.9 x10E3/uL   EOS (ABSOLUTE) 0.1 0.0 - 0.4 x10E3/uL   Basophils Absolute 0.0 0.0 - 0.2 x10E3/uL   Immature Granulocytes 0 Not Estab. %   Immature Grans (Abs) 0.0 0.0 - 0.1 x10E3/uL    MMUNIZATIONS:   - Tdap: Tetanus vaccination status reviewed: {tetanus status:315746}. - Influenza: {Blank multiple:19197::"Up to date- completed ***","Administered today","Postponed to flu season","Declined","Education Provided"} - Pneumovax: {Blank multiple:19197::"Up to date- completed ***","Administered today","Not applicable","Declined","Education Provided"} - Prevnar: {Blank multiple:19197::"Up to date- completed ***","Administered today","Not applicable","Declined","Education Provided"} - HPV: {Blank multiple:19197::"Up to date- completed","Administered today","Not applicable","Declined","Education Provided"} - Zostavax vaccine: {Blank  multiple:19197::"Up to date- completed ***","Administered today","Not applicable","Declined","Education Provided"}  SCREENING COMPLETED: - Pap smear: {Blank multiple:19197::"Up to date- completed ***, due***","Completed today","Not Applicable","Declined","Education Provided"} - STI testing:{Blank multiple:19197::"Up to date- completed ***","Completed today","Not Applicable","Declined","Education Provided"} -Mammogram: {Blank multiple:19197::"Up to date- completed ***, due ***","Ordered today","Not Applicable","Declined","Education Provided"} - Colonoscopy: {{Blank multiple:19197::"Up to date- completed ***, due ***","Ordered today","Not Applicable","Declined","Education Provided"} - Bone Density: {{Blank multiple:19197::"Up to date- completed ***, due ***","Ordered today","Not Applicable","Declined","Education Provided"} -Hearing Test: {Blank multiple:19197::"Up to date- completed ***, due ***","Ordered today","Not Applicable","Declined","Education Provided"} -Spirometry: {Blank multiple:19197::"Up to date- completed ***, due ***","Ordered today","Not Applicable","Declined","Education Provided"}     Assessment & Plan:   Problem List Items Addressed This Visit   None      Follow up plan: No follow-ups on file.  NEXT PREVENTATIVE PHYSICAL DUE IN 1 YEAR.  PATIENT COUNSELING PROVIDED FOR ALL ADULT PATIENTS:  Consume a well balanced diet low in saturated fats, cholesterol, and moderation in carbohydrates.   This can be as simple as monitoring portion sizes and cutting back on sugary beverages such as soda and juice to start with.    Daily water consumption of at least 64 ounces.  Physical activity at least 180 minutes per week, if just starting out.   This can be as simple as taking the stairs instead of the elevator and walking 2-3 laps around the office  purposefully every day.   STD protection, partner selection, and regular testing if high risk.  Limited consumption of alcoholic  beverages if alcohol is consumed.  For women, I recommend no more than 7 alcoholic beverages per week, spread out throughout the week.  Avoid "binge" drinking or consuming large quantities of alcohol in one setting.   Please let me know if you feel you may need help with reduction or quitting alcohol consumption.   Avoidance of nicotine, if used.  Please let me know if you feel you may need help with reduction or quitting nicotine use.   Daily mental health attention.  This can be in the form of 5 minute daily meditation, prayer, journaling, yoga, reflection, etc.   Purposeful attention to your emotions and mental state can significantly improve your overall wellbeing and Health.  Please know that I am here to help you with all of your health care goals and am happy to work with you to find a solution that works best for you.  The greatest advice I have received with any changes in life are to take it one step at a time, that even means if all you can focus on is the next 60 seconds, then do that and celebrate your victories.  With any changes in life, you will have set backs, and that is OK. The important thing to remember is, if you have a set back, it is not a failure, it is an opportunity to try again!  Health Maintenance Recommendations Screening Testing Mammogram Every 1 -2 years based on history and risk factors Starting at age 45 Pap Smear Ages 21-39 every 3 years Ages 4-65 every 5 years with HPV testing More frequent testing may be required based on results and history Colon Cancer Screening Every 1-10 years based on test performed, risk factors, and history Starting at age 64 Bone Density Screening Every 2-10 years based on history Starting at age 12 for women Recommendations for men differ based on medication usage, history, and risk factors AAA Screening One time ultrasound Men 57-54 years old who have every smoked Lung Cancer Screening Low Dose Lung CT  every 12  months Age 34-80 years with a 30 pack-year smoking history who still smoke or who have quit within the last 15 years  Screening Labs Routine  Labs: Complete Blood Count (CBC), Complete Metabolic Panel (CMP), Cholesterol (Lipid Panel) Every 6-12 months based on history and medications May be recommended more frequently based on current conditions or previous results Hemoglobin A1c Lab Every 3-12 months based on history and previous results Starting at age 14 or earlier with diagnosis of diabetes, high cholesterol, BMI >26, and/or risk factors Frequent monitoring for patients with diabetes to ensure blood sugar control Thyroid Panel (TSH w/ T3 & T4) Every 6 months based on history, symptoms, and risk factors May be repeated more often if on medication HIV One time testing for all patients 18 and older May be repeated more frequently for patients with increased risk factors or exposure Hepatitis C One time testing for all patients 58 and older May be repeated more frequently for patients with increased risk factors or exposure Gonorrhea, Chlamydia Every 12 months for all sexually active persons 13-24 years Additional monitoring may be recommended for those who are considered high risk or who have symptoms PSA Men 31-66 years old with risk factors Additional screening may be recommended from age 100-69 based on risk factors, symptoms, and history  Vaccine Recommendations Tetanus Booster All adults every 10 years Flu Vaccine All patients 6 months and older every year COVID Vaccine All patients 12 years and older Initial dosing with booster May recommend additional booster based on age and health history HPV Vaccine 2 doses all patients age 48-26 Dosing may be considered for patients over 26 Shingles Vaccine (Shingrix) 2 doses all adults 55 years and older Pneumonia (Pneumovax 23) All adults 65 years and older May recommend earlier dosing based on health history Pneumonia (Prevnar  56) All adults 65 years and older Dosed 1 year after Pneumovax 23  Additional Screening, Testing, and Vaccinations may be recommended on an individualized basis based on family history, health history, risk factors, and/or exposure.

## 2021-09-24 NOTE — Patient Instructions (Addendum)
It was a pleasure seeing you today. I hope your time spent with [place] was pleasant and helpful. Please let us know if there is anything we can do to improve the service you receive.   Keep up the great work! Good luck on your flight today and I hope you have a great time! I have sent in the xanax for you for the flight.   I will let you know what your labs show and if we need to make any changes I will let you know.     Important Office Information Lab Results If labs were ordered, please note that you will see results through MyChart as soon as they come available from [facility].  It takes up to 5 business days for the results to be routed to me and for me to review them once all of the lab results have come through from [facility]. I will make recommendations based on your results and send these through MyChart or someone from the office will call you to discuss. If your labs are abnormal, we may contact you to schedule a visit to discuss the results and make recommendations.  If you have not heard from [place] within 5 business days or you have waited longer than a week and your lab results have not come through on MyChart, please feel free to call the office or send a message through MyChart to follow-up on these labs.   Referrals If referrals were placed today, the office where the referral was sent will contact you either by phone or through MyChart to set up scheduling. Please note that it can take up to a week for the referral office to contact you. If you do not hear from them in a week, please contact the referral office directly to inquire about scheduling.   Condition Treated If your condition worsens or you begin to have new symptoms, please schedule a follow-up appointment for further evaluation. If you are not sure if an appointment is needed, you may call the office to leave a message for the nurse and someone will contact you with recommendations.  If you have an urgent or life threatening  emergency, please do not call the office, but seek emergency evaluation by calling 911 or going to the nearest emergency room for evaluation.   MyChart and Phone Calls Please do not use MyChart for urgent messages. It may take up to 3 business days for MyChart messages to be read by staff and if they are unable to handle the request, an additional 3 business days for them to be routed to me and for my response.  Messages sent to the provider through MyChart do not come directly to the provider, please allow time for these messages to be routed and for me to respond.  We get a large volume of MyChart messages daily and these are responded to in the order received.   For urgent messages, please call the office at [phone] and speak with the front office staff or leave a message on the line of my assistant for guidance.  We are seeing patients from the hours of 8:00 am through 5:00 pm and calls directly to the nurse may not be answered immediately due to seeing patients, but your call will be returned as soon as possible.  Phone  messages received after 4:00 PM Monday through Thursday may not be returned until the following business day. Phone messages received after 11:00 AM on Friday may not be returned until  Monday.   After Hours We share on call hours with providers from other offices. If you have an urgent need after hours that cannot wait until the next business day, please contact the on call provider by calling the office number. A nurse will speak with you and contact the provider if needed for recommendations.  If you have an urgent or life threatening emergency after hours, please do not call the on call provider, but seek emergency evaluation by calling 911 or going to the nearest emergency room for evaluation.   Paperwork All paperwork requires a minimum of 5 days to complete and return to you or the designated personnel. Please keep this in mind when bringing in forms or sending  requests for paperwork completion to the office.

## 2021-09-25 ENCOUNTER — Encounter (HOSPITAL_BASED_OUTPATIENT_CLINIC_OR_DEPARTMENT_OTHER): Payer: Self-pay | Admitting: Nurse Practitioner

## 2021-09-25 DIAGNOSIS — Z0000 Encounter for general adult medical examination without abnormal findings: Secondary | ICD-10-CM | POA: Insufficient documentation

## 2021-09-25 LAB — CBC WITH DIFF/PLATELET
Basophils Absolute: 0 10*3/uL (ref 0.0–0.2)
Basos: 1 %
EOS (ABSOLUTE): 0.1 10*3/uL (ref 0.0–0.4)
Eos: 2 %
Hematocrit: 41 % (ref 34.0–46.6)
Hemoglobin: 13.9 g/dL (ref 11.1–15.9)
Immature Grans (Abs): 0 10*3/uL (ref 0.0–0.1)
Immature Granulocytes: 0 %
Lymphocytes Absolute: 2.1 10*3/uL (ref 0.7–3.1)
Lymphs: 40 %
MCH: 29.5 pg (ref 26.6–33.0)
MCHC: 33.9 g/dL (ref 31.5–35.7)
MCV: 87 fL (ref 79–97)
Monocytes Absolute: 0.3 10*3/uL (ref 0.1–0.9)
Monocytes: 6 %
Neutrophils Absolute: 2.7 10*3/uL (ref 1.4–7.0)
Neutrophils: 51 %
Platelets: 287 10*3/uL (ref 150–450)
RBC: 4.71 x10E6/uL (ref 3.77–5.28)
RDW: 13.6 % (ref 11.7–15.4)
WBC: 5.3 10*3/uL (ref 3.4–10.8)

## 2021-09-25 LAB — THYROID PANEL WITH TSH
Free Thyroxine Index: 1.6 (ref 1.2–4.9)
T3 Uptake Ratio: 24 % (ref 24–39)
T4, Total: 6.6 ug/dL (ref 4.5–12.0)
TSH: 1.05 u[IU]/mL (ref 0.450–4.500)

## 2021-09-25 LAB — COMPREHENSIVE METABOLIC PANEL
ALT: 17 IU/L (ref 0–32)
AST: 19 IU/L (ref 0–40)
Albumin/Globulin Ratio: 2.2 (ref 1.2–2.2)
Albumin: 4.6 g/dL (ref 3.9–4.9)
Alkaline Phosphatase: 73 IU/L (ref 44–121)
BUN/Creatinine Ratio: 18 (ref 9–23)
BUN: 14 mg/dL (ref 6–20)
Bilirubin Total: 0.3 mg/dL (ref 0.0–1.2)
CO2: 24 mmol/L (ref 20–29)
Calcium: 9.4 mg/dL (ref 8.7–10.2)
Chloride: 105 mmol/L (ref 96–106)
Creatinine, Ser: 0.79 mg/dL (ref 0.57–1.00)
Globulin, Total: 2.1 g/dL (ref 1.5–4.5)
Glucose: 74 mg/dL (ref 70–99)
Potassium: 4.1 mmol/L (ref 3.5–5.2)
Sodium: 143 mmol/L (ref 134–144)
Total Protein: 6.7 g/dL (ref 6.0–8.5)
eGFR: 99 mL/min/{1.73_m2} (ref >59)

## 2021-09-25 LAB — VITAMIN D 25 HYDROXY (VIT D DEFICIENCY, FRACTURES): Vit D, 25-Hydroxy: 32.2 ng/mL (ref 30.0–100.0)

## 2021-09-25 LAB — HEMOGLOBIN A1C
Est. average glucose Bld gHb Est-mCnc: 108 mg/dL
Hgb A1c MFr Bld: 5.4 % (ref 4.8–5.6)

## 2021-09-25 NOTE — Assessment & Plan Note (Signed)
CPE with no alarm symptoms present today.  We will monitor labs for complete evaluation.  Review of health maintenance activities and recommendations provided.  Diet and exercise recommendations discussed.  She is currently working on [employer] and praised for her efforts.  We will plan to make changes to plan of care as necessary based on findings of her labs as appropriate.

## 2021-10-08 ENCOUNTER — Telehealth: Payer: No Typology Code available for payment source | Admitting: Physician Assistant

## 2021-10-08 DIAGNOSIS — B9689 Other specified bacterial agents as the cause of diseases classified elsewhere: Secondary | ICD-10-CM

## 2021-10-08 DIAGNOSIS — J019 Acute sinusitis, unspecified: Secondary | ICD-10-CM | POA: Diagnosis not present

## 2021-10-08 MED ORDER — AMOXICILLIN-POT CLAVULANATE 875-125 MG PO TABS: 1.0000 | ORAL_TABLET | Freq: Two times a day (BID) | ORAL | 0 refills | Status: DC

## 2021-10-08 NOTE — Progress Notes (Signed)

## 2021-10-08 NOTE — Progress Notes (Signed)
I have spent 5 minutes in review of e-visit questionnaire, review and updating patient chart, medical decision making and response to patient.   Shatima Zalar Cody Moani Weipert, PA-C    

## 2021-12-20 ENCOUNTER — Other Ambulatory Visit (HOSPITAL_COMMUNITY): Payer: Self-pay

## 2022-02-07 ENCOUNTER — Encounter: Payer: Self-pay | Admitting: Internal Medicine

## 2022-02-08 ENCOUNTER — Telehealth: Payer: 59 | Admitting: Nurse Practitioner

## 2022-02-08 DIAGNOSIS — J069 Acute upper respiratory infection, unspecified: Secondary | ICD-10-CM

## 2022-02-08 MED ORDER — FLUTICASONE PROPIONATE 50 MCG/ACT NA SUSP: 2.0000 | Freq: Every day | NASAL | 6 refills | Status: DC

## 2022-02-08 NOTE — Progress Notes (Signed)
E-Visit for Sinus Problems  We are sorry that you are not feeling well.  Here is how we plan to help!  Based on what you have shared with me it looks like you have sinusitis.  Sinusitis is inflammation and infection in the sinus cavities of the head.  Based on your presentation I believe you most likely have Acute Viral Sinusitis.This is an infection most likely caused by a virus. There is not specific treatment for viral sinusitis other than to help you with the symptoms until the infection runs its course.  You may use an oral decongestant such as Mucinex D or if you have glaucoma or high blood pressure use plain Mucinex. Saline nasal spray help and can safely be used as often as needed for congestion, I have prescribed: Fluticasone nasal spray two sprays in each nostril once a day  Some authorities believe that zinc sprays or the use of Echinacea may shorten the course of your symptoms.  Sinus infections are not as easily transmitted as other respiratory infection, however we still recommend that you avoid close contact with loved ones, especially the very young and elderly.  Remember to wash your hands thoroughly throughout the day as this is the number one way to prevent the spread of infection!   Providers prescribe antibiotics to treat infections caused by bacteria. Antibiotics are very powerful in treating bacterial infections when they are used properly. To maintain their effectiveness, they should be used only when necessary. Overuse of antibiotics has resulted in the development of superbugs that are resistant to treatment!    After careful review of your answers, I would not recommend an antibiotic for your condition.  Antibiotics are not effective against viruses and therefore should not be used to treat them. Common examples of infections caused by viruses include colds and flu  Home Care: Only take medications as instructed by your medical team. Do not take these medications with  alcohol. A steam or ultrasonic humidifier can help congestion.  You can place a towel over your head and breathe in the steam from hot water coming from a faucet. Avoid close contacts especially the very young and the elderly. Cover your mouth when you cough or sneeze. Always remember to wash your hands.  Get Help Right Away If: You develop worsening fever or sinus pain. You develop a severe head ache or visual changes. Your symptoms persist after you have completed your treatment plan.  Make sure you Understand these instructions. Will watch your condition. Will get help right away if you are not doing well or get worse.   Thank you for choosing an e-visit.  Your e-visit answers were reviewed by a board certified advanced clinical practitioner to complete your personal care plan. Depending upon the condition, your plan could have included both over the counter or prescription medications.  Please review your pharmacy choice. Make sure the pharmacy is open so you can pick up prescription now. If there is a problem, you may contact your provider through [employer] and have the prescription routed to another pharmacy.  Your safety is important to [place]. If you have drug allergies check your prescription carefully.   For the next 24 hours you can use MyChart to ask questions about today's visit, request a non-urgent call back, or ask for a work or school excuse. You will get an email in the next two days asking about your experience. I hope that your e-visit has been valuable and will speed your recovery.  Meds ordered this encounter  Medications   fluticasone (FLONASE) 50 MCG/ACT nasal spray    Sig: Place 2 sprays into both nostrils daily.    Dispense:  16 g    Refill:  6    I spent approximately 5 minutes reviewing the patient's history, current symptoms and coordinating their care today.

## 2022-02-21 ENCOUNTER — Telehealth: Payer: 59 | Admitting: Physician Assistant

## 2022-02-21 DIAGNOSIS — H66002 Acute suppurative otitis media without spontaneous rupture of ear drum, left ear: Secondary | ICD-10-CM

## 2022-02-21 MED ORDER — AMOXICILLIN-POT CLAVULANATE 875-125 MG PO TABS: 1.0000 | ORAL_TABLET | Freq: Two times a day (BID) | ORAL | 0 refills | Status: DC

## 2022-02-21 NOTE — Progress Notes (Signed)

## 2022-03-04 ENCOUNTER — Encounter: Payer: Self-pay | Admitting: Nurse Practitioner

## 2022-03-05 ENCOUNTER — Other Ambulatory Visit: Payer: Self-pay | Admitting: Nurse Practitioner

## 2022-03-05 ENCOUNTER — Other Ambulatory Visit: Payer: Self-pay

## 2022-03-05 DIAGNOSIS — F419 Anxiety disorder, unspecified: Secondary | ICD-10-CM

## 2022-03-05 MED ORDER — SERTRALINE HCL 100 MG PO TABS
100.0000 mg | ORAL_TABLET | Freq: Every day | ORAL | 3 refills | Status: DC
  Filled 2022-03-05: qty 90, 90d supply, fill #0
  Filled 2022-06-10: qty 90, 90d supply, fill #1
  Filled 2022-09-14: qty 90, 90d supply, fill #2
  Filled 2022-12-15: qty 90, 90d supply, fill #3

## 2022-03-20 ENCOUNTER — Other Ambulatory Visit (HOSPITAL_COMMUNITY): Payer: Self-pay

## 2022-03-27 ENCOUNTER — Ambulatory Visit (HOSPITAL_BASED_OUTPATIENT_CLINIC_OR_DEPARTMENT_OTHER): Payer: No Typology Code available for payment source | Admitting: Nurse Practitioner

## 2022-05-17 ENCOUNTER — Telehealth: Payer: 59 | Admitting: Physician Assistant

## 2022-05-17 DIAGNOSIS — J019 Acute sinusitis, unspecified: Secondary | ICD-10-CM

## 2022-05-17 DIAGNOSIS — B9689 Other specified bacterial agents as the cause of diseases classified elsewhere: Secondary | ICD-10-CM | POA: Diagnosis not present

## 2022-05-17 MED ORDER — AMOXICILLIN-POT CLAVULANATE 875-125 MG PO TABS: 1.0000 | ORAL_TABLET | Freq: Two times a day (BID) | ORAL | 0 refills | Status: DC

## 2022-05-17 NOTE — Progress Notes (Signed)
I have spent 5 minutes in review of e-visit questionnaire, review and updating patient chart, medical decision making and response to patient.   Fate Caster Cody Montarius Kitagawa, PA-C    

## 2022-05-17 NOTE — Progress Notes (Signed)

## 2022-05-23 ENCOUNTER — Other Ambulatory Visit (HOSPITAL_COMMUNITY): Payer: Self-pay

## 2022-05-23 MED ORDER — AMOXICILLIN-POT CLAVULANATE 875-125 MG PO TABS
1.0000 | ORAL_TABLET | Freq: Two times a day (BID) | ORAL | 0 refills | Status: DC
Start: 2022-05-23 — End: 2022-08-12
  Filled 2022-05-23: qty 20, 10d supply, fill #0

## 2022-06-10 ENCOUNTER — Other Ambulatory Visit (HOSPITAL_COMMUNITY): Payer: Self-pay

## 2022-08-07 NOTE — Progress Notes (Signed)
Subjective:   Deanna Gross September 04, 1985  08/12/2022   CC: Chief Complaint  Patient presents with   Annual Exam    Pt is here for her physical. Pt denies any concerns for today's visit.    HPI: Deanna Gross is a 37 y.o. female who presents for a routine health maintenance exam.  Labs collected at time of visit.    HEALTH SCREENINGS: - Vision Screening:  Appt August 5th - Dental Visits: up to date - Pap smear:  Due Today, will complete - Breast Exam:  will complete  - STD Screening: Declined - Mammogram (40+): Not applicable  - Colonoscopy (45+): Not applicable  - Bone Density (65+ or under 65 with predisposing conditions): Not applicable  - Lung CA screening with low-dose CT:  Not applicable Adults age 65-80 who are current cigarette smokers or quit within the last 15 years. Must have 20 pack year history.   Depression and Anxiety Screen done today and results listed below:     08/12/2022    3:25 PM 04/25/2021    8:10 AM 01/01/2021   12:07 PM 07/26/2019    1:50 PM 08/18/2016    4:10 PM  Depression screen PHQ 2/9  Decreased Interest 0 0 0 0   Down, Depressed, Hopeless 0 0 0 0   PHQ - 2 Score 0 0 0 0   Altered sleeping 1  1 0   Tired, decreased energy 1  1 0   Change in appetite 1  0 0   Feeling bad or failure about yourself  0  0 0   Trouble concentrating 1  0 0   Moving slowly or fidgety/restless 0  0 0   Suicidal thoughts 0  0 0   PHQ-9 Score 4  2 0   Difficult doing work/chores Somewhat difficult  Not difficult at all Not difficult at all      Information is confidential and restricted. Go to Review Flowsheets to unlock data.      08/12/2022    3:25 PM 01/01/2021   12:07 PM 07/26/2019    1:50 PM 08/04/2016    4:24 PM  GAD 7 : Generalized Anxiety Score  Nervous, Anxious, on Edge 2 2 1    Control/stop worrying 1 2 0   Worry too much - different things 2 2 0   Trouble relaxing 1 0 0   Restless 0 0 0   Easily annoyed or irritable 2 2 1    Afraid - awful might  happen 1 2 0   Total GAD 7 Score 9 10 2    Anxiety Difficulty Somewhat difficult Somewhat difficult Not difficult at all      Information is confidential and restricted. Go to Review Flowsheets to unlock data.    IMMUNIZATIONS: - Tdap: Tetanus vaccination status reviewed: last tetanus booster within 10 years. - HPV: Not applicable - Influenza: Postponed to flu season - Pneumovax: Not applicable - Prevnar 20: Not applicable - Zostavax (50+): Not applicable   Past medical history, surgical history, medications, allergies, family history and social history reviewed with patient today and changes made to appropriate areas of the chart.   Past Medical History:  Diagnosis Date   Allergic rhinitis 05/03/2015   Anxiety    Asthma    exercise-induced; prn inhaler   Chronic infection of sinus 05/03/2015   Community acquired pneumonia 03/08/2021   Depression    on zoloft, doing well, cut dose.   Fibular avulsion fracture, ATFL sprain right ankle  11/16/2020   Fibular avulsion fracture, ATFL sprain right ankle 11/16/2020   Headache(784.0)    sinus   History of abnormal cervical Pap smear 07/26/2019   History of MRSA infection 2008   left leg   History of pre-eclampsia 07/26/2019   HPV in female    Infection    UTI   Insect bites 08/30/2013   IT band syndrome 05/03/2015   NSVD (normal spontaneous vaginal delivery) 09/17/2018   Obesity (BMI 30-39.9) 05/03/2015   PIH (pregnancy induced hypertension), third trimester 09/16/2018   Preeclampsia, severe, third trimester 09/16/2018   Recurrent knee pain 05/03/2015   Seasonal allergies    Subacute cough 04/29/2021   Surgical wound, non healing 08/2013   left breast   Tendinitis, de Quervain's 05/08/2017   Uses condoms for contraception 05/23/2015   Vaginal Pap smear, abnormal     Past Surgical History:  Procedure Laterality Date   BREAST CYST EXCISION Left 03/18/2013   Procedure: EXCISE CYST LEFT MEDIAL BREAST;  Surgeon: Velora Heckler, MD;  Location: [facility]  [place];  Service: General;  Laterality: Left;   MASS EXCISION Left 09/01/2013   Procedure: RE-EXCISION NON HEALING WOUND LEFT BREAST;  Surgeon: Velora Heckler, MD;  Location: [facility];  Service: General;  Laterality: Left;   TONSILLECTOMY     TONSILLECTOMY AND ADENOIDECTOMY     TYMPANOSTOMY TUBE PLACEMENT     x 3   WISDOM TOOTH EXTRACTION      Current Outpatient Medications on File Prior to Visit  Medication Sig   ALPRAZolam (XANAX) 0.5 MG tablet Take 1/2 to 1 tablet by mouth 30 minutes prior to flying.   fluticasone (FLONASE) 50 MCG/ACT nasal spray Place 2 sprays into both nostrils daily.   ondansetron (ZOFRAN) 4 MG tablet Take 1 tablet by mouth every 8  hours as needed for nausea or vomiting.   sertraline (ZOLOFT) 100 MG tablet Take 1 tablet (100 mg total) by mouth daily.   No current facility-administered medications on file prior to visit.    Allergies  Allergen Reactions   Polytrim [Polymyxin B-Trimethoprim] Other (See Comments)    OPHTHALMIC - FACIAL/EYE SWELLING     Social History   Socioeconomic History   Marital status: Married    Spouse name: Not on file   Number of children: Not on file   Years of education: Not on file   Highest education level: Not on file  Occupational History   Not on file  Tobacco Use   Smoking status: Never   Smokeless tobacco: Never  Vaping Use   Vaping status: Never Used  Substance and Sexual Activity   Alcohol use: Not Currently    Comment: rarely   Drug use: No   Sexual activity: Yes    Birth control/protection: None  Other Topics Concern   Not on file  Social History Narrative   Not on file   Social Determinants of Health   Financial Resource Strain: Not on file  Food Insecurity: Not on file  Transportation Needs: Not on file  Physical Activity: Not on file  Stress: Not on file  Social Connections: Not on file  Intimate Partner Violence: Not on file   Social History   Tobacco Use   Smoking Status Never  Smokeless Tobacco Never   Social History   Substance and Sexual Activity  Alcohol Use Not Currently   Comment: rarely    Family History  Problem Relation Age of Onset   Hyperlipidemia Father  Hypertension Father    Depression Father    Hypothyroidism Mother    Thyroid disease Mother    Cancer Maternal Grandfather        leukemia   Cancer Paternal Grandfather        non-hodgkins lymphoma   Stroke Maternal Grandmother      ROS: Denies fever, fatigue, unexplained weight loss/gain, chest pain, SHOB, and palpitations. Denies neurological deficits, gastrointestinal or genitourinary complaints, and skin changes.   Objective:   Today's Vitals   08/12/22 1521  BP: 128/83  Pulse: 66  SpO2: 98%  Weight: 239 lb 3.2 oz (108.5 kg)  Height: 5\' 3"  (1.6 m)    GENERAL APPEARANCE: Well-appearing, in NAD. Well nourished.  SKIN: Pink, warm and dry. Turgor normal. No rash, lesion, ulceration, or ecchymoses. Hair evenly distributed.  HEENT: HEAD: Normocephalic.  EYES: PERRLA. EOMI. Lids intact w/o defect. Sclera white, Conjunctiva pink w/o exudate.  EARS: External ear w/o redness, swelling, masses or lesions. EAC clear. TM's intact, translucent w/o bulging, appropriate landmarks visualized. Appropriate acuity to conversational tones.  NOSE: Septum midline w/o deformity. Nares patent, mucosa pink and non-inflamed w/o drainage. No sinus tenderness.  THROAT: Uvula midline. Oropharynx clear. Tonsils non-inflamed w/o exudate. Oral mucosa pink and moist.  NECK: Supple, Trachea midline. Full ROM w/o pain or tenderness. No lymphadenopathy. Thyroid non-tender w/o enlargement or palpable masses.  BREASTS: Breasts pendulous, symmetrical, and w/o palpable masses. Nipples everted and w/o discharge. No rash or skin retraction. No axillary or supraclavicular lymphadenopathy.  RESPIRATORY: Chest wall symmetrical w/o masses. Respirations even and non-labored. Breath sounds clear to  auscultation bilaterally. No wheezes, rales, rhonchi, or crackles. CARDIAC: S1, S2 present, regular rate and rhythm. No gallops, murmurs, rubs, or clicks. PMI w/o lifts, heaves, or thrills. No carotid bruits. Capillary refill <2 seconds. Peripheral pulses 2+ bilaterally. GI: Abdomen soft w/o distention. Normoactive bowel sounds. No palpable masses or tenderness. No guarding or rebound tenderness. Liver and spleen w/o tenderness or enlargement. No CVA tenderness.  GU: External genitalia without erythema, lesions, or masses. No lymphadenopathy. Vaginal mucosa pink and moist without exudate, lesions, or ulcerations. Cervix pink without discharge. Cervical os closed. Uterus and adnexae palpable, not enlarged, and w/o tenderness. No palpable masses.  MSK: Muscle tone and strength appropriate for age, w/o atrophy or abnormal movement.  EXTREMITIES: Active ROM intact, w/o tenderness, crepitus, or contracture. No obvious joint deformities or effusions. No clubbing, edema, or cyanosis.  NEUROLOGIC: CN's II-XII intact. Motor strength symmetrical with no obvious weakness. No sensory deficits. DTR's 2+ symmetric bilaterally. Steady, even gait.  PSYCH/MENTAL STATUS: Alert, oriented x 3. Cooperative, appropriate mood and affect.   Chaperoned by Cristy Hilts, CMA    Assessment & Plan:  1. Annual physical exam Discussed preventative screenings and healthy eating/exercise with patient. Will obtain fasting labwork within the week. Up to date on all other preventative screenings. Pap performed today.   - Lipid panel; Future - Comprehensive metabolic panel; Future - CBC with Differential/Platelet; Future - Hemoglobin A1c; Future - TSH; Future - VITAMIN D 25 Hydroxy (Vit-D Deficiency, Fractures); Future  2. Encounter for Papanicolaou smear for cervical cancer screening Patient tolerated pap well and chaperone present for exam. Will notify pt of pap results.   - Cytology - PAP  3. BMI 40.0-44.9, adult  [facility]) Discussed healthy habits and exercise with patient. Recommended separate appt for weight management discussion if desired.    Orders Placed This Encounter  Procedures   Lipid panel    Standing Status:  Future    Standing Expiration Date:   08/12/2023   Comprehensive metabolic panel    Standing Status:   Future    Standing Expiration Date:   08/12/2023   CBC with Differential/Platelet    Standing Status:   Future    Standing Expiration Date:   08/12/2023   Hemoglobin A1c    Standing Status:   Future    Standing Expiration Date:   08/12/2023   TSH    Standing Status:   Future    Standing Expiration Date:   08/12/2023   VITAMIN D 25 Hydroxy (Vit-D Deficiency, Fractures)    Standing Status:   Future    Standing Expiration Date:   08/12/2023    PATIENT COUNSELING:  - Encouraged a healthy well-balanced diet. Patient may adjust caloric intake to maintain or achieve ideal body weight. May reduce intake of dietary saturated fat and total fat and have adequate dietary potassium and calcium preferably from fresh fruits, vegetables, and low-fat dairy products.   - Advised to avoid cigarette smoking. - Discussed with the patient that most people either abstain from alcohol or drink within safe limits (<=14/week and <=4 drinks/occasion for males, <=7/weeks and <= 3 drinks/occasion for females) and that the risk for alcohol disorders and other health effects rises proportionally with the number of drinks per week and how often a drinker exceeds daily limits. - Discussed cessation/primary prevention of drug use and availability of treatment for abuse.  - Discussed sexually transmitted diseases, avoidance of unintended pregnancy and contraceptive alternatives.  - Stressed the importance of regular exercise - Injury prevention: Discussed safety belts, safety helmets, smoke detector, smoking near bedding or upholstery.  - Dental health: Discussed importance of regular tooth brushing, flossing, and  dental visits.   NEXT PREVENTATIVE PHYSICAL DUE IN 1 YEAR.  Return in about 1 year (around 08/12/2023) for ANNUAL PHYSICAL.  Patient to reach out to office if new, worrisome, or unresolved symptoms arise or if no improvement in patient's condition. Patient verbalized understanding and is agreeable to treatment plan. All questions answered to patient's satisfaction.    Yolanda Manges, FNP

## 2022-08-12 ENCOUNTER — Other Ambulatory Visit (HOSPITAL_COMMUNITY)
Admission: RE | Admit: 2022-08-12 | Discharge: 2022-08-12 | Disposition: A | Discharge status: Home / Self Care | Payer: 59 | Source: Ambulatory Visit | Attending: Family Medicine | Admitting: Family Medicine

## 2022-08-12 ENCOUNTER — Ambulatory Visit (INDEPENDENT_AMBULATORY_CARE_PROVIDER_SITE_OTHER): Payer: 59 | Admitting: Family Medicine

## 2022-08-12 ENCOUNTER — Encounter (HOSPITAL_BASED_OUTPATIENT_CLINIC_OR_DEPARTMENT_OTHER): Payer: Self-pay | Admitting: Family Medicine

## 2022-08-12 VITALS — BP 128/83 | HR 66 | Ht 63.0 in | Wt 239.2 lb

## 2022-08-12 DIAGNOSIS — Z124 Encounter for screening for malignant neoplasm of cervix: Secondary | ICD-10-CM | POA: Diagnosis not present

## 2022-08-12 DIAGNOSIS — Z6841 Body Mass Index (BMI) 40.0 and over, adult: Secondary | ICD-10-CM | POA: Diagnosis not present

## 2022-08-12 DIAGNOSIS — Z0000 Encounter for general adult medical examination without abnormal findings: Secondary | ICD-10-CM

## 2022-08-12 NOTE — Patient Instructions (Signed)
Please make a lab appt to have your bloodwork drawn.   Things to do to keep yourself healthy: - Exercise at least 30-45 minutes a day, 3-4 days a week.  - Eat a low-fat diet with lots of fruits and vegetables, up to 7-9 servings per day.  - Seatbelts can save your life. Wear them always.  - Smoke detectors on every level of your home, check batteries every year.  - Eye Doctor - have an eye exam every 1-2 years  - Safe sex - if you may be exposed to STDs, use a condom.  - No smoking, vaping, or use of any tobacco products.  - Alcohol -  If you drink, do it moderately, less than 2 drinks per day.  - No illegal drug use.  - Depression is common in our stressful world.If you're feeling down or losing interest in things you normally enjoy, please come in for a visit.  - Violence - If anyone is threatening or hurting you, please call immediately.

## 2022-08-14 LAB — CYTOLOGY - PAP
Comment: NEGATIVE
Diagnosis: NEGATIVE
High risk HPV: NEGATIVE

## 2022-08-14 NOTE — Progress Notes (Signed)
Pap Smear is normal. Health maintenance updated. Repeat pap in 3 years.

## 2022-08-15 ENCOUNTER — Other Ambulatory Visit (HOSPITAL_BASED_OUTPATIENT_CLINIC_OR_DEPARTMENT_OTHER): Payer: 59

## 2022-08-15 DIAGNOSIS — Z0000 Encounter for general adult medical examination without abnormal findings: Secondary | ICD-10-CM | POA: Diagnosis not present

## 2022-08-16 LAB — COMPREHENSIVE METABOLIC PANEL
ALT: 22 IU/L (ref 0–32)
AST: 20 IU/L (ref 0–40)
Albumin: 4.4 g/dL (ref 3.9–4.9)
Alkaline Phosphatase: 89 IU/L (ref 44–121)
BUN/Creatinine Ratio: 24 — ABNORMAL HIGH (ref 9–23)
BUN: 17 mg/dL (ref 6–20)
Bilirubin Total: 0.3 mg/dL (ref 0.0–1.2)
CO2: 24 mmol/L (ref 20–29)
Calcium: 9.4 mg/dL (ref 8.7–10.2)
Chloride: 104 mmol/L (ref 96–106)
Creatinine, Ser: 0.7 mg/dL (ref 0.57–1.00)
Globulin, Total: 2.2 g/dL (ref 1.5–4.5)
Glucose: 96 mg/dL (ref 70–99)
Potassium: 4.6 mmol/L (ref 3.5–5.2)
Sodium: 141 mmol/L (ref 134–144)
Total Protein: 6.6 g/dL (ref 6.0–8.5)
eGFR: 113 mL/min/{1.73_m2} (ref >59)

## 2022-08-16 LAB — HEMOGLOBIN A1C
Est. average glucose Bld gHb Est-mCnc: 108 mg/dL
Hgb A1c MFr Bld: 5.4 % (ref 4.8–5.6)

## 2022-08-16 LAB — CBC WITH DIFFERENTIAL/PLATELET
Basophils Absolute: 0 10*3/uL (ref 0.0–0.2)
Basos: 1 %
EOS (ABSOLUTE): 0.1 10*3/uL (ref 0.0–0.4)
Eos: 2 %
Hematocrit: 43.5 % (ref 34.0–46.6)
Hemoglobin: 13.9 g/dL (ref 11.1–15.9)
Immature Grans (Abs): 0 10*3/uL (ref 0.0–0.1)
Immature Granulocytes: 0 %
Lymphocytes Absolute: 2.2 10*3/uL (ref 0.7–3.1)
Lymphs: 42 %
MCH: 27.9 pg (ref 26.6–33.0)
MCHC: 32 g/dL (ref 31.5–35.7)
MCV: 87 fL (ref 79–97)
Monocytes Absolute: 0.3 10*3/uL (ref 0.1–0.9)
Monocytes: 6 %
Neutrophils Absolute: 2.6 10*3/uL (ref 1.4–7.0)
Neutrophils: 49 %
Platelets: 269 10*3/uL (ref 150–450)
RBC: 4.99 x10E6/uL (ref 3.77–5.28)
RDW: 13.8 % (ref 11.7–15.4)
WBC: 5.2 10*3/uL (ref 3.4–10.8)

## 2022-08-16 LAB — LIPID PANEL
Chol/HDL Ratio: 2.4 ratio (ref 0.0–4.4)
Cholesterol, Total: 218 mg/dL — ABNORMAL HIGH (ref 100–199)
HDL: 91 mg/dL (ref >39)
LDL Chol Calc (NIH): 112 mg/dL — ABNORMAL HIGH (ref 0–99)
Triglycerides: 85 mg/dL (ref 0–149)
VLDL Cholesterol Cal: 15 mg/dL (ref 5–40)

## 2022-08-16 LAB — TSH: TSH: 1.12 u[IU]/mL (ref 0.450–4.500)

## 2022-08-16 LAB — VITAMIN D 25 HYDROXY (VIT D DEFICIENCY, FRACTURES): Vit D, 25-Hydroxy: 61 ng/mL (ref 30.0–100.0)

## 2022-08-18 NOTE — Progress Notes (Signed)
Hi Deanna Gross,  Your labs overall look great. Your vitamin D is excellent, thyroid and A1C are normal, and your blood counts are normal. Your cholesterol is slightly Elevated.Avoid greasy/fatty foods. Adhere to a diet with lean proteins, vegetables, fruits, and low carbohydrates. Drink plenty of water. Regular exercise. A supplement like Omega 3 can help to lower cholesterol as well. We will recheck these in 1 year.

## 2022-09-01 DIAGNOSIS — H52223 Regular astigmatism, bilateral: Secondary | ICD-10-CM | POA: Diagnosis not present

## 2022-09-01 DIAGNOSIS — H5203 Hypermetropia, bilateral: Secondary | ICD-10-CM | POA: Diagnosis not present

## 2022-09-15 ENCOUNTER — Other Ambulatory Visit: Payer: Self-pay

## 2022-09-24 ENCOUNTER — Telehealth: Payer: 59 | Admitting: Nurse Practitioner

## 2022-09-24 ENCOUNTER — Telehealth: Payer: 59 | Admitting: Family Medicine

## 2022-09-24 DIAGNOSIS — U071 COVID-19: Secondary | ICD-10-CM

## 2022-09-24 DIAGNOSIS — R062 Wheezing: Secondary | ICD-10-CM

## 2022-09-24 MED ORDER — ALBUTEROL SULFATE HFA 108 (90 BASE) MCG/ACT IN AERS
1.0000 | INHALATION_SPRAY | Freq: Four times a day (QID) | RESPIRATORY_TRACT | 0 refills | Status: AC | PRN
Start: 2022-09-24 — End: ?

## 2022-09-24 MED ORDER — PSEUDOEPH-BROMPHEN-DM 30-2-10 MG/5ML PO SYRP
5.0000 mL | ORAL_SOLUTION | Freq: Four times a day (QID) | ORAL | 0 refills | Status: DC | PRN
Start: 2022-09-24 — End: 2022-09-25

## 2022-09-24 MED ORDER — TRELEGY ELLIPTA 200-62.5-25 MCG/ACT IN AEPB
1.0000 | INHALATION_SPRAY | Freq: Once | RESPIRATORY_TRACT | 0 refills | Status: AC
Start: 2022-09-24 — End: 2022-09-24

## 2022-09-24 NOTE — Patient Instructions (Addendum)
Deanna Gross, thank you for joining Freddy Finner, NP for today's virtual visit.  While this provider is not your primary care provider (PCP), if your PCP is located in our provider database this encounter information will be shared with them immediately following your visit.   A [facility] MyChart account gives you access to today's visit and all your visits, tests, and labs performed at [facility] " click here if you don't have a [facility] MyChart account or go to [email or web address]  Consent: (Patient) Deanna Gross provided verbal consent for this virtual visit at the beginning of the encounter.  Current Medications:  Current Outpatient Medications:    albuterol (VENTOLIN HFA) 108 (90 Base) MCG/ACT inhaler, Inhale 1-2 puffs into the lungs every 6 (six) hours as needed for wheezing or shortness of breath., Disp: 8 g, Rfl: 0   brompheniramine-pseudoephedrine-DM 30-2-10 MG/5ML syrup, Take 5 mLs by mouth 4 (four) times daily as needed., Disp: 120 mL, Rfl: 0   Fluticasone-Umeclidin-Vilant (TRELEGY ELLIPTA) 200-62.5-25 MCG/ACT AEPB, Inhale 1 puff into the lungs once for 1 dose., Disp: 1 each, Rfl: 0   ALPRAZolam (XANAX) 0.5 MG tablet, Take 1/2 to 1 tablet by mouth 30 minutes prior to flying., Disp: 20 tablet, Rfl: 2   fluticasone (FLONASE) 50 MCG/ACT nasal spray, Place 2 sprays into both nostrils daily., Disp: 16 g, Rfl: 6   ondansetron (ZOFRAN) 4 MG tablet, Take 1 tablet by mouth every 8  hours as needed for nausea or vomiting., Disp: 20 tablet, Rfl: 3   sertraline (ZOLOFT) 100 MG tablet, Take 1 tablet (100 mg total) by mouth daily., Disp: 90 tablet, Rfl: 3   Medications ordered in this encounter:  Meds ordered this encounter  Medications   brompheniramine-pseudoephedrine-DM 30-2-10 MG/5ML syrup    Sig: Take 5 mLs by mouth 4 (four) times daily as needed.    Dispense:  120 mL    Refill:  0    Order Specific Question:   Supervising Provider    Answer:   Merrilee Jansky [[id number]]   albuterol (VENTOLIN HFA) 108 (90 Base) MCG/ACT inhaler    Sig: Inhale 1-2 puffs into the lungs every 6 (six) hours as needed for wheezing or shortness of breath.    Dispense:  8 g    Refill:  0    Order Specific Question:   Supervising Provider    Answer:   Merrilee Jansky [[id number]]   Fluticasone-Umeclidin-Vilant (TRELEGY ELLIPTA) 200-62.5-25 MCG/ACT AEPB    Sig: Inhale 1 puff into the lungs once for 1 dose.    Dispense:  1 each    Refill:  0    Order Specific Question:   Supervising Provider    Answer:   Merrilee Jansky [id number]     *If you need refills on other medications prior to your next appointment, please contact your pharmacy*  Follow-Up: Call back or seek an in-person evaluation if the symptoms worsen or if the condition fails to improve as anticipated.   Virtual Care [phone]  Care Instructions:  - Continue OTC symptomatic management of choice - Take prescribed medications as directed - Push fluids - Rest as needed  I hope you feel better soon!    Isolation Instructions: You are to isolate at home until you have been fever free for at least 24 hours without a fever-reducing medication, and symptoms have been steadily improving for 24 hours. At that time,  you can end isolation but need to mask  for an additional 5 days.   If you must be around other household members who do not have symptoms, you need to make sure that both you and the family members are masking consistently with a high-quality mask.  If you note any worsening of symptoms despite treatment, please seek an in-person evaluation ASAP. If you note any significant shortness of breath or any chest pain, please seek ER evaluation. Please do not delay care!   COVID-19: What to Do if You Are Sick If you test positive and are an older adult or someone who is at high risk of getting very sick from COVID-19, treatment may be available. Contact a healthcare provider  right away after a positive test to determine if you are eligible, even if your symptoms are mild right now. You can also visit a Test to Treat location and, if eligible, receive a prescription from a provider. Don't delay: Treatment must be started within the first few days to be effective. If you have a fever, cough, or other symptoms, you might have COVID-19. Most people have mild illness and are able to recover at home. If you are sick: Keep track of your symptoms. If you have an emergency warning sign (including trouble breathing), call 911. Steps to help prevent the spread of COVID-19 if you are sick If you are sick with COVID-19 or think you might have COVID-19, follow the steps below to care for yourself and to help protect other people in your home and community. Stay home except to get medical care Stay home. Most people with COVID-19 have mild illness and can recover at home without medical care. Do not leave your home, except to get medical care. Do not visit public areas and do not go to places where you are unable to wear a mask. Take care of yourself. Get rest and stay hydrated. Take over-the-counter medicines, such as acetaminophen, to help you feel better. Stay in touch with your doctor. Call before you get medical care. Be sure to get care if you have trouble breathing, or have any other emergency warning signs, or if you think it is an emergency. Avoid public transportation, ride-sharing, or taxis if possible. Get tested If you have symptoms of COVID-19, get tested. While waiting for test results, stay away from others, including staying apart from those living in your household. Get tested as soon as possible after your symptoms start. Treatments may be available for people with COVID-19 who are at risk for becoming very sick. Don't delay: Treatment must be started early to be effective--some treatments must begin within 5 days of your first symptoms. Contact your healthcare provider  right away if your test result is positive to determine if you are eligible. Self-tests are one of several options for testing for the virus that causes COVID-19 and may be more convenient than laboratory-based tests and point-of-care tests. Ask your healthcare provider or your local health department if you need help interpreting your test results. You can visit your state, tribal, local, and territorial health department's website to look for the latest local information on testing sites. Separate yourself from other people As much as possible, stay in a specific room and away from other people and pets in your home. If possible, you should use a separate bathroom. If you need to be around other people or animals in or outside of the home, wear a well-fitting mask. Tell your close contacts that they may have been exposed to COVID-19. An infected person  can spread COVID-19 starting 48 hours (or 2 days) before the person has any symptoms or tests positive. By letting your close contacts know they may have been exposed to COVID-19, you are helping to protect everyone. See COVID-19 and Animals if you have questions about pets. If you are diagnosed with COVID-19, someone from the health department may call you. Answer the call to slow the spread. Monitor your symptoms Symptoms of COVID-19 include fever, cough, or other symptoms. Follow care instructions from your healthcare provider and local health department. Your local health authorities may give instructions on checking your symptoms and reporting information. When to seek emergency medical attention Look for emergency warning signs* for COVID-19. If someone is showing any of these signs, seek emergency medical care immediately: Trouble breathing Persistent pain or pressure in the chest New confusion Inability to wake or stay awake Pale, gray, or blue-colored skin, lips, or nail beds, depending on skin tone *This list is not all possible symptoms.  Please call your medical provider for any other symptoms that are severe or concerning to you. Call 911 or call ahead to your local emergency facility: Notify the operator that you are seeking care for someone who has or may have COVID-19. Call ahead before visiting your doctor Call ahead. Many medical visits for routine care are being postponed or done by phone or telemedicine. If you have a medical appointment that cannot be postponed, call your doctor's office, and tell them you have or may have COVID-19. This will help the office protect themselves and other patients. If you are sick, wear a well-fitting mask You should wear a mask if you must be around other people or animals, including pets (even at home). Wear a mask with the best fit, protection, and comfort for you. You don't need to wear the mask if you are alone. If you can't put on a mask (because of trouble breathing, for example), cover your coughs and sneezes in some other way. Try to stay at least 6 feet away from other people. This will help protect the people around you. Masks should not be placed on young children under age 20 years, anyone who has trouble breathing, or anyone who is not able to remove the mask without help. Cover your coughs and sneezes Cover your mouth and nose with a tissue when you cough or sneeze. Throw away used tissues in a lined trash can. Immediately wash your hands with soap and water for at least 20 seconds. If soap and water are not available, clean your hands with an alcohol-based hand sanitizer that contains at least 60% alcohol. Clean your hands often Wash your hands often with soap and water for at least 20 seconds. This is especially important after blowing your nose, coughing, or sneezing; going to the bathroom; and before eating or preparing food. Use hand sanitizer if soap and water are not available. Use an alcohol-based hand sanitizer with at least 60% alcohol, covering all surfaces of your  hands and rubbing them together until they feel dry. Soap and water are the best option, especially if hands are visibly dirty. Avoid touching your eyes, nose, and mouth with unwashed hands. Handwashing Tips Avoid sharing personal household items Do not share dishes, drinking glasses, cups, eating utensils, towels, or bedding with other people in your home. Wash these items thoroughly after using them with soap and water or put in the dishwasher. Clean surfaces in your home regularly Clean and disinfect high-touch surfaces (for example, doorknobs, tables,  handles, light switches, and countertops) in your "sick room" and bathroom. In shared spaces, you should clean and disinfect surfaces and items after each use by the person who is ill. If you are sick and cannot clean, a caregiver or other person should only clean and disinfect the area around you (such as your bedroom and bathroom) on an as needed basis. Your caregiver/other person should wait as long as possible (at least several hours) and wear a mask before entering, cleaning, and disinfecting shared spaces that you use. Clean and disinfect areas that may have blood, stool, or body fluids on them. Use household cleaners and disinfectants. Clean visible dirty surfaces with household cleaners containing soap or detergent. Then, use a household disinfectant. Use a product from [employer] List N: Disinfectants for Coronavirus (COVID-19). Be sure to follow the instructions on the label to ensure safe and effective use of the product. Many products recommend keeping the surface wet with a disinfectant for a certain period of time (look at "contact time" on the product label). You may also need to wear personal protective equipment, such as gloves, depending on the directions on the product label. Immediately after disinfecting, wash your hands with soap and water for 20 seconds. For completed guidance on cleaning and disinfecting your home, visit Complete  Disinfection Guidance. Take steps to improve ventilation at home Improve ventilation (air flow) at home to help prevent from spreading COVID-19 to other people in your household. Clear out COVID-19 virus particles in the air by opening windows, using air filters, and turning on fans in your home. Use this interactive tool to learn how to improve air flow in your home. When you can be around others after being sick with COVID-19 Deciding when you can be around others is different for different situations. Find out when you can safely end home isolation. For any additional questions about your care, contact your healthcare provider or state or local health department. 04/17/2020 Content source: [facility] ([facility]), Division of Viral Diseases This information is not intended to replace advice given to you by your health care provider. Make sure you discuss any questions you have with your health care provider. Document Revised: 05/31/2020 Document Reviewed: 05/31/2020 Elsevier Patient Education  2022 [employer].     If you have been instructed to have an in-person evaluation today at a local Urgent Care facility, please use the link below. It will take you to a list of all of our available [facility], including address, phone number and hours of operation. Please do not delay care.  [facility]  If you or a family member do not have a primary care provider, use the link below to schedule a visit and establish care. When you choose a [facility] primary care physician or advanced practice provider, you gain a long-term partner in health. Find a Primary Care Provider  Learn more about [facility]'s in-office and virtual care options: [facility] - Get Care Now

## 2022-09-24 NOTE — Progress Notes (Signed)
Deanna Gross,   Because of all the symptoms you have been having I would prefer you at least have a Video Visit if not an in person evaluation of your symptoms.    Thank you for the details you included in the comment boxes. Those details are very helpful in determining the best course of treatment for you and help [place] to provide the best care. We recommend that you convert this visit to a video visit in order for the provider to better assess what is going on.  The provider will be able to give you a more accurate diagnosis and treatment plan if we can more freely discuss your symptoms and with the addition of a virtual examination.   If you convert to a video visit, we will bill your insurance (similar to an office visit) and you will not be charged for this e-Visit. You will be able to stay at home and speak with the first available [facility] advanced practice provider. The link to do a video visit is in the drop down Menu tab of your Welcome screen in MyChart.    If you prefer here is a list of some local Urgent care offices  NOTE: There will be NO CHARGE for this eVisit   If you are having a true medical emergency please call 911.      For an urgent face to face visit, [facility] has eight urgent care centers for your convenience:   NEW!! [facility] Health Urgent Care Center at [facility] Get Driving Directions [phone] [address] [place], [address]    [facility] Health Urgent Care Center at [facility] Get Driving Directions [phone] [address] [place], [place] [address]   [facility] Health Urgent Care Center [facility]) Get Driving Directions [phone] [address] [place], [place] [address]  [facility] Health Urgent Care Center [facility]) Get Driving Directions [phone] [address] [place],  [place]  [address]  [facility] Health Urgent Care Center [facility] - at [employer]   [phone] [address] [place],  [place] [address]   [facility] Health Urgent Care at [facility] Get Driving Directions [phone] [address] [place], [place] [address]   [facility] Health Urgent Care at [facility] Get Driving Directions  [phone] [address] [place], [place] [address]   [facility] Health Urgent Care at [facility] Directions [phone] [address] [place], [place] [address]  Your MyChart E-visit questionnaire answers were reviewed by a board certified advanced clinical practitioner to complete your personal care plan based on your specific symptoms.  Thank you for using e-Visits.

## 2022-09-24 NOTE — Progress Notes (Signed)
Virtual Visit Consent   Deanna Gross, you are scheduled for a virtual visit with a [facility] provider today. Just as with appointments in the office, your consent must be obtained to participate. Your consent will be active for this visit and any virtual visit you may have with one of our providers in the next 365 days. If you have a MyChart account, a copy of this consent can be sent to you electronically.  As this is a virtual visit, video technology does not allow for your provider to perform a traditional examination. This may limit your provider's ability to fully assess your condition. If your provider identifies any concerns that need to be evaluated in person or the need to arrange testing (such as labs, EKG, etc.), we will make arrangements to do so. Although advances in technology are sophisticated, we cannot ensure that it will always work on either your end or our end. If the connection with a video visit is poor, the visit may have to be switched to a telephone visit. With either a video or telephone visit, we are not always able to ensure that we have a secure connection.  By engaging in this virtual visit, you consent to the provision of healthcare and authorize for your insurance to be billed (if applicable) for the services provided during this visit. Depending on your insurance coverage, you may receive a charge related to this service.  I need to obtain your verbal consent now. Are you willing to proceed with your visit today? Deanna Gross has provided verbal consent on 09/24/2022 for a virtual visit (video or telephone). Freddy Finner, NP  Date: 09/24/2022 2:30 PM  Virtual Visit via Video Note   I, Freddy Finner, connected with  Deanna Gross  ([id number], 02-May-1985) on 09/24/22 at  2:30 PM EDT by a video-enabled telemedicine application and verified that I am speaking with the correct person using two identifiers.  Location: Patient: Virtual Visit Location Patient:  Home Provider: Virtual Visit Location Provider: Home Office   I discussed the limitations of evaluation and management by telemedicine and the availability of in person appointments. The patient expressed understanding and agreed to proceed.    History of Present Illness: Deanna Gross is a 37 y.o. who identifies as a female who was assigned female at birth, and is being seen today for sinus infection   Onset was Sunday sore throat and drainage Associated symptoms are cough-mucus at times clear, fever 101.7 last night, temp 99.8, shortness of breath with coughing, and wheezing at times when laying flat Modifying factors are augmentin (left over from last sinus infection) has taken 3 doses, Tylenol and ibuprofen in rotating, afrin-  Denies chest pain   Exposure to sick contacts- known with recent COVID test: Covid +  Vaccines: yes   Problems:  Patient Active Problem List   Diagnosis Date Noted   Encounter for annual physical exam 09/25/2021   Anxiety 01/01/2021   Eczema 01/01/2021   BMI 40.0-44.9, adult (HCC) 01/01/2021   Mixed hyperlipidemia 01/01/2021   Generalized anxiety disorder 07/26/2019   HPV in female 05/22/2015   Allergic rhinitis 05/03/2015   Astigmatism 01/13/2012    Allergies:  Allergies  Allergen Reactions   Polytrim [Polymyxin B-Trimethoprim] Other (See Comments)    OPHTHALMIC - FACIAL/EYE SWELLING   Medications:  Current Outpatient Medications:    ALPRAZolam (XANAX) 0.5 MG tablet, Take 1/2 to 1 tablet by mouth 30 minutes prior to flying., Disp: 20 tablet,  Rfl: 2   fluticasone (FLONASE) 50 MCG/ACT nasal spray, Place 2 sprays into both nostrils daily., Disp: 16 g, Rfl: 6   ondansetron (ZOFRAN) 4 MG tablet, Take 1 tablet by mouth every 8  hours as needed for nausea or vomiting., Disp: 20 tablet, Rfl: 3   sertraline (ZOLOFT) 100 MG tablet, Take 1 tablet (100 mg total) by mouth daily., Disp: 90 tablet, Rfl: 3  Observations/Objective: Patient is well-developed,  well-nourished in no acute distress.  Resting comfortably  at home.  Head is normocephalic, atraumatic.  No labored breathing.  Speech is clear and coherent with logical content.  Patient is alert and oriented at baseline.  Cough- with audible rhonchi noted  Assessment and Plan:  1. COVID-19  - brompheniramine-pseudoephedrine-DM 30-2-10 MG/5ML syrup; Take 5 mLs by mouth 4 (four) times daily as needed.  Dispense: 120 mL; Refill: 0 - albuterol (VENTOLIN HFA) 108 (90 Base) MCG/ACT inhaler; Inhale 1-2 puffs into the lungs every 6 (six) hours as needed for wheezing or shortness of breath.  Dispense: 8 g; Refill: 0 - Fluticasone-Umeclidin-Vilant (TRELEGY ELLIPTA) 200-62.5-25 MCG/ACT AEPB; Inhale 1 puff into the lungs once for 1 dose.  Dispense: 1 each; Refill: 0   - Continue OTC symptomatic management of choice - Info for COVID sent on AVS as well - Take prescribed medications as directed - Push fluids - Rest as needed - Discussed return precautions and when to seek in-person evaluation, sent via AVS as well   Reviewed side effects, risks and benefits of medication.    Patient acknowledged agreement and understanding of the plan.   Past Medical, Surgical, Social History, Allergies, and Medications have been Reviewed.    Follow Up Instructions: I discussed the assessment and treatment plan with the patient. The patient was provided an opportunity to ask questions and all were answered. The patient agreed with the plan and demonstrated an understanding of the instructions.  A copy of instructions were sent to the patient via MyChart unless otherwise noted below.    The patient was advised to call back or seek an in-person evaluation if the symptoms worsen or if the condition fails to improve as anticipated.  Time:  I spent 10 minutes with the patient via telehealth technology discussing the above problems/concerns.    Freddy Finner, NP

## 2022-09-25 ENCOUNTER — Ambulatory Visit (HOSPITAL_COMMUNITY)
Admission: EM | Admit: 2022-09-25 | Discharge: 2022-09-25 | Disposition: A | Discharge status: Home / Self Care | Payer: 59 | Attending: Nurse Practitioner | Admitting: Nurse Practitioner

## 2022-09-25 ENCOUNTER — Encounter (HOSPITAL_COMMUNITY): Payer: Self-pay

## 2022-09-25 DIAGNOSIS — R062 Wheezing: Secondary | ICD-10-CM | POA: Diagnosis not present

## 2022-09-25 DIAGNOSIS — Z3202 Encounter for pregnancy test, result negative: Secondary | ICD-10-CM

## 2022-09-25 DIAGNOSIS — U071 COVID-19: Secondary | ICD-10-CM | POA: Diagnosis not present

## 2022-09-25 LAB — POCT URINE PREGNANCY: Preg Test, Ur: NEGATIVE

## 2022-09-25 MED ORDER — IPRATROPIUM-ALBUTEROL 0.5-2.5 (3) MG/3ML IN SOLN
RESPIRATORY_TRACT | Status: AC
  Filled 2022-09-25: qty 3

## 2022-09-25 MED ORDER — PROMETHAZINE-DM 6.25-15 MG/5ML PO SYRP: 5.0000 mL | ORAL_SOLUTION | Freq: Every evening | ORAL | 0 refills | Status: DC | PRN

## 2022-09-25 MED ORDER — METHYLPREDNISOLONE ACETATE 40 MG/ML IJ SUSP
INTRAMUSCULAR | Status: AC
  Filled 2022-09-25: qty 1

## 2022-09-25 MED ORDER — PAXLOVID (300/100) 20 X 150 MG & 10 X 100MG PO TBPK: 3.0000 | ORAL_TABLET | Freq: Two times a day (BID) | ORAL | 0 refills | Status: AC

## 2022-09-25 MED ORDER — METHYLPREDNISOLONE ACETATE 40 MG/ML IJ SUSP
40.0000 mg | Freq: Once | INTRAMUSCULAR | Status: AC
  Administered 2022-09-25: 40 mg via INTRAMUSCULAR

## 2022-09-25 MED ORDER — PREDNISONE 20 MG PO TABS
40.0000 mg | ORAL_TABLET | Freq: Every day | ORAL | 0 refills | Status: AC
Start: 2022-09-25 — End: 2022-09-30

## 2022-09-25 MED ORDER — IPRATROPIUM-ALBUTEROL 0.5-2.5 (3) MG/3ML IN SOLN
3.0000 mL | Freq: Once | RESPIRATORY_TRACT | Status: AC
  Administered 2022-09-25: 3 mL via RESPIRATORY_TRACT

## 2022-09-25 NOTE — Discharge Instructions (Signed)
Urine pregnancy test today is negative.  We have given you an injection of steroid and DuoNeb breathing treatment today.  Please continue the albuterol inhaler at home every 4-6 hours as needed for wheezing or shortness of breath.  Start the oral prednisone tomorrow morning, you can start the Paxlovid tonight.  There are no medication interactions on your list with the Paxlovid.  Also start taking Mucinex 600 mg twice daily, increase hydration with water and plenty of fluids.  Please follow-up in 48 hours if your symptoms have not significantly improved.  If your symptoms worsen, please be evaluated in the emergency room.

## 2022-09-25 NOTE — ED Provider Notes (Signed)
[facility]    CSN: [id number] Arrival date & time: 09/25/22  1430      History   Chief Complaint Chief Complaint  Patient presents with   Shortness of Breath   Cough   Covid Positive    HPI Deanna Gross is a 37 y.o. female.   Patient presents today with a 5-day history of fever, body aches and chills, cough, rib cage pain and back pain from coughing, shortness of breath, wheezing, chest and nasal congestion, runny nose, headache, decreased appetite, loss of taste and smell, and fatigue.  She denies chest pain or tightness, sore throat, ear pain, abdominal pain, nausea/vomiting, and diarrhea.  Reports she tested positive for COVID-19 4 days ago.  Has been taking cough syrup as prescribed by virtual visit yesterday and using albuterol inhaler more frequently than normal with some improvement in symptoms.    Past Medical History:  Diagnosis Date   Allergic rhinitis 05/03/2015   Anxiety    Asthma    exercise-induced; prn inhaler   Chronic infection of sinus 05/03/2015   Community acquired pneumonia 03/08/2021   Depression    on zoloft, doing well, cut dose.   Fibular avulsion fracture, ATFL sprain right ankle 11/16/2020   Fibular avulsion fracture, ATFL sprain right ankle 11/16/2020   Headache(784.0)    sinus   History of abnormal cervical Pap smear 07/26/2019   History of MRSA infection 2008   left leg   History of pre-eclampsia 07/26/2019   HPV in female    Infection    UTI   Insect bites 08/30/2013   IT band syndrome 05/03/2015   NSVD (normal spontaneous vaginal delivery) 09/17/2018   Obesity (BMI 30-39.9) 05/03/2015   PIH (pregnancy induced hypertension), third trimester 09/16/2018   Preeclampsia, severe, third trimester 09/16/2018   Recurrent knee pain 05/03/2015   Seasonal allergies    Subacute cough 04/29/2021   Surgical wound, non healing 08/2013   left breast   Tendinitis, de Quervain's 05/08/2017   Uses condoms for contraception 05/23/2015   Vaginal Pap smear,  abnormal     Patient Active Problem List   Diagnosis Date Noted   Encounter for annual physical exam 09/25/2021   Anxiety 01/01/2021   Eczema 01/01/2021   BMI 40.0-44.9, adult (HCC) 01/01/2021   Mixed hyperlipidemia 01/01/2021   Generalized anxiety disorder 07/26/2019   HPV in female 05/22/2015   Allergic rhinitis 05/03/2015   Astigmatism 01/13/2012    Past Surgical History:  Procedure Laterality Date   BREAST CYST EXCISION Left 03/18/2013   Procedure: EXCISE CYST LEFT MEDIAL BREAST;  Surgeon: Velora Heckler, MD;  Location: [facility];  Service: General;  Laterality: Left;   MASS EXCISION Left 09/01/2013   Procedure: RE-EXCISION NON HEALING WOUND LEFT BREAST;  Surgeon: Velora Heckler, MD;  Location: [facility];  Service: General;  Laterality: Left;   TONSILLECTOMY     TONSILLECTOMY AND ADENOIDECTOMY     TYMPANOSTOMY TUBE PLACEMENT     x 3   WISDOM TOOTH EXTRACTION      OB History     Gravida  1   Para  1   Term  1   Preterm  0   AB  0   Living  1      SAB  0   IAB  0   Ectopic  0   Multiple  0   Live Births  1  Home Medications    Prior to Admission medications   Medication Sig Start Date End Date Taking? Authorizing Provider  nirmatrelvir & ritonavir (PAXLOVID, 300/100,) 20 x 150 MG & 10 x 100MG  TBPK Take 3 tablets by mouth 2 (two) times daily for 5 days. 09/25/22 09/30/22 Yes Valentino Nose, NP  predniSONE (DELTASONE) 20 MG tablet Take 2 tablets (40 mg total) by mouth daily with breakfast for 5 days. 09/25/22 09/30/22 Yes Valentino Nose, NP  promethazine-dextromethorphan (PROMETHAZINE-DM) 6.25-15 MG/5ML syrup Take 5 mLs by mouth at bedtime as needed for cough. Do not take with alcohol or while driving or operating heavy machinery.  May cause drowsiness. 09/25/22  Yes Valentino Nose, NP  albuterol (VENTOLIN HFA) 108 (90 Base) MCG/ACT inhaler Inhale 1-2 puffs into the lungs every 6 (six) hours as needed for  wheezing or shortness of breath. 09/24/22   Freddy Finner, NP  ALPRAZolam Prudy Feeler) 0.5 MG tablet Take 1/2 to 1 tablet by mouth 30 minutes prior to flying. 09/24/21   Early, Sung Amabile, NP  fluticasone (FLONASE) 50 MCG/ACT nasal spray Place 2 sprays into both nostrils daily. 02/08/22   Viviano Simas, FNP  ondansetron (ZOFRAN) 4 MG tablet Take 1 tablet by mouth every 8  hours as needed for nausea or vomiting. 09/06/21   Early, Sung Amabile, NP  sertraline (ZOLOFT) 100 MG tablet Take 1 tablet (100 mg total) by mouth daily. 03/05/22   Early, Sung Amabile, NP    Family History Family History  Problem Relation Age of Onset   Hyperlipidemia Father    Hypertension Father    Depression Father    Hypothyroidism Mother    Thyroid disease Mother    Cancer Maternal Grandfather        leukemia   Cancer Paternal Grandfather        non-hodgkins lymphoma   Stroke Maternal Grandmother     Social History Social History   Tobacco Use   Smoking status: Never   Smokeless tobacco: Never  Vaping Use   Vaping status: Never Used  Substance Use Topics   Alcohol use: Not Currently    Comment: rarely   Drug use: No     Allergies   Polytrim [polymyxin b-trimethoprim]   Review of Systems Review of Systems Per HPI  Physical Exam Triage Vital Signs ED Triage Vitals  Encounter Vitals Group     BP 09/25/22 1440 126/74     Systolic BP Percentile --      Diastolic BP Percentile --      Pulse Rate 09/25/22 1440 85     Resp 09/25/22 1440 18     Temp 09/25/22 1440 99 F (37.2 C)     Temp Source 09/25/22 1440 Oral     SpO2 09/25/22 1440 97 %     Weight --      Height --      Head Circumference --      Peak Flow --      Pain Score 09/25/22 1443 0     Pain Loc --      Pain Education --      Exclude from Growth Chart --    No data found.  Updated Vital Signs BP 126/74 (BP Location: Right Arm)   Pulse 85   Temp 99 F (37.2 C) (Oral)   Resp 18   LMP 09/04/2022   SpO2 97%   Visual Acuity Right Eye  Distance:   Left Eye Distance:   Bilateral Distance:  Right Eye Near:   Left Eye Near:    Bilateral Near:     Physical Exam Vitals and nursing note reviewed.  Constitutional:      General: She is not in acute distress.    Appearance: Normal appearance. She is not ill-appearing or toxic-appearing.  HENT:     Head: Normocephalic and atraumatic.     Right Ear: Tympanic membrane, ear canal and external ear normal.     Left Ear: Tympanic membrane, ear canal and external ear normal.     Nose: No congestion or rhinorrhea.     Mouth/Throat:     Mouth: Mucous membranes are moist.     Pharynx: Oropharynx is clear. No oropharyngeal exudate or posterior oropharyngeal erythema.  Eyes:     General: No scleral icterus.    Extraocular Movements: Extraocular movements intact.  Cardiovascular:     Rate and Rhythm: Normal rate and regular rhythm.  Pulmonary:     Effort: Pulmonary effort is normal. Tachypnea present. No bradypnea, accessory muscle usage or respiratory distress.     Breath sounds: Wheezing present. No decreased breath sounds, rhonchi or rales.  Abdominal:     General: Abdomen is flat. Bowel sounds are normal. There is no distension.     Palpations: Abdomen is soft.  Musculoskeletal:     Cervical back: Normal range of motion and neck supple.  Skin:    General: Skin is warm and dry.     Coloration: Skin is not jaundiced or pale.     Findings: No erythema or rash.  Neurological:     Mental Status: She is alert and oriented to person, place, and time.     Motor: No weakness.  Psychiatric:        Behavior: Behavior is cooperative.      UC Treatments / Results  Labs (all labs ordered are listed, but only abnormal results are displayed) Labs Reviewed  POCT URINE PREGNANCY    EKG   Radiology No results found.  Procedures Procedures (including critical care time)  Medications Ordered in UC Medications  ipratropium-albuterol (DUONEB) 0.5-2.5 (3) MG/3ML nebulizer  solution 3 mL (3 mLs Nebulization Given 09/25/22 1506)  methylPREDNISolone acetate (DEPO-MEDROL) injection 40 mg (40 mg Intramuscular Given 09/25/22 1506)    Initial Impression / Assessment and Plan / UC Course  I have reviewed the triage vital signs and the nursing notes.  Pertinent labs & imaging results that were available during my care of the patient were reviewed by me and considered in my medical decision making (see chart for details).   Patient is well-appearing, normotensive, afebrile, not tachycardic, not tachypneic, oxygenating well on room air.    1. Positive self-administered antigen test for COVID-19 2. Wheezing Vitals and exam reassuring DuoNeb, IM Depo-Medrol given with improvement in wheezing and patient reported subjective improvement Start Paxlovid, promethazine-DM, and oral prednisone tomorrow to help with lung inflammation Start Mucinex Recommended follow-up in 48 hours with improvement in symptoms for recheck Strict return precautions discussed with patient  3. Urine pregnancy test negative    The patient was given the opportunity to ask questions.  All questions answered to their satisfaction.  The patient is in agreement to this plan.    Final Clinical Impressions(s) / UC Diagnoses   Final diagnoses:  Positive self-administered antigen test for COVID-19  Wheezing   Discharge Instructions   None    ED Prescriptions     Medication Sig Dispense Auth. Provider   nirmatrelvir & ritonavir (PAXLOVID, 300/100,) 20  x 150 MG & 10 x 100MG  TBPK Take 3 tablets by mouth 2 (two) times daily for 5 days. 30 tablet Cathlean Marseilles A, NP   predniSONE (DELTASONE) 20 MG tablet Take 2 tablets (40 mg total) by mouth daily with breakfast for 5 days. 10 tablet Cathlean Marseilles A, NP   promethazine-dextromethorphan (PROMETHAZINE-DM) 6.25-15 MG/5ML syrup Take 5 mLs by mouth at bedtime as needed for cough. Do not take with alcohol or while driving or operating heavy machinery.   May cause drowsiness. 118 mL Valentino Nose, NP      PDMP not reviewed this encounter.   Valentino Nose, NP 09/25/22 [phone]

## 2022-09-25 NOTE — ED Triage Notes (Addendum)
Patient reports that she tested Covid positive via home test. Patient states she checked her pulse ox when at registration and it was 93%. Patient states she has dropped to 87-89% at times.  Patient c/o a nonproductive cough, fatigue, and SOB x 4 days.  Patient states she took Tylenol at 1300 today, ibuprofen at 1100 and used her Albuterol inhaler 45 minutes ago.

## 2022-10-01 ENCOUNTER — Other Ambulatory Visit (HOSPITAL_BASED_OUTPATIENT_CLINIC_OR_DEPARTMENT_OTHER): Payer: Self-pay

## 2022-10-01 ENCOUNTER — Ambulatory Visit (INDEPENDENT_AMBULATORY_CARE_PROVIDER_SITE_OTHER): Payer: 59

## 2022-10-01 ENCOUNTER — Ambulatory Visit (HOSPITAL_BASED_OUTPATIENT_CLINIC_OR_DEPARTMENT_OTHER): Payer: 59 | Admitting: Family Medicine

## 2022-10-01 ENCOUNTER — Encounter (HOSPITAL_BASED_OUTPATIENT_CLINIC_OR_DEPARTMENT_OTHER): Payer: Self-pay | Admitting: Family Medicine

## 2022-10-01 ENCOUNTER — Ambulatory Visit (INDEPENDENT_AMBULATORY_CARE_PROVIDER_SITE_OTHER): Payer: 59 | Admitting: Family Medicine

## 2022-10-01 ENCOUNTER — Other Ambulatory Visit: Payer: Self-pay

## 2022-10-01 VITALS — BP 141/81 | HR 67 | Ht 63.0 in | Wt 238.0 lb

## 2022-10-01 DIAGNOSIS — R052 Subacute cough: Secondary | ICD-10-CM

## 2022-10-01 DIAGNOSIS — R0600 Dyspnea, unspecified: Secondary | ICD-10-CM | POA: Diagnosis not present

## 2022-10-01 DIAGNOSIS — R059 Cough, unspecified: Secondary | ICD-10-CM | POA: Diagnosis not present

## 2022-10-01 MED ORDER — HYDROCODONE BIT-HOMATROP MBR 5-1.5 MG/5ML PO SOLN
5.0000 mL | Freq: Four times a day (QID) | ORAL | 0 refills | Status: DC | PRN
  Filled 2022-10-01: qty 120, 6d supply, fill #0

## 2022-10-01 MED ORDER — IPRATROPIUM-ALBUTEROL 0.5-2.5 (3) MG/3ML IN SOLN
3.0000 mL | Freq: Four times a day (QID) | RESPIRATORY_TRACT | 7 refills | Status: AC | PRN
  Filled 2022-10-01: qty 90, 8d supply, fill #0

## 2022-10-01 NOTE — Progress Notes (Signed)
Acute Office Visit  Subjective:     Patient ID: Deanna Gross, female    DOB: 19-Sep-1985, 37 y.o.   MRN: [id number]  Chief Complaint  Patient presents with   Wheezing    Ongoing cough, SOB with cough, chest pressure associated with cough, productive cough clear mucus. Having to use inhaler a lot more   Deanna Gross is a 37 year-old female patient who presents today for ongoing cough.  Went to [facility] for fever, body aches/chills, cough, shob, wheezing and nasal congestion. Given DuoNeb & IM depo-medrol in office. "Inconclusive" covid test last week and treated for COVID. Completed Paxlovid, promethazine-DM, and PO prednisone. She has been taking Mucinex with sputum production. She reports she is still coughing, wheezing, shob with exertion.   Had similar symptoms- last year and the year before around the same month Sept 2022 Nov/Dec 2023 Again Sept 2024  Albuterol- Q4-6 hours, had a severe asthma attack this morning   Review of Systems  Constitutional:  Negative for chills, fever and malaise/fatigue.  Eyes:  Negative for blurred vision and double vision.  Respiratory:  Positive for cough, sputum production, shortness of breath and wheezing.   Cardiovascular:  Negative for chest pain, palpitations and leg swelling.  Gastrointestinal:  Negative for abdominal pain, nausea and vomiting.  Skin:  Negative for rash.  Neurological:  Negative for dizziness, weakness and headaches.  Psychiatric/Behavioral:  Negative for depression and suicidal ideas. The patient is not nervous/anxious and does not have insomnia.        Objective:    BP (!) 141/81   Pulse 67   Ht 5\' 3"  (1.6 m)   Wt 238 lb (108 kg)   LMP 09/04/2022   SpO2 95%   BMI 42.16 kg/m   Physical Exam Constitutional:      Appearance: Normal appearance.  HENT:     Mouth/Throat:     Mouth: Mucous membranes are moist.     Pharynx: Oropharynx is clear.  Cardiovascular:     Rate and Rhythm: Normal rate and regular rhythm.      Pulses: Normal pulses.     Heart sounds: Normal heart sounds.  Pulmonary:     Effort: Pulmonary effort is normal. No respiratory distress.     Breath sounds: Wheezing (with cough) present.  Neurological:     Mental Status: She is alert.  Psychiatric:        Mood and Affect: Mood normal.        Behavior: Behavior normal.        Thought Content: Thought content normal.        Judgment: Judgment normal.     Assessment & Plan:   1. Subacute cough Patient is a pleasant 37 year-old female patient who presents today for ongoing cough, dyspnea with exertion, and wheezing. She reports she has been using her Albuterol inhaler as prescribed but requiring it at least every 4-6 hours. Prescription sent for DuoNeb solution and cough syrup with hydrocodone. PDMP reviewed, no red flags present. Patient in no acute distress and is well-appearing. Denies chest pain, shortness of breath at rest, lower extremity edema, vision changes, headaches. Cardiovascular exam with heart regular rate and rhythm. Normal heart sounds, no murmurs present. No lower extremity edema present. Lungs clear to auscultation bilaterally. Wheezing present while patient has coughing episodes- occurring multiple times throughout the visit. Due to ongoing symptoms, will obtain CXR today. If pulmonary infection is present will treat accordingly. If no signs of acute infection, will place  urgent referral to pulmonology. Patient was referred to pulmonology by Shawna Clamp, NP in the past; however, her symptoms resolved when her scheduled appointment came. May also be reasonable to refer to allergy & asthma clinic.  - ipratropium-albuterol (DUONEB) 0.5-2.5 (3) MG/3ML SOLN; Take 3 mLs by nebulization every 6 (six) hours as needed.  Dispense: 3 mL; Refill: 7 - DG Chest 2 View; Future - HYDROcodone bit-homatropine (HYCODAN) 5-1.5 MG/5ML syrup; Take 5 mLs by mouth every 6 (six) hours as needed for cough.  Dispense: 120 mL; Refill:  0   Return if symptoms worsen or fail to improve.  Alyson Reedy, FNP

## 2022-10-02 ENCOUNTER — Encounter (HOSPITAL_COMMUNITY): Payer: Self-pay | Admitting: Family Medicine

## 2022-10-02 ENCOUNTER — Emergency Department (HOSPITAL_COMMUNITY): Payer: 59

## 2022-10-02 ENCOUNTER — Other Ambulatory Visit: Payer: Self-pay

## 2022-10-02 ENCOUNTER — Observation Stay (HOSPITAL_COMMUNITY)
Admission: EM | Admit: 2022-10-02 | Discharge: 2022-10-03 | Disposition: A | Discharge status: Home / Self Care | Payer: 59 | Attending: Emergency Medicine | Admitting: Emergency Medicine

## 2022-10-02 ENCOUNTER — Other Ambulatory Visit (HOSPITAL_BASED_OUTPATIENT_CLINIC_OR_DEPARTMENT_OTHER): Payer: Self-pay

## 2022-10-02 DIAGNOSIS — R054 Cough syncope: Secondary | ICD-10-CM | POA: Diagnosis present

## 2022-10-02 DIAGNOSIS — R55 Syncope and collapse: Secondary | ICD-10-CM | POA: Diagnosis not present

## 2022-10-02 DIAGNOSIS — Z1152 Encounter for screening for COVID-19: Secondary | ICD-10-CM | POA: Diagnosis not present

## 2022-10-02 DIAGNOSIS — J309 Allergic rhinitis, unspecified: Secondary | ICD-10-CM | POA: Diagnosis not present

## 2022-10-02 DIAGNOSIS — F419 Anxiety disorder, unspecified: Secondary | ICD-10-CM | POA: Diagnosis not present

## 2022-10-02 DIAGNOSIS — J45901 Unspecified asthma with (acute) exacerbation: Secondary | ICD-10-CM | POA: Diagnosis present

## 2022-10-02 DIAGNOSIS — Z79899 Other long term (current) drug therapy: Secondary | ICD-10-CM | POA: Diagnosis not present

## 2022-10-02 DIAGNOSIS — J4521 Mild intermittent asthma with (acute) exacerbation: Secondary | ICD-10-CM

## 2022-10-02 DIAGNOSIS — R0602 Shortness of breath: Secondary | ICD-10-CM | POA: Diagnosis not present

## 2022-10-02 DIAGNOSIS — Z6841 Body Mass Index (BMI) 40.0 and over, adult: Secondary | ICD-10-CM | POA: Insufficient documentation

## 2022-10-02 DIAGNOSIS — J4541 Moderate persistent asthma with (acute) exacerbation: Principal | ICD-10-CM | POA: Insufficient documentation

## 2022-10-02 LAB — CBC
HCT: 46 % (ref 36.0–46.0)
Hemoglobin: 15.3 g/dL — ABNORMAL HIGH (ref 12.0–15.0)
MCH: 28.6 pg (ref 26.0–34.0)
MCHC: 33.3 g/dL (ref 30.0–36.0)
MCV: 86 fL (ref 80.0–100.0)
Platelets: 433 10*3/uL — ABNORMAL HIGH (ref 150–400)
RBC: 5.35 MIL/uL — ABNORMAL HIGH (ref 3.87–5.11)
RDW: 13.2 % (ref 11.5–15.5)
WBC: 8.9 10*3/uL (ref 4.0–10.5)
nRBC: 0 % (ref 0.0–0.2)

## 2022-10-02 LAB — BASIC METABOLIC PANEL
Anion gap: 10 (ref 5–15)
BUN: 16 mg/dL (ref 6–20)
CO2: 23 mmol/L (ref 22–32)
Calcium: 8.9 mg/dL (ref 8.9–10.3)
Chloride: 103 mmol/L (ref 98–111)
Creatinine, Ser: 0.71 mg/dL (ref 0.44–1.00)
GFR, Estimated: >60 mL/min (ref >60)
Glucose, Bld: 99 mg/dL (ref 70–99)
Potassium: 3.5 mmol/L (ref 3.5–5.1)
Sodium: 136 mmol/L (ref 135–145)

## 2022-10-02 LAB — URINALYSIS, ROUTINE W REFLEX MICROSCOPIC
Bilirubin Urine: NEGATIVE
Glucose, UA: NEGATIVE mg/dL
Ketones, ur: NEGATIVE mg/dL
Leukocytes,Ua: NEGATIVE
Nitrite: NEGATIVE
Protein, ur: NEGATIVE mg/dL
RBC / HPF: >50 RBC/hpf (ref 0–5)
Specific Gravity, Urine: 1.018 (ref 1.005–1.030)
pH: 5 (ref 5.0–8.0)

## 2022-10-02 LAB — TROPONIN I (HIGH SENSITIVITY)
Troponin I (High Sensitivity): 6 ng/L (ref <18)
Troponin I (High Sensitivity): 4 ng/L (ref <18)

## 2022-10-02 LAB — HCG, SERUM, QUALITATIVE: Preg, Serum: NEGATIVE

## 2022-10-02 LAB — CBG MONITORING, ED: Glucose-Capillary: 119 mg/dL — ABNORMAL HIGH (ref 70–99)

## 2022-10-02 LAB — SARS CORONAVIRUS 2 BY RT PCR: SARS Coronavirus 2 by RT PCR: NEGATIVE

## 2022-10-02 MED ORDER — METHYLPREDNISOLONE SODIUM SUCC 40 MG IJ SOLR
40.0000 mg | Freq: Two times a day (BID) | INTRAMUSCULAR | Status: DC
  Administered 2022-10-03: 40 mg via INTRAVENOUS
  Filled 2022-10-02: qty 1

## 2022-10-02 MED ORDER — SERTRALINE HCL 100 MG PO TABS
100.0000 mg | ORAL_TABLET | Freq: Every day | ORAL | Status: DC
  Administered 2022-10-03: 100 mg via ORAL
  Filled 2022-10-02: qty 1

## 2022-10-02 MED ORDER — IPRATROPIUM-ALBUTEROL 0.5-2.5 (3) MG/3ML IN SOLN
9.0000 mL | Freq: Once | RESPIRATORY_TRACT | Status: AC
  Administered 2022-10-02: 9 mL via RESPIRATORY_TRACT
  Filled 2022-10-02: qty 3

## 2022-10-02 MED ORDER — DIPHENHYDRAMINE HCL 50 MG/ML IJ SOLN
25.0000 mg | Freq: Once | INTRAMUSCULAR | Status: AC
  Administered 2022-10-02: 25 mg via INTRAVENOUS
  Filled 2022-10-02: qty 1

## 2022-10-02 MED ORDER — METOCLOPRAMIDE HCL 5 MG/ML IJ SOLN
10.0000 mg | Freq: Once | INTRAMUSCULAR | Status: AC
  Administered 2022-10-02: 10 mg via INTRAVENOUS
  Filled 2022-10-02: qty 2

## 2022-10-02 MED ORDER — ALBUTEROL (5 MG/ML) CONTINUOUS INHALATION SOLN
10.0000 mg/h | INHALATION_SOLUTION | Freq: Once | RESPIRATORY_TRACT | Status: AC
  Administered 2022-10-02: 10 mg/h via RESPIRATORY_TRACT
  Filled 2022-10-02: qty 20

## 2022-10-02 MED ORDER — SODIUM CHLORIDE 0.9% FLUSH
3.0000 mL | Freq: Two times a day (BID) | INTRAVENOUS | Status: DC
  Administered 2022-10-02 – 2022-10-03 (×2): 3 mL via INTRAVENOUS

## 2022-10-02 MED ORDER — LACTATED RINGERS IV BOLUS
1000.0000 mL | Freq: Once | INTRAVENOUS | Status: AC
  Administered 2022-10-02: 1000 mL via INTRAVENOUS

## 2022-10-02 MED ORDER — ALBUTEROL SULFATE (2.5 MG/3ML) 0.083% IN NEBU: 2.5000 mg | INHALATION_SOLUTION | RESPIRATORY_TRACT | Status: DC | PRN

## 2022-10-02 MED ORDER — HYDROMORPHONE HCL 1 MG/ML IJ SOLN: 0.5000 mg | INTRAMUSCULAR | Status: DC | PRN

## 2022-10-02 MED ORDER — ALBUTEROL SULFATE (2.5 MG/3ML) 0.083% IN NEBU
INHALATION_SOLUTION | RESPIRATORY_TRACT | Status: AC
  Filled 2022-10-02: qty 12

## 2022-10-02 MED ORDER — LACTATED RINGERS IV SOLN: INTRAVENOUS | Status: AC

## 2022-10-02 MED ORDER — KETOROLAC TROMETHAMINE 15 MG/ML IJ SOLN
15.0000 mg | Freq: Once | INTRAMUSCULAR | Status: AC
  Administered 2022-10-02: 15 mg via INTRAMUSCULAR
  Filled 2022-10-02: qty 1

## 2022-10-02 MED ORDER — ENOXAPARIN SODIUM 60 MG/0.6ML IJ SOSY
0.5000 mg/kg | PREFILLED_SYRINGE | INTRAMUSCULAR | Status: DC
  Administered 2022-10-02: 55 mg via SUBCUTANEOUS
  Filled 2022-10-02: qty 0.6

## 2022-10-02 MED ORDER — SODIUM CHLORIDE 0.9 % IV BOLUS
1000.0000 mL | Freq: Once | INTRAVENOUS | Status: AC
  Administered 2022-10-02: 1000 mL via INTRAVENOUS

## 2022-10-02 MED ORDER — METHYLPREDNISOLONE SODIUM SUCC 125 MG IJ SOLR
125.0000 mg | Freq: Once | INTRAMUSCULAR | Status: AC
  Administered 2022-10-02: 125 mg via INTRAVENOUS
  Filled 2022-10-02: qty 2

## 2022-10-02 MED ORDER — ACETAMINOPHEN 325 MG PO TABS
650.0000 mg | ORAL_TABLET | Freq: Four times a day (QID) | ORAL | Status: DC | PRN
  Administered 2022-10-02 – 2022-10-03 (×2): 650 mg via ORAL
  Filled 2022-10-02 (×2): qty 2

## 2022-10-02 MED ORDER — LIDOCAINE 5 % EX PTCH
1.0000 | MEDICATED_PATCH | CUTANEOUS | Status: DC
  Administered 2022-10-02: 1 via TRANSDERMAL
  Filled 2022-10-02: qty 1

## 2022-10-02 MED ORDER — MAGNESIUM SULFATE 2 GM/50ML IV SOLN
2.0000 g | Freq: Once | INTRAVENOUS | Status: AC
  Administered 2022-10-02: 2 g via INTRAVENOUS
  Filled 2022-10-02: qty 50

## 2022-10-02 MED ORDER — POLYETHYLENE GLYCOL 3350 17 G PO PACK: 17.0000 g | PACK | Freq: Every day | ORAL | Status: DC | PRN

## 2022-10-02 MED ORDER — OXYCODONE HCL 5 MG PO TABS
5.0000 mg | ORAL_TABLET | ORAL | Status: DC | PRN
  Administered 2022-10-03: 5 mg via ORAL
  Filled 2022-10-02 (×2): qty 1

## 2022-10-02 MED ORDER — ONDANSETRON HCL 4 MG/2ML IJ SOLN: 4.0000 mg | Freq: Four times a day (QID) | INTRAMUSCULAR | Status: DC | PRN

## 2022-10-02 MED ORDER — IPRATROPIUM-ALBUTEROL 0.5-2.5 (3) MG/3ML IN SOLN
RESPIRATORY_TRACT | Status: AC
  Filled 2022-10-02: qty 9

## 2022-10-02 MED ORDER — IPRATROPIUM-ALBUTEROL 0.5-2.5 (3) MG/3ML IN SOLN
RESPIRATORY_TRACT | Status: AC
  Filled 2022-10-02: qty 6

## 2022-10-02 MED ORDER — ACETAMINOPHEN 650 MG RE SUPP: 650.0000 mg | Freq: Four times a day (QID) | RECTAL | Status: DC | PRN

## 2022-10-02 MED ORDER — ONDANSETRON HCL 4 MG PO TABS: 4.0000 mg | ORAL_TABLET | Freq: Four times a day (QID) | ORAL | Status: DC | PRN

## 2022-10-02 NOTE — ED Triage Notes (Signed)
Pt. Stated, I was on a call and all of a sudden I felt really hot, diaphoretic, and have had specific back pain slightly to the left and an awful headache and nearly passed out. I got off the call and ended up on the floor no sure if I LOC or nearly passed out. Lavenia Atlas been sick for 2 weeks with congestion, and cough. Went to UC and given steroids.I was tested positive for COVID.

## 2022-10-02 NOTE — ED Notes (Signed)
ED TO INPATIENT HANDOFF REPORT  ED Nurse Name and Phone #: Morrie Sheldon RN [phone]  S Name/Age/Gender Deanna Gross 37 y.o. female Room/Bed: 016C/016C  Code Status   Code Status: Full Code  Home/SNF/Other Home Patient oriented to: self, place, time, and situation Is this baseline? Yes   Triage Complete: Triage complete  Chief Complaint Asthma exacerbation [J45.901]  Triage Note Pt. Stated, I was on a call and all of a sudden I felt really hot, diaphoretic, and have had specific back pain slightly to the left and an awful headache and nearly passed out. I got off the call and ended up on the floor no sure if I LOC or nearly passed out. Lavenia Atlas been sick for 2 weeks with congestion, and cough. Went to UC and given steroids.I was tested positive for COVID.    Allergies Allergies  Allergen Reactions   Polytrim [Polymyxin B-Trimethoprim] Other (See Comments)    OPHTHALMIC - FACIAL/EYE SWELLING    Level of Care/Admitting Diagnosis ED Disposition     ED Disposition  Admit   Condition  --   Comment  Hospital Area: [facility] [[id number]]  Level of Care: Telemetry Medical [104]  May place patient in observation at [facility] or [facility] if equivalent level of care is available:: Yes  Covid Evaluation: Confirmed COVID Negative  Diagnosis: Asthma exacerbation [784696]  Admitting Physician: Briscoe Deutscher [[id number]]  Attending Physician: Briscoe Deutscher [[id number]]          B Medical/Surgery History Past Medical History:  Diagnosis Date   Allergic rhinitis 05/03/2015   Anxiety    Asthma    exercise-induced; prn inhaler   Chronic infection of sinus 05/03/2015   Community acquired pneumonia 03/08/2021   Depression    on zoloft, doing well, cut dose.   Fibular avulsion fracture, ATFL sprain right ankle 11/16/2020   Fibular avulsion fracture, ATFL sprain right ankle 11/16/2020   Headache(784.0)    sinus   History of abnormal cervical Pap smear 07/26/2019    History of MRSA infection 2008   left leg   History of pre-eclampsia 07/26/2019   HPV in female    Infection    UTI   Insect bites 08/30/2013   IT band syndrome 05/03/2015   NSVD (normal spontaneous vaginal delivery) 09/17/2018   Obesity (BMI 30-39.9) 05/03/2015   PIH (pregnancy induced hypertension), third trimester 09/16/2018   Preeclampsia, severe, third trimester 09/16/2018   Recurrent knee pain 05/03/2015   Seasonal allergies    Subacute cough 04/29/2021   Surgical wound, non healing 08/2013   left breast   Tendinitis, de Quervain's 05/08/2017   Uses condoms for contraception 05/23/2015   Vaginal Pap smear, abnormal    Past Surgical History:  Procedure Laterality Date   BREAST CYST EXCISION Left 03/18/2013   Procedure: EXCISE CYST LEFT MEDIAL BREAST;  Surgeon: Velora Heckler, MD;  Location: [facility];  Service: General;  Laterality: Left;   MASS EXCISION Left 09/01/2013   Procedure: RE-EXCISION NON HEALING WOUND LEFT BREAST;  Surgeon: Velora Heckler, MD;  Location: [facility];  Service: General;  Laterality: Left;   TONSILLECTOMY     TONSILLECTOMY AND ADENOIDECTOMY     TYMPANOSTOMY TUBE PLACEMENT     x 3   WISDOM TOOTH EXTRACTION       A IV Location/Drains/Wounds Patient Lines/Drains/Airways Status     Active Line/Drains/Airways     Name Placement date Placement time Site Days   Peripheral  IV 10/02/22 20 G Right Antecubital 10/02/22  1415  Antecubital  less than 1            Intake/Output Last 24 hours No intake or output data in the 24 hours ending 10/02/22 1927  Labs/Imaging Results for orders placed or performed during the hospital encounter of 10/02/22 (from the past 48 hour(s))  Basic metabolic panel     Status: None   Collection Time: 10/02/22 11:42 AM  Result Value Ref Range   Sodium 136 135 - 145 mmol/L   Potassium 3.5 3.5 - 5.1 mmol/L   Chloride 103 98 - 111 mmol/L   CO2 23 22 - 32 mmol/L   Glucose, Bld 99 70 - 99 mg/dL    Comment:  Glucose reference range applies only to samples taken after fasting for at least 8 hours.   BUN 16 6 - 20 mg/dL   Creatinine, Ser 6.04 0.44 - 1.00 mg/dL   Calcium 8.9 8.9 - 54.0 mg/dL   GFR, Estimated >98 >11 mL/min    Comment: (NOTE) Calculated using the CKD-EPI Creatinine Equation (2021)    Anion gap 10 5 - 15    Comment: Performed at [facility], [address] [place], [place] [address]  CBC     Status: Abnormal   Collection Time: 10/02/22 11:42 AM  Result Value Ref Range   WBC 8.9 4.0 - 10.5 K/uL   RBC 5.35 (H) 3.87 - 5.11 MIL/uL   Hemoglobin 15.3 (H) 12.0 - 15.0 g/dL   HCT 29.5 62.1 - 30.8 %   MCV 86.0 80.0 - 100.0 fL   MCH 28.6 26.0 - 34.0 pg   MCHC 33.3 30.0 - 36.0 g/dL    Comment: REPEATED TO VERIFY CORRECTED FOR COLD AGGLUTININS    RDW 13.2 11.5 - 15.5 %   Platelets 433 (H) 150 - 400 K/uL    Comment: REPEATED TO VERIFY   nRBC 0.0 0.0 - 0.2 %    Comment: Performed at [facility], [address], [place], [place] [address]  Urinalysis, Routine w reflex microscopic -Urine, Clean Catch     Status: Abnormal   Collection Time: 10/02/22 11:42 AM  Result Value Ref Range   Color, Urine YELLOW YELLOW   APPearance HAZY (A) CLEAR   Specific Gravity, Urine 1.018 1.005 - 1.030   pH 5.0 5.0 - 8.0   Glucose, UA NEGATIVE NEGATIVE mg/dL   Hgb urine dipstick LARGE (A) NEGATIVE   Bilirubin Urine NEGATIVE NEGATIVE   Ketones, ur NEGATIVE NEGATIVE mg/dL   Protein, ur NEGATIVE NEGATIVE mg/dL   Nitrite NEGATIVE NEGATIVE   Leukocytes,Ua NEGATIVE NEGATIVE   RBC / HPF >50 0 - 5 RBC/hpf   WBC, UA 6-10 0 - 5 WBC/hpf   Bacteria, UA RARE (A) NONE SEEN   Squamous Epithelial / HPF 0-5 0 - 5 /HPF   Mucus PRESENT     Comment: Performed at [facility], [address], [place], [place] [address]  hCG, serum, qualitative     Status: None   Collection Time: 10/02/22 11:42 AM  Result Value Ref Range   Preg, Serum NEGATIVE NEGATIVE    Comment:        THE SENSITIVITY OF  THIS METHODOLOGY IS >10 mIU/mL. Performed at [facility], [address], [place], [place] [address]   SARS Coronavirus 2 by RT PCR (hospital order, performed in [facility] hospital lab) *cepheid single result test* Anterior Nasal Swab  Status: None   Collection Time: 10/02/22 11:43 AM   Specimen: Anterior Nasal Swab  Result Value Ref Range   SARS Coronavirus 2 by RT PCR NEGATIVE NEGATIVE    Comment: Performed at [facility], [address] [place], [place] [address]  CBG monitoring, ED     Status: Abnormal   Collection Time: 10/02/22  1:32 PM  Result Value Ref Range   Glucose-Capillary 119 (H) 70 - 99 mg/dL    Comment: Glucose reference range applies only to samples taken after fasting for at least 8 hours.  Troponin I (High Sensitivity)     Status: None   Collection Time: 10/02/22  2:03 PM  Result Value Ref Range   Troponin I (High Sensitivity) 6 <18 ng/L    Comment: (NOTE) Elevated high sensitivity troponin I (hsTnI) values and significant  changes across serial measurements may suggest ACS but many other  chronic and acute conditions are known to elevate hsTnI results.  Refer to the "Links" section for chest pain algorithms and additional  guidance. Performed at [facility], [address], [place], [place] [address]   Troponin I (High Sensitivity)     Status: None   Collection Time: 10/02/22  4:05 PM  Result Value Ref Range   Troponin I (High Sensitivity) 4 <18 ng/L    Comment: (NOTE) Elevated high sensitivity troponin I (hsTnI) values and significant  changes across serial measurements may suggest ACS but many other  chronic and acute conditions are known to elevate hsTnI results.  Refer to the "Links" section for chest pain algorithms and additional  guidance. Performed at [facility], [address], [place], [place] [address]    DG Chest Port 1 View  Result Date: 10/02/2022 CLINICAL DATA:  Shortness of breath. EXAM: PORTABLE CHEST 1  VIEW COMPARISON:  Radiograph yesterday FINDINGS: Stable heart size allowing for differences in technique. Unchanged mediastinal contours. No focal airspace disease, large pleural effusion or pneumothorax. Stable osseous structures. IMPRESSION: No acute chest findings. Electronically Signed   By: Narda Rutherford M.D.   On: 10/02/2022 16:48   DG Chest 2 View  Result Date: 10/02/2022 CLINICAL DATA:  ongoing cough with dyspnea EXAM: CHEST - 2 VIEW COMPARISON:  March 2023 FINDINGS: The cardiomediastinal silhouette is within normal limits. No pleural effusion. No pneumothorax. No mass or consolidation. No acute osseous abnormality. IMPRESSION: No acute findings in the chest. Electronically Signed   By: Olive Bass M.D.   On: 10/02/2022 13:52    Pending Labs Unresulted Labs (From admission, onward)     Start     Ordered   10/09/22 0500  Creatinine, serum  (enoxaparin (LOVENOX)    CrCl >/= 30 ml/min)  Weekly,   R     Comments: while on enoxaparin therapy    10/02/22 1920   10/03/22 0500  HIV Antibody (routine testing w rflx)  (HIV Antibody (Routine testing w reflex) panel)  Tomorrow morning,   R        10/02/22 1920   10/03/22 0500  Basic metabolic panel  Daily,   R      10/02/22 1920   10/03/22 0500  CBC  Daily,   R      10/02/22 1920            Vitals/Pain Today's Vitals   10/02/22 1715 10/02/22 1720 10/02/22 1801 10/02/22 1830  BP: 111/69  105/70 123/84  Pulse:  (!) 134 (!) 108 (!) 103  Resp:  18 17   Temp:      TempSrc:      SpO2:   94% 92%  PainSc:        Isolation Precautions No active isolations  Medications Medications  lidocaine (LIDODERM) 5 % 1 patch (1 patch Transdermal Patch Applied 10/02/22 1415)  ipratropium-albuterol (DUONEB) 0.5-2.5 (3) MG/3ML nebulizer solution (  Not Given 10/02/22 1413)  ipratropium-albuterol (DUONEB) 0.5-2.5 (3) MG/3ML nebulizer solution (  Not Given 10/02/22 1416)  magnesium sulfate IVPB 2 g 50 mL (2 g Intravenous New Bag/Given 10/02/22 1902)   sertraline (ZOLOFT) tablet 100 mg (has no administration in time range)  enoxaparin (LOVENOX) injection 55 mg (has no administration in time range)  sodium chloride flush (NS) 0.9 % injection 3 mL (has no administration in time range)  acetaminophen (TYLENOL) tablet 650 mg (has no administration in time range)    Or  acetaminophen (TYLENOL) suppository 650 mg (has no administration in time range)  lactated ringers infusion (has no administration in time range)  oxyCODONE (Oxy IR/ROXICODONE) immediate release tablet 5 mg (has no administration in time range)  HYDROmorphone (DILAUDID) injection 0.5 mg (has no administration in time range)  polyethylene glycol (MIRALAX / GLYCOLAX) packet 17 g (has no administration in time range)  ondansetron (ZOFRAN) tablet 4 mg (has no administration in time range)    Or  ondansetron (ZOFRAN) injection 4 mg (has no administration in time range)  albuterol (PROVENTIL) (2.5 MG/3ML) 0.083% nebulizer solution 2.5 mg (has no administration in time range)  methylPREDNISolone sodium succinate (SOLU-MEDROL) 40 mg/mL injection 40 mg (has no administration in time range)  ipratropium-albuterol (DUONEB) 0.5-2.5 (3) MG/3ML nebulizer solution 9 mL (9 mLs Nebulization Given 10/02/22 1415)  lactated ringers bolus 1,000 mL (0 mLs Intravenous Stopped 10/02/22 1621)  ketorolac (TORADOL) 15 MG/ML injection 15 mg (15 mg Intramuscular Given 10/02/22 1412)  albuterol (PROVENTIL,VENTOLIN) solution continuous neb (10 mg/hr Nebulization Given 10/02/22 1524)  sodium chloride 0.9 % bolus 1,000 mL (1,000 mLs Intravenous New Bag/Given 10/02/22 1837)  methylPREDNISolone sodium succinate (SOLU-MEDROL) 125 mg/2 mL injection 125 mg (125 mg Intravenous Given 10/02/22 1857)  metoCLOPramide (REGLAN) injection 10 mg (10 mg Intravenous Given 10/02/22 1852)  diphenhydrAMINE (BENADRYL) injection 25 mg (25 mg Intravenous Given 10/02/22 1855)    Mobility walks     Focused Assessments Pulmonary Assessment  Handoff:  Lung sounds: Bilateral Breath Sounds: Expiratory wheezes O2 Device: Room Air      R Recommendations: See Admitting Provider Note  Report given to:   Additional Notes:

## 2022-10-02 NOTE — ED Notes (Signed)
Awaiting patient from lobby 

## 2022-10-02 NOTE — ED Provider Notes (Signed)
  Physical Exam  BP 123/84   Pulse (!) 103   Temp 98.2 F (36.8 C) (Oral)   Resp 17   LMP 09/29/2022   SpO2 92%   Physical Exam  Procedures  Procedures  ED Course / MDM    Medical Decision Making Care assumed at 3 PM.  Patient has history of asthma.  Patient tested positive for COVID little over a week ago.  Patient went to urgent care and just finished a course of steroid 2 days ago. Patient is here with a syncopal episode and wheezing.  Continuous neb was ordered and signed out pending reassessment  7:16 PM Patient's labs were unremarkable and COVID test is negative.  Chest x-ray did not show any pneumonia.  Unfortunately patient became very tachycardic with continuous neb so the neb had to be stopped.  Patient also received Solu-Medrol and magnesium but is still wheezing.  At this point patient will be admitted for asthma exacerbation.  Problems Addressed: Moderate persistent asthma with exacerbation: acute illness or injury  Amount and/or Complexity of Data Reviewed Labs: ordered. Decision-making details documented in ED Course. Radiology: ordered.  Risk Prescription drug management.          Charlynne Pander, MD 10/02/22 [phone]

## 2022-10-02 NOTE — H&P (Signed)
History and Physical    Deanna Gross UJW:[id number] DOB: Oct 26, 1985 DOA: 10/02/2022  PCP: Hilbert Bible, FNP   Patient coming from: Home   Chief Complaint: Cough, wheezing, syncope  HPI: Deanna Gross is a pleasant 37 y.o. female with medical history significant for asthma, anxiety, BMI 42, and recent COVID-19 infection who presents to the ED with ongoing cough, wheezing, and syncope.   Patient developed sinus congestion and cough cough roughly 2 weeks ago and was started on Augmentin for sinusitis.  She reports taking 3 COVID tests which were "inconclusive."  She continued to have her upper respiratory symptoms, developed shortness of breath and wheezing, and was seen in urgent care where Augmentin was stopped, she was given steroid injection, and sent home with 5 days of prednisone and breathing treatments.    She completed the prednisone 2 days ago but continues to have wheezing, shortness of breath, and malaise.  She was on a conference call for work today when she developed acute onset of severe pain in her left flank with nausea, diaphoresis, and then brief loss of consciousness.  She does have a history of syncope.  She also has a history of asthma that was persistent in childhood but now intermittent with only rare exacerbation. The back pain has completely resolved.   ED Course: Upon arrival to the ED, patient is found to be afebrile and saturating well on room air with elevated heart rate and stable blood pressure.  EKG demonstrates sinus rhythm with sinus arrhythmia.  Chest x-ray negative for acute findings.  Labs are most notable for negative COVID-19 PCR, normal troponin x 2, normal WBC, and normal renal function.  Patient was given 2 L of IV fluids, Toradol, Benadryl, Reglan, IV magnesium, IV Solu-Medrol, albuterol, and DuoNebs in the ED.  Review of Systems:  All other systems reviewed and apart from HPI, are negative.  Past Medical History:  Diagnosis Date   Allergic  rhinitis 05/03/2015   Anxiety    Asthma    exercise-induced; prn inhaler   Chronic infection of sinus 05/03/2015   Community acquired pneumonia 03/08/2021   Depression    on zoloft, doing well, cut dose.   Fibular avulsion fracture, ATFL sprain right ankle 11/16/2020   Fibular avulsion fracture, ATFL sprain right ankle 11/16/2020   Headache(784.0)    sinus   History of abnormal cervical Pap smear 07/26/2019   History of MRSA infection 2008   left leg   History of pre-eclampsia 07/26/2019   HPV in female    Infection    UTI   Insect bites 08/30/2013   IT band syndrome 05/03/2015   NSVD (normal spontaneous vaginal delivery) 09/17/2018   Obesity (BMI 30-39.9) 05/03/2015   PIH (pregnancy induced hypertension), third trimester 09/16/2018   Preeclampsia, severe, third trimester 09/16/2018   Recurrent knee pain 05/03/2015   Seasonal allergies    Subacute cough 04/29/2021   Surgical wound, non healing 08/2013   left breast   Tendinitis, de Quervain's 05/08/2017   Uses condoms for contraception 05/23/2015   Vaginal Pap smear, abnormal     Past Surgical History:  Procedure Laterality Date   BREAST CYST EXCISION Left 03/18/2013   Procedure: EXCISE CYST LEFT MEDIAL BREAST;  Surgeon: Velora Heckler, MD;  Location: [facility];  Service: General;  Laterality: Left;   MASS EXCISION Left 09/01/2013   Procedure: RE-EXCISION NON HEALING WOUND LEFT BREAST;  Surgeon: Velora Heckler, MD;  Location:  SUR[facility]ERY C[facility];  Service: General;  Laterality: Left;   TONSILLECTOMY     TONSILLECTOMY AND ADENOIDECTOMY     TYMPANOSTOMY TUBE PLACEMENT     x 3   WISDOM TOOTH EXTRACTION      Social History:   reports that she has never smoked. She has never used smokeless tobacco. She reports that she does not currently use alcohol. She reports that she does not use drugs.  Allergies  Allergen Reactions   Polytrim [Polymyxin B-Trimethoprim] Other (See Comments)    OPHTHALMIC - FACIAL/EYE SWELLING     Family History  Problem Relation Age of Onset   Hyperlipidemia Father    Hypertension Father    Depression Father    Hypothyroidism Mother    Thyroid disease Mother    Cancer Maternal Grandfather        leukemia   Cancer Paternal Grandfather        non-hodgkins lymphoma   Stroke Maternal Grandmother      Prior to Admission medications   Medication Sig Start Date End Date Taking? Authorizing Provider  albuterol (VENTOLIN HFA) 108 (90 Base) MCG/ACT inhaler Inhale 1-2 puffs into the lungs every 6 (six) hours as needed for wheezing or shortness of breath. 09/24/22  Yes Freddy Finner, NP  fluticasone (FLONASE) 50 MCG/ACT nasal spray Place 2 sprays into both nostrils daily. 02/08/22  Yes Viviano Simas, FNP  ipratropium-albuterol (DUONEB) 0.5-2.5 (3) MG/3ML SOLN Take 3 mLs by nebulization every 6 (six) hours as needed. Patient taking differently: Take 3 mLs by nebulization every 6 (six) hours as needed (For shortness of breath). 10/01/22  Yes Alyson Reedy, FNP  ondansetron (ZOFRAN) 4 MG tablet Take 1 tablet by mouth every 8  hours as needed for nausea or vomiting. 09/06/21  Yes Early, Sung Amabile, NP  sertraline (ZOLOFT) 100 MG tablet Take 1 tablet (100 mg total) by mouth daily. 03/05/22  Yes EarlySung Amabile, NP    Physical Exam: Vitals:   10/02/22 1715 10/02/22 1720 10/02/22 1801 10/02/22 1830  BP: 111/69  105/70 123/84  Pulse:  (!) 134 (!) 108 (!) 103  Resp:  18 17   Temp:      TempSrc:      SpO2:   94% 92%     Constitutional: NAD, calm  Eyes: PERTLA, lids and conjunctivae normal ENMT: Mucous membranes are moist. Posterior pharynx clear of any exudate or lesions.   Neck: supple, no masses  Respiratory: Prolonged expiratory phase with wheezes. Speaking full sentences.  Cardiovascular: S1 & S2 heard, regular rate and rhythm. No extremity edema.   Abdomen: No distension, no tenderness, soft. Bowel sounds active.  Musculoskeletal: no clubbing / cyanosis. No joint deformity upper and  lower extremities.   Skin: no significant rashes, lesions, ulcers. Warm, dry, well-perfused. Neurologic: CN 2-12 grossly intact. Moving all extremities. Alert and oriented.  Psychiatric: Pleasant. Cooperative.    Labs and Imaging on Admission: I have personally reviewed following labs and imaging studies  CBC: Recent Labs  Lab 10/02/22 1142  WBC 8.9  HGB 15.3*  HCT 46.0  MCV 86.0  PLT 433*   Basic Metabolic Panel: Recent Labs  Lab 10/02/22 1142  NA 136  K 3.5  CL 103  CO2 23  GLUCOSE 99  BUN 16  CREATININE 0.71  CALCIUM 8.9   GFR: Estimated Creatinine Clearance: 112.3 mL/min (by C-G formula based on SCr of 0.71 mg/dL). Liver Function Tests: No results for input(s): "AST", "ALT", "ALKPHOS", "BILITOT", "PROT", "ALBUMIN" in the last 168 hours. No  results for input(s): "LIPASE", "AMYLASE" in the last 168 hours. No results for input(s): "AMMONIA" in the last 168 hours. Coagulation Profile: No results for input(s): "INR", "PROTIME" in the last 168 hours. Cardiac Enzymes: No results for input(s): "CKTOTAL", "CKMB", "CKMBINDEX", "TROPONINI" in the last 168 hours. BNP (last 3 results) No results for input(s): "PROBNP" in the last 8760 hours. HbA1C: No results for input(s): "HGBA1C" in the last 72 hours. CBG: Recent Labs  Lab 10/02/22 1332  GLUCAP 119*   Lipid Profile: No results for input(s): "CHOL", "HDL", "LDLCALC", "TRIG", "CHOLHDL", "LDLDIRECT" in the last 72 hours. Thyroid Function Tests: No results for input(s): "TSH", "T4TOTAL", "FREET4", "T3FREE", "THYROIDAB" in the last 72 hours. Anemia Panel: No results for input(s): "VITAMINB12", "FOLATE", "FERRITIN", "TIBC", "IRON", "RETICCTPCT" in the last 72 hours. Urine analysis:    Component Value Date/Time   COLORURINE YELLOW 10/02/2022 1142   APPEARANCEUR HAZY (A) 10/02/2022 1142   LABSPEC 1.018 10/02/2022 1142   PHURINE 5.0 10/02/2022 1142   GLUCOSEU NEGATIVE 10/02/2022 1142   HGBUR LARGE (A) 10/02/2022 1142    BILIRUBINUR NEGATIVE 10/02/2022 1142   KETONESUR NEGATIVE 10/02/2022 1142   PROTEINUR NEGATIVE 10/02/2022 1142   NITRITE NEGATIVE 10/02/2022 1142   LEUKOCYTESUR NEGATIVE 10/02/2022 1142   Sepsis Labs: @LABRCNTIP (procalcitonin:4,lacticidven:4) ) Recent Results (from the past 240 hour(s))  SARS Coronavirus 2 by RT PCR (hospital order, performed in [facility] hospital lab) *cepheid single result test* Anterior Nasal Swab     Status: None   Collection Time: 10/02/22 11:43 AM   Specimen: Anterior Nasal Swab  Result Value Ref Range Status   SARS Coronavirus 2 by RT PCR NEGATIVE NEGATIVE Final    Comment: Performed at [name], [address] [place], [place] [address]     Radiological Exams on Admission: DG Chest Port 1 View  Result Date: 10/02/2022 CLINICAL DATA:  Shortness of breath. EXAM: PORTABLE CHEST 1 VIEW COMPARISON:  Radiograph yesterday FINDINGS: Stable heart size allowing for differences in technique. Unchanged mediastinal contours. No focal airspace disease, large pleural effusion or pneumothorax. Stable osseous structures. IMPRESSION: No acute chest findings. Electronically Signed   By: Narda Rutherford M.D.   On: 10/02/2022 16:48   DG Chest 2 View  Result Date: 10/02/2022 CLINICAL DATA:  ongoing cough with dyspnea EXAM: CHEST - 2 VIEW COMPARISON:  March 2023 FINDINGS: The cardiomediastinal silhouette is within normal limits. No pleural effusion. No pneumothorax. No mass or consolidation. No acute osseous abnormality. IMPRESSION: No acute findings in the chest. Electronically Signed   By: Olive Bass M.D.   On: 10/02/2022 13:52    EKG: Independently reviewed. Sinus rhythm with sinus arrhythmia.   Assessment/Plan  1. Acute asthma exacerbation  - Given CAT, DuoNeb, IV Solu-Medrol and IV magnesium in ED  - Continue systemic steroid and as-needed albuterol    2. Syncope   - She has hx of syncope and this episode consistent with neurally-mediated reflex syncope   - Continue supportive care     3. Anxiety  - Continue Zoloft     DVT prophylaxis: Lovenox  Code Status: Full  Level of Care: Level of care: Telemetry Medical Family Communication: None present   Disposition Plan:  Patient is from: home  Anticipated d/c is to: Home  Anticipated d/c date is: 9/6 or 10/04/22  Patient currently: Pending improved/stable respiratory status, cardiac monitoring  Consults called: None  Admission status: Observation     Briscoe Deutscher, MD [facility]  10/02/2022, 7:20 PM

## 2022-10-03 DIAGNOSIS — J309 Allergic rhinitis, unspecified: Secondary | ICD-10-CM | POA: Diagnosis not present

## 2022-10-03 DIAGNOSIS — J4541 Moderate persistent asthma with (acute) exacerbation: Secondary | ICD-10-CM | POA: Diagnosis not present

## 2022-10-03 DIAGNOSIS — Z79899 Other long term (current) drug therapy: Secondary | ICD-10-CM | POA: Diagnosis not present

## 2022-10-03 DIAGNOSIS — J4521 Mild intermittent asthma with (acute) exacerbation: Secondary | ICD-10-CM | POA: Diagnosis not present

## 2022-10-03 DIAGNOSIS — R55 Syncope and collapse: Secondary | ICD-10-CM | POA: Diagnosis not present

## 2022-10-03 DIAGNOSIS — Z6841 Body Mass Index (BMI) 40.0 and over, adult: Secondary | ICD-10-CM | POA: Diagnosis not present

## 2022-10-03 DIAGNOSIS — F419 Anxiety disorder, unspecified: Secondary | ICD-10-CM | POA: Diagnosis not present

## 2022-10-03 DIAGNOSIS — Z1152 Encounter for screening for COVID-19: Secondary | ICD-10-CM | POA: Diagnosis not present

## 2022-10-03 LAB — BASIC METABOLIC PANEL
Anion gap: 9 (ref 5–15)
BUN: 14 mg/dL (ref 6–20)
CO2: 25 mmol/L (ref 22–32)
Calcium: 9 mg/dL (ref 8.9–10.3)
Chloride: 105 mmol/L (ref 98–111)
Creatinine, Ser: 0.69 mg/dL (ref 0.44–1.00)
GFR, Estimated: >60 mL/min (ref >60)
Glucose, Bld: 160 mg/dL — ABNORMAL HIGH (ref 70–99)
Potassium: 3.7 mmol/L (ref 3.5–5.1)
Sodium: 139 mmol/L (ref 135–145)

## 2022-10-03 LAB — CBC
HCT: 34.8 % — ABNORMAL LOW (ref 36.0–46.0)
Hemoglobin: 12.1 g/dL (ref 12.0–15.0)
MCH: 30.6 pg (ref 26.0–34.0)
MCHC: 34.8 g/dL (ref 30.0–36.0)
MCV: 87.9 fL (ref 80.0–100.0)
Platelets: 399 10*3/uL (ref 150–400)
RBC: 3.96 MIL/uL (ref 3.87–5.11)
RDW: 13.5 % (ref 11.5–15.5)
WBC: 9.2 10*3/uL (ref 4.0–10.5)
nRBC: 0 % (ref 0.0–0.2)

## 2022-10-03 LAB — HIV ANTIBODY (ROUTINE TESTING W REFLEX): HIV Screen 4th Generation wRfx: NONREACTIVE

## 2022-10-03 MED ORDER — FLUTICASONE-SALMETEROL 115-21 MCG/ACT IN AERO
2.0000 | INHALATION_SPRAY | Freq: Two times a day (BID) | RESPIRATORY_TRACT | 12 refills | Status: DC
  Filled 2022-10-09: qty 12, 30d supply, fill #0

## 2022-10-03 MED ORDER — DOXYCYCLINE HYCLATE 100 MG PO TABS
100.0000 mg | ORAL_TABLET | Freq: Two times a day (BID) | ORAL | 0 refills | Status: AC
Start: 2022-10-03 — End: 2022-10-08

## 2022-10-03 MED ORDER — PREDNISONE 10 MG PO TABS: 40.0000 mg | ORAL_TABLET | Freq: Every day | ORAL | 0 refills | Status: AC

## 2022-10-03 NOTE — Discharge Summary (Signed)
Physician Discharge Summary  LAKIN STATES JWJ:[id number] DOB: 02/04/1985 DOA: 10/02/2022  PCP: Hilbert Bible, FNP  Admit date: 10/02/2022 Discharge date: 10/03/2022  Admitted From: Home  Discharge disposition: Home  Recommendations for Outpatient Follow-Up:   Follow up with your primary care provider in one week.  Check CBC, BMP, magnesium in the next visit  Discharge Diagnosis:   Principal Problem:   Asthma exacerbation Active Problems:   Anxiety   Syncope, vasovagal   Discharge Condition: Improved.  Diet recommendation: Regular.  Wound care: None.  Code status: Full.   History of Present Illness:   Deanna Gross is a pleasant 37 years old female with past medical history significant for asthma, anxiety, obesity with BMI of 42, and recent COVID-19 infection presented to the ED with cough for 2 weeks and was recently treated with Augmentin and prednisone.  She continued to have ongoing cough, wheezing despite 2 days of prednisone with shortness of breath malaise, and  subsequently had syncope with pain in the left flank.  In the ED patient was afebrile.  EKG with sinus arrhythmia.  Chest x-ray was negative for infiltrate.  Labs were unremarkable.  COVID-19 PCR was negative.  Troponins were negative.  Patient was given 2 L of IV fluid Toradol, Benadryl, Reglan, IV magnesium, IV Solu-Medrol, albuterol, and DuoNebs in the ED and was considered for admission to hospital for further evaluation and treatment.Marland Kitchen  Hospital Course:   Following conditions were addressed during hospitalization as listed below,   Acute asthma exacerbation  Patient received DuoNebs, Solu-Medrol, IV magnesium in the ED. feels better.  On IV steroids and DuoNebs.  Will transition to oral prednisone for next 5 days, add inhaled steroid with Advair, advised to use a spacer at home with albuterol.  Patient stated that she has an appointment to follow-up with pulmonary and ENT as outpatient.  Patient  states that she was getting better but then when she went back to work it started to get worse again..  She feels like mold might be contributing.  I have encouraged her to discuss about changing the workplace and she has a different building that she is planning to work at.  Add empiric doxycycline on discharge.  Allergic rhinitis, postnasal drip.  Likely contributing to her cough.  No wheezing at this time.  Already on Flonase at home.  She is scheduled to follow-up with ENT as outpatient.  Will be on empiric doxycycline for possible bronchitis/sinusitis on discharge.   Syncope   - She has hx of syncope and this episode consistent with neurally-mediated reflex syncope.  Otherwise asymptomatic at this time.   Anxiety  - Continue Zoloft    Morbid obesity. Body mass index is 41.98 kg/m.  Would benefit from ongoing weight loss as outpatient.   Disposition.  At this time, patient is stable for disposition home with outpatient PCP, ENT and pulmonary follow-up  Medical Consultants:   None.  Procedures:    None Subjective:   Today, patient was seen and examined at bedside.  Feels better than yesterday.  No wheezing but has sinus congestion and postnasal drip.  Continues to have dry cough.  Wishes to go home.  Discharge Exam:   Vitals:   10/03/22 0423 10/03/22 0738  BP: 129/87 134/86  Pulse: 82 72  Resp: 18 18  Temp: 97.9 F (36.6 C) 97.8 F (36.6 C)  SpO2: 94% 93%   Vitals:   10/02/22 2000 10/03/22 0013 10/03/22 0423 10/03/22 0738  BP:  127/73 137/73 129/87 134/86  Pulse: (!) 108 (!) 106 82 72  Resp: 18 18 18 18   Temp: 98.1 F (36.7 C) 98.1 F (36.7 C) 97.9 F (36.6 C) 97.8 F (36.6 C)  TempSrc: Oral Oral Oral Oral  SpO2: 95% 94% 94% 93%  Weight:   107.5 kg    Body mass index is 41.98 kg/m.  General: Alert awake, not in obvious distress, morbidly obese, able to communicate well.  On room air HENT: pupils equally reacting to light,  No scleral pallor or icterus noted.  Oral mucosa is moist.  Mild sinus tenderness noted. Chest:   Diminished breath sounds bilaterally, no wheezes CVS: S1 &S2 heard. No murmur.  Regular rate and rhythm. Abdomen: Soft, nontender, nondistended.  Bowel sounds are heard.   Extremities: No cyanosis, clubbing or edema.  Peripheral pulses are palpable. Psych: Alert, awake and oriented, normal mood CNS:  No cranial nerve deficits.  Power equal in all extremities.   Skin: Warm and dry.  No rashes noted.  The results of significant diagnostics from this hospitalization (including imaging, microbiology, ancillary and laboratory) are listed below for reference.     Diagnostic Studies:   DG Chest Port 1 View  Result Date: 10/02/2022 CLINICAL DATA:  Shortness of breath. EXAM: PORTABLE CHEST 1 VIEW COMPARISON:  Radiograph yesterday FINDINGS: Stable heart size allowing for differences in technique. Unchanged mediastinal contours. No focal airspace disease, large pleural effusion or pneumothorax. Stable osseous structures. IMPRESSION: No acute chest findings. Electronically Signed   By: Narda Rutherford M.D.   On: 10/02/2022 16:48     Labs:   Basic Metabolic Panel: Recent Labs  Lab 10/02/22 1142 10/03/22 0553  NA 136 139  K 3.5 3.7  CL 103 105  CO2 23 25  GLUCOSE 99 160*  BUN 16 14  CREATININE 0.71 0.69  CALCIUM 8.9 9.0   GFR Estimated Creatinine Clearance: 112 mL/min (by C-G formula based on SCr of 0.69 mg/dL). Liver Function Tests: No results for input(s): "AST", "ALT", "ALKPHOS", "BILITOT", "PROT", "ALBUMIN" in the last 168 hours. No results for input(s): "LIPASE", "AMYLASE" in the last 168 hours. No results for input(s): "AMMONIA" in the last 168 hours. Coagulation profile No results for input(s): "INR", "PROTIME" in the last 168 hours.  CBC: Recent Labs  Lab 10/02/22 1142 10/03/22 0553  WBC 8.9 9.2  HGB 15.3* 12.1  HCT 46.0 34.8*  MCV 86.0 87.9  PLT 433* 399   Cardiac Enzymes: No results for input(s):  "CKTOTAL", "CKMB", "CKMBINDEX", "TROPONINI" in the last 168 hours. BNP: Invalid input(s): "POCBNP" CBG: Recent Labs  Lab 10/02/22 1332  GLUCAP 119*   D-Dimer No results for input(s): "DDIMER" in the last 72 hours. Hgb A1c No results for input(s): "HGBA1C" in the last 72 hours. Lipid Profile No results for input(s): "CHOL", "HDL", "LDLCALC", "TRIG", "CHOLHDL", "LDLDIRECT" in the last 72 hours. Thyroid function studies No results for input(s): "TSH", "T4TOTAL", "T3FREE", "THYROIDAB" in the last 72 hours.  Invalid input(s): "FREET3" Anemia work up No results for input(s): "VITAMINB12", "FOLATE", "FERRITIN", "TIBC", "IRON", "RETICCTPCT" in the last 72 hours. Microbiology Recent Results (from the past 240 hour(s))  SARS Coronavirus 2 by RT PCR (hospital order, performed in [facility] hospital lab) *cepheid single result test* Anterior Nasal Swab     Status: None   Collection Time: 10/02/22 11:43 AM   Specimen: Anterior Nasal Swab  Result Value Ref Range Status   SARS Coronavirus 2 by RT PCR NEGATIVE NEGATIVE Final  Comment: Performed at [facility], [address] [place], [place] [address]     Discharge Instructions:   Discharge Instructions     Call MD for:  temperature >100.4   Complete by: As directed    Diet general   Complete by: As directed    Discharge instructions   Complete by: As directed    Follow-up with your primary care provider in 1 week.  Continue to take medications including prednisone and antibiotic as prescribed.  Please avoid mold in current working environment if possible.  Use mask if possible.  Do not overexert.  Keep the appointment with ENT and pulmonary as outpatient.  Seek medical attention for worsening symptoms.   Increase activity slowly   Complete by: As directed       Allergies as of 10/03/2022       Reactions   Polytrim [polymyxin B-trimethoprim] Other (See Comments)   OPHTHALMIC - FACIAL/EYE SWELLING        Medication  List     TAKE these medications    albuterol 108 (90 Base) MCG/ACT inhaler Commonly known as: VENTOLIN HFA Inhale 1-2 puffs into the lungs every 6 (six) hours as needed for wheezing or shortness of breath.   doxycycline 100 MG tablet Commonly known as: VIBRA-TABS Take 1 tablet (100 mg total) by mouth 2 (two) times daily for 5 days.   fluticasone 50 MCG/ACT nasal spray Commonly known as: FLONASE Place 2 sprays into both nostrils daily.   fluticasone-salmeterol 115-21 MCG/ACT inhaler Commonly known as: ADVAIR HFA Inhale 2 puffs into the lungs 2 (two) times daily.   ipratropium-albuterol 0.5-2.5 (3) MG/3ML Soln Commonly known as: DUONEB Take 3 mLs by nebulization every 6 (six) hours as needed. What changed: reasons to take this   ondansetron 4 MG tablet Commonly known as: ZOFRAN Take 1 tablet by mouth every 8  hours as needed for nausea or vomiting.   predniSONE 10 MG tablet Commonly known as: DELTASONE Take 4 tablets (40 mg total) by mouth daily with breakfast for 5 days.   sertraline 100 MG tablet Commonly known as: ZOLOFT Take 1 tablet (100 mg total) by mouth daily.        Follow-up Information     Haywood Lasso Shelton Silvas, FNP Follow up in 1 week(s).   Specialty: Family Medicine Contact information: [address] [place] [address] [phone]                  Time coordinating discharge: 39 minutes  Signed:  Raela Bohl  [facility] 10/03/2022, 2:09 PM

## 2022-10-03 NOTE — Progress Notes (Signed)
Transition of Care [facility]) - Inpatient Brief Assessment   Patient Details  Name: Deanna Gross MRN: [id number] Date of Birth: September 27, 1985  Transition of [facility]) CM/SW Contact:    Janae Bridgeman, RN Phone Number: 10/03/2022, 11:26 AM   Clinical Narrative: Patient admitted for asthma exacerbation.  No TOC needs at this time.  Patient is discharging home today.   Transition of Care Asessment: Insurance and Status: (P) Insurance coverage has been reviewed Patient has primary care physician: (P) Yes Home environment has been reviewed: (P) From home Prior level of function:: (P) Independent Prior/Current Home Services: (P) No current home services Social Determinants of Health Reivew: (P) SDOH reviewed no interventions necessary Readmission risk has been reviewed: (P) Yes Transition of care needs: (P) no transition of care needs at this time

## 2022-10-06 ENCOUNTER — Telehealth (HOSPITAL_BASED_OUTPATIENT_CLINIC_OR_DEPARTMENT_OTHER): Payer: Self-pay

## 2022-10-06 NOTE — Transitions of Care (Post Inpatient/ED Visit) (Unsigned)
   10/06/2022  Name: Deanna Gross MRN: [id number] DOB: [birth year]  Today's TOC FU Call Status: Today's TOC FU Call Status:: Unsuccessful Call (1st Attempt) Unsuccessful Call (1st Attempt) Date: 10/06/22  Attempted to reach the patient regarding the most recent Inpatient/ED visit.  Follow Up Plan: Additional outreach attempts will be made to reach the patient to complete the Transitions of Care (Post Inpatient/ED visit) call.   Signature Karena Addison, LPN [facility] Nurse Health Advisor Direct Dial [phone]

## 2022-10-08 ENCOUNTER — Other Ambulatory Visit (HOSPITAL_COMMUNITY): Payer: Self-pay

## 2022-10-08 NOTE — Transitions of Care (Post Inpatient/ED Visit) (Signed)
10/08/2022  Name: Deanna Gross MRN: [id number] DOB: 12/06/1985  Today's TOC FU Call Status: Today's TOC FU Call Status:: Successful TOC FU Call Completed Unsuccessful Call (1st Attempt) Date: 10/06/22 [facility] FU Call Complete Date: 10/08/22 Patient's Name and Date of Birth confirmed.  Transition Care Management Follow-up Telephone Call Date of Discharge: 10/03/22 Discharge Facility: [facility]) Type of Discharge: Inpatient Admission Primary Inpatient Discharge Diagnosis:: asthma How have you been since you were released from the hospital?: Better Any questions or concerns?: No  Items Reviewed: Did you receive and understand the discharge instructions provided?: No Medications obtained,verified, and reconciled?: Yes (Medications Reviewed) Any new allergies since your discharge?: No Dietary orders reviewed?: Yes Do you have support at home?: Yes People in Home: spouse  Medications Reviewed Today: Medications Reviewed Today     Reviewed by Karena Addison, LPN (Licensed Practical Nurse) on 10/08/22 at 1441  Med List Status: <None>   Medication Order Taking? Sig Documenting Provider Last Dose Status Informant  albuterol (VENTOLIN HFA) 108 (90 Base) MCG/ACT inhaler [id number] No Inhale 1-2 puffs into the lungs every 6 (six) hours as needed for wheezing or shortness of breath. Freddy Finner, NP 10/01/2022 Active Self  doxycycline (VIBRA-TABS) 100 MG tablet [id number]  Take 1 tablet (100 mg total) by mouth 2 (two) times daily for 5 days. Pokhrel, Laxman, MD  Active   fluticasone (FLONASE) 50 MCG/ACT nasal spray [id number] No Place 2 sprays into both nostrils daily. Viviano Simas, FNP 10/01/2022 Active Self  fluticasone-salmeterol (ADVAIR HFA) 623-76 MCG/ACT inhaler [id number]  Inhale 2 puffs into the lungs 2 (two) times daily. Pokhrel, Laxman, MD  Active   ipratropium-albuterol (DUONEB) 0.5-2.5 (3) MG/3ML SOLN [id number] No Take 3 mLs by nebulization every 6 (six) hours as needed.   Patient taking differently: Take 3 mLs by nebulization every 6 (six) hours as needed (For shortness of breath).   Alyson Reedy, FNP 10/02/2022 Active Self  ondansetron (ZOFRAN) 4 MG tablet [id number] No Take 1 tablet by mouth every 8  hours as needed for nausea or vomiting. Tollie Eth, NP Past Month Active Self           Med Note (S[place], Genoveva Ill   Thu Oct 02, 2022  5:17 PM) Still has on hand as needed per patient    predniSONE (DELTASONE) 10 MG tablet [id number]  Take 4 tablets (40 mg total) by mouth daily with breakfast for 5 days. Pokhrel, Laxman, MD  Active   sertraline (ZOLOFT) 100 MG tablet [id number] No Take 1 tablet (100 mg total) by mouth daily. Tollie Eth, NP 10/01/2022 Active Self            Home Care and Equipment/Supplies: Were Home Health Services Ordered?: NA Any new equipment or medical supplies ordered?: NA  Functional Questionnaire: Do you need assistance with bathing/showering or dressing?: No Do you need assistance with meal preparation?: No Do you need assistance with eating?: No Do you have difficulty maintaining continence: No Do you need assistance with getting out of bed/getting out of a chair/moving?: No Do you have difficulty managing or taking your medications?: No  Follow up appointments reviewed: PCP Follow-up appointment confirmed?: Yes Date of PCP follow-up appointment?: 10/13/22 Follow-up Provider: caudle Specialist [facility] Follow-up appointment confirmed?: No Reason Specialist Follow-Up Not Confirmed:  (referral done by [facility] NP, patient hasn't heard anything back) Do you need transportation to your follow-up appointment?: No Do you understand care options if your condition(s) worsen?: Yes-patient verbalized understanding  SIGNATURE Karena Addison, LPN [facility] Nurse Health Advisor Direct Dial [phone]

## 2022-10-09 ENCOUNTER — Other Ambulatory Visit (HOSPITAL_COMMUNITY): Payer: Self-pay

## 2022-10-10 ENCOUNTER — Encounter (HOSPITAL_BASED_OUTPATIENT_CLINIC_OR_DEPARTMENT_OTHER): Payer: Self-pay | Admitting: Pulmonary Disease

## 2022-10-10 ENCOUNTER — Ambulatory Visit (HOSPITAL_BASED_OUTPATIENT_CLINIC_OR_DEPARTMENT_OTHER): Payer: 59 | Admitting: Pulmonary Disease

## 2022-10-10 ENCOUNTER — Other Ambulatory Visit (HOSPITAL_BASED_OUTPATIENT_CLINIC_OR_DEPARTMENT_OTHER): Payer: Self-pay

## 2022-10-10 VITALS — BP 112/72 | HR 83 | Resp 16 | Ht 63.0 in | Wt 233.8 lb

## 2022-10-10 DIAGNOSIS — R053 Chronic cough: Secondary | ICD-10-CM

## 2022-10-10 DIAGNOSIS — J4520 Mild intermittent asthma, uncomplicated: Secondary | ICD-10-CM

## 2022-10-10 DIAGNOSIS — R062 Wheezing: Secondary | ICD-10-CM

## 2022-10-10 MED ORDER — IPRATROPIUM BROMIDE 0.03 % NA SOLN
2.0000 | Freq: Two times a day (BID) | NASAL | 5 refills | Status: AC
  Filled 2022-10-10: qty 30, 75d supply, fill #0
  Filled 2023-03-09: qty 30, 75d supply, fill #1
  Filled 2023-06-11: qty 30, 75d supply, fill #2
  Filled 2023-09-13: qty 30, 75d supply, fill #3

## 2022-10-10 MED ORDER — OMEPRAZOLE 20 MG PO CPDR
20.0000 mg | DELAYED_RELEASE_CAPSULE | Freq: Every day | ORAL | 2 refills | Status: DC
  Filled 2022-10-10: qty 30, 30d supply, fill #0
  Filled 2022-12-15: qty 30, 30d supply, fill #1

## 2022-10-10 NOTE — Progress Notes (Unsigned)
Subjective:   PATIENT ID: Deanna Gross GENDER: female DOB: 12/12/1985, MRN: [id number]  Chief Complaint  Patient presents with   Consult    Reason for Visit: New consult for cough  Ms. Deanna Gross is a 37 year old female never smoker with childhood asthma, allergic rhinitis, anxiety, HLD who presents for evaluation for cough and wheezing.  She was previously diagnosed with childhood asthma and only on PRN albuterol as an adult. Has never been on maintenance inhalers. Began having annual bronchitis in the last three years in late summer/fall. Has chronic allergies requiring xyzal daily and flonase nightly. Mold and fresh cut grass triggers her allergies. Initially her cough starts nonproductive and dry that becomes productive gets treated with steroids. Feels chest tightness R>L that prevents full breath. Has had wheezing during night and activity. Associated with coughing. Reports reflux associated with stress. Cough improved cold air.  Social History: Grew up in restored farmhouse in early [place]s during childhood Fireplace and coal stove for heating at 18 years  I have personally reviewed patient's past medical/family/social history, allergies, current medications.  Past Medical History:  Diagnosis Date   Allergic rhinitis 05/03/2015   Anxiety    Asthma    exercise-induced; prn inhaler   Chronic infection of sinus 05/03/2015   Community acquired pneumonia 03/08/2021   Depression    on zoloft, doing well, cut dose.   Fibular avulsion fracture, ATFL sprain right ankle 11/16/2020   Fibular avulsion fracture, ATFL sprain right ankle 11/16/2020   Headache(784.0)    sinus   History of abnormal cervical Pap smear 07/26/2019   History of MRSA infection 2008   left leg   History of pre-eclampsia 07/26/2019   HPV in female    Infection    UTI   Insect bites 08/30/2013   IT band syndrome 05/03/2015   NSVD (normal spontaneous vaginal delivery) 09/17/2018   Obesity (BMI 30-39.9) 05/03/2015    PIH (pregnancy induced hypertension), third trimester 09/16/2018   Preeclampsia, severe, third trimester 09/16/2018   Recurrent knee pain 05/03/2015   Seasonal allergies    Subacute cough 04/29/2021   Surgical wound, non healing 08/2013   left breast   Tendinitis, de Quervain's 05/08/2017   Uses condoms for contraception 05/23/2015   Vaginal Pap smear, abnormal      Family History  Problem Relation Age of Onset   Hyperlipidemia Father    Hypertension Father    Depression Father    Hypothyroidism Mother    Thyroid disease Mother    Cancer Maternal Grandfather        leukemia   Cancer Paternal Grandfather        non-hodgkins lymphoma   Stroke Maternal Grandmother      Social History   Occupational History   Not on file  Tobacco Use   Smoking status: Never    Passive exposure: Never   Smokeless tobacco: Never  Vaping Use   Vaping status: Never Used  Substance and Sexual Activity   Alcohol use: Not Currently    Comment: rarely   Drug use: No   Sexual activity: Yes    Birth control/protection: None    Allergies  Allergen Reactions   Polytrim [Polymyxin B-Trimethoprim] Other (See Comments)    OPHTHALMIC - FACIAL/EYE SWELLING     Outpatient Medications Prior to Visit  Medication Sig Dispense Refill   albuterol (VENTOLIN HFA) 108 (90 Base) MCG/ACT inhaler Inhale 1-2 puffs into the lungs every 6 (six) hours as needed for wheezing  or shortness of breath. 8 g 0   Cholecalciferol (VITAMIN D3) 1.25 MG (50000 UT) CAPS Take 1 capsule by mouth daily.     fluticasone (FLONASE) 50 MCG/ACT nasal spray Place 2 sprays into both nostrils daily. 16 g 6   levocetirizine (XYZAL) 5 MG tablet Take 5 mg by mouth every evening.     ondansetron (ZOFRAN) 4 MG tablet Take 1 tablet by mouth every 8  hours as needed for nausea or vomiting. 20 tablet 3   sertraline (ZOLOFT) 100 MG tablet Take 1 tablet (100 mg total) by mouth daily. 90 tablet 3   fluticasone-salmeterol (ADVAIR HFA) 115-21 MCG/ACT  inhaler Inhale 2 puffs into the lungs 2 (two) times daily. (Patient not taking: Reported on 10/10/2022) 12 g 12   ipratropium-albuterol (DUONEB) 0.5-2.5 (3) MG/3ML SOLN Take 3 mLs by nebulization every 6 (six) hours as needed. (Patient not taking: Reported on 10/10/2022) 90 mL 7   No facility-administered medications prior to visit.    Review of Systems  Constitutional:  Negative for chills, diaphoresis, fever, malaise/fatigue and weight loss.  HENT:  Negative for congestion.   Respiratory:  Positive for cough, sputum production and wheezing. Negative for hemoptysis and shortness of breath.   Cardiovascular:  Positive for chest pain (chest tightness). Negative for palpitations and leg swelling.  Endo/Heme/Allergies:  Positive for environmental allergies.     Objective:   Vitals:   10/10/22 0912  BP: 112/72  Pulse: 83  Resp: 16  SpO2: 98%  Weight: 233 lb 12.8 oz (106.1 kg)  Height: 5\' 3"  (1.6 m)   SpO2: 98 %  Physical Exam: General: Well-appearing, no acute distress HENT: [place], AT Eyes: EOMI, no scleral icterus Respiratory: Clear to auscultation bilaterally.  No crackles, wheezing or rales Cardiovascular: RRR, -M/R/G, no JVD Extremities:-Edema,-tenderness Neuro: AAO x4, CNII-XII grossly intact Psych: Normal mood, normal affect  Data Reviewed:  Imaging: CXR 10/02/22 - no infiltrates, effusion, edema  PFT: None of file  Labs: CBC    Component Value Date/Time   WBC 9.2 10/03/2022 0553   RBC 3.96 10/03/2022 0553   HGB 12.1 10/03/2022 0553   HGB 13.9 08/15/2022 0837   HCT 34.8 (L) 10/03/2022 0553   HCT 43.5 08/15/2022 0837   PLT 399 10/03/2022 0553   PLT 269 08/15/2022 0837   MCV 87.9 10/03/2022 0553   MCV 87 08/15/2022 0837   MCH 30.6 10/03/2022 0553   MCHC 34.8 10/03/2022 0553   RDW 13.5 10/03/2022 0553   RDW 13.8 08/15/2022 0837   LYMPHSABS 2.2 08/15/2022 0837   MONOABS 0.7 07/29/2018 2305   EOSABS 0.1 08/15/2022 0837   BASOSABS 0.0 08/15/2022 0837         Assessment & Plan:   Discussion: 37 year old female never smoker with childhood asthma, allergic rhinitis, anxiety, HLD who presents for evaluation for cough and wheezing.  Common causes of cough were discussed including upper airway cough syndrome, reflux and undiagnosed obstructive lung disease. We reviewed evaluation and management for cough as noted below. Discussed clinical course and management of asthma including bronchodilator regimen, preventive care including vaccinations and action plan for exacerbation.  Chronic cough Mild intermittent asthma Allergic rhinitis --START Advair HFA 115-21 mcg TWO puffs in the morning and evening. Rinse mouth out after use --ARRANGE for pulmonary function tests --Allergies: Continue xyzal. START atrovent in the morning and afternoon as needed. Continue flonase nightly --Reflux: START omeprazole 40 mg once a day. Buy over-the-counter. Diet recommendations printed   Health Maintenance Immunization History  Administered Date(s) Administered   Influenza,inj,Quad PF,6+ Mos 11/12/2018, 12/06/2020   Influenza-Unspecified 10/14/2017, 09/27/2021   PFIZER(Purple Top)SARS-COV-2 Vaccination 02/16/2019, 03/09/2019, 01/26/2020   Tdap 01/27/2009, 07/02/2018   CT Lung Screen - never smoker. Not qualified  Orders Placed This Encounter  Procedures   Pulmonary function test    Standing Status:   Future    Standing Expiration Date:   10/10/2023    Scheduling Instructions:     Pre and post spirometry only    Order Specific Question:   Where should this test be performed?    Answer:   [facility]   Meds ordered this encounter  Medications   omeprazole (PRILOSEC) 20 MG capsule    Sig: Take 1 capsule (20 mg total) by mouth daily.    Dispense:  30 capsule    Refill:  2   ipratropium (ATROVENT) 0.03 % nasal spray    Sig: Place 2 sprays into both nostrils every 12 (twelve) hours.    Dispense:  30 mL    Refill:  5    Return in about 3 months  (around 01/09/2023).  I have spent a total time of 45-minutes on the day of the appointment reviewing prior documentation, coordinating care and discussing medical diagnosis and plan with the patient/family. Imaging, labs and tests included in this note have been reviewed and interpreted independently by me.  Diem Dicocco Mechele Collin, MD [facility] 10/10/2022 9:23 AM  Office Number [phone]

## 2022-10-10 NOTE — Patient Instructions (Signed)
Chronic cough Mild intermittent asthma Allergic rhinitis --START Advair HFA 115-21 mcg TWO puffs in the morning and evening. Rinse mouth out after use --ARRANGE for pulmonary function tests --Allergies: Continue xyzal. START atrovent in the morning and afternoon as needed. Continue flonase nightly --Reflux: START omeprazole 40 mg once a day. Buy over-the-counter. Diet recommendations printed

## 2022-10-13 ENCOUNTER — Ambulatory Visit (INDEPENDENT_AMBULATORY_CARE_PROVIDER_SITE_OTHER): Payer: 59 | Admitting: Family Medicine

## 2022-10-13 ENCOUNTER — Encounter (HOSPITAL_BASED_OUTPATIENT_CLINIC_OR_DEPARTMENT_OTHER): Payer: Self-pay | Admitting: Family Medicine

## 2022-10-13 VITALS — BP 129/89 | HR 79 | Ht 63.0 in | Wt 236.0 lb

## 2022-10-13 DIAGNOSIS — Z09 Encounter for follow-up examination after completed treatment for conditions other than malignant neoplasm: Secondary | ICD-10-CM | POA: Diagnosis not present

## 2022-10-13 DIAGNOSIS — J309 Allergic rhinitis, unspecified: Secondary | ICD-10-CM

## 2022-10-13 DIAGNOSIS — J4521 Mild intermittent asthma with (acute) exacerbation: Secondary | ICD-10-CM | POA: Diagnosis not present

## 2022-10-13 NOTE — Patient Instructions (Signed)
Discuss possible compound sinus rinses.

## 2022-10-13 NOTE — Progress Notes (Addendum)
Subjective:   Deanna Gross 01-22-1986 10/13/2022  Chief Complaint  Patient presents with   Hospitalization Follow-up    Pt was recently in the hospital. Pt said she is improving each day and has seen pulmonary for a consultation and had medications added to help with her symptoms. States she is still congested in her nose and still has some SOB.    HPI: Deanna Gross presents today for re-assessment and management of chronic medical conditions.  Patient is here for posthospital follow-up from admission on October 02, 2022 with moderate persistent asthma with exacerbation.  Patient went to the ER due to recent COVID infection and was having a severe cough, wheezing and had a syncopal episode.  She had been given Augmentin, steroid injection, prednisone taper and breathing treatments without improvement.  Her chest x-ray was negative, troponin levels normal, COVID-19 was negative, she had normal white counts and normal renal function.  Patient was managed with DuoNebs, Solu-Medrol and magnesium and had significant improvement in her symptoms.  She was discharged and has a follow-up with pulmonology on September 13.  She was started on Advair HFA twice daily, Atrovent nasal spray twice daily as needed, and omeprazole 40 mg daily.  She was recommended to follow-up with ENT and allergist for further trigger testing and management of chronic allergic rhinitis.  She is also following up with pulmonology to complete pulmonary function testing.  She states she is still having moderate sinus congestion and is taking Xyzal and Flonase as well.  She denies any acute exacerbation symptoms at this time.  The following portions of the patient's history were reviewed and updated as appropriate: past medical history, past surgical history, family history, social history, allergies, medications, and problem list.   Patient Active Problem List   Diagnosis Date Noted   Asthma exacerbation 10/02/2022    Syncope, vasovagal 10/02/2022   Subacute cough 10/01/2022   Encounter for annual physical exam 09/25/2021   Anxiety 01/01/2021   Eczema 01/01/2021   BMI 40.0-44.9, adult (HCC) 01/01/2021   Mixed hyperlipidemia 01/01/2021   Generalized anxiety disorder 07/26/2019   HPV in female 05/22/2015   Allergic rhinitis 05/03/2015   Astigmatism 01/13/2012   Past Medical History:  Diagnosis Date   Allergic rhinitis 05/03/2015   Anxiety    Asthma    exercise-induced; prn inhaler   Chronic infection of sinus 05/03/2015   Community acquired pneumonia 03/08/2021   Depression    on zoloft, doing well, cut dose.   Fibular avulsion fracture, ATFL sprain right ankle 11/16/2020   Fibular avulsion fracture, ATFL sprain right ankle 11/16/2020   Headache(784.0)    sinus   History of abnormal cervical Pap smear 07/26/2019   History of MRSA infection 2008   left leg   History of pre-eclampsia 07/26/2019   HPV in female    Infection    UTI   Insect bites 08/30/2013   IT band syndrome 05/03/2015   NSVD (normal spontaneous vaginal delivery) 09/17/2018   Obesity (BMI 30-39.9) 05/03/2015   PIH (pregnancy induced hypertension), third trimester 09/16/2018   Preeclampsia, severe, third trimester 09/16/2018   Recurrent knee pain 05/03/2015   Seasonal allergies    Subacute cough 04/29/2021   Surgical wound, non healing 08/2013   left breast   Tendinitis, de Quervain's 05/08/2017   Uses condoms for contraception 05/23/2015   Vaginal Pap smear, abnormal    Past Surgical History:  Procedure Laterality Date   BREAST CYST EXCISION Left 03/18/2013  Procedure: EXCISE CYST LEFT MEDIAL BREAST;  Surgeon: Velora Heckler, MD;  Location: [facility];  Service: General;  Laterality: Left;   MASS EXCISION Left 09/01/2013   Procedure: RE-EXCISION NON HEALING WOUND LEFT BREAST;  Surgeon: Velora Heckler, MD;  Location: [facility];  Service: General;  Laterality: Left;   TONSILLECTOMY     TONSILLECTOMY AND  ADENOIDECTOMY     TYMPANOSTOMY TUBE PLACEMENT     x 3   WISDOM TOOTH EXTRACTION     Family History  Problem Relation Age of Onset   Hyperlipidemia Father    Hypertension Father    Depression Father    Hypothyroidism Mother    Thyroid disease Mother    Cancer Maternal Grandfather        leukemia   Cancer Paternal Grandfather        non-hodgkins lymphoma   Stroke Maternal Grandmother    Outpatient Medications Prior to Visit  Medication Sig Dispense Refill   albuterol (VENTOLIN HFA) 108 (90 Base) MCG/ACT inhaler Inhale 1-2 puffs into the lungs every 6 (six) hours as needed for wheezing or shortness of breath. 8 g 0   Cholecalciferol (VITAMIN D3) 1.25 MG (50000 UT) CAPS Take 1 capsule by mouth daily.     fluticasone (FLONASE) 50 MCG/ACT nasal spray Place 2 sprays into both nostrils daily. 16 g 6   fluticasone-salmeterol (ADVAIR HFA) 115-21 MCG/ACT inhaler Inhale 2 puffs into the lungs 2 (two) times daily. (Patient not taking: Reported on 10/10/2022) 12 g 12   ipratropium (ATROVENT) 0.03 % nasal spray Place 2 sprays into both nostrils every 12 (twelve) hours. 30 mL 5   ipratropium-albuterol (DUONEB) 0.5-2.5 (3) MG/3ML SOLN Take 3 mLs by nebulization every 6 (six) hours as needed. (Patient not taking: Reported on 10/10/2022) 90 mL 7   levocetirizine (XYZAL) 5 MG tablet Take 5 mg by mouth every evening.     omeprazole (PRILOSEC) 20 MG capsule Take 1 capsule (20 mg total) by mouth daily. 30 capsule 2   ondansetron (ZOFRAN) 4 MG tablet Take 1 tablet by mouth every 8  hours as needed for nausea or vomiting. 20 tablet 3   sertraline (ZOLOFT) 100 MG tablet Take 1 tablet (100 mg total) by mouth daily. 90 tablet 3   No facility-administered medications prior to visit.   Allergies  Allergen Reactions   Polytrim [Polymyxin B-Trimethoprim] Other (See Comments)    OPHTHALMIC - FACIAL/EYE SWELLING     ROS: A complete ROS was performed with pertinent positives/negatives noted in the HPI. The  remainder of the ROS are negative.    Objective:   Today's Vitals   10/13/22 1412  BP: 129/89  Pulse: 79  SpO2: 97%  Weight: 236 lb (107 kg)  Height: 5\' 3"  (1.6 m)    Physical Exam          GENERAL: Well-appearing, in NAD. Well nourished.  SKIN: Pink, warm and dry. No rash, lesion, ulceration, or ecchymoses.  Head: Normocephalic. NECK: Trachea midline. Full ROM w/o pain or tenderness. EARS: Tympanic membranes are intact, translucent without bulging and without drainage. Appropriate landmarks visualized.  EYES: Conjunctiva clear without exudates. EOMI, PERRL, no drainage present.  NOSE: Septum midline w/o deformity. Nares patent, mucosa pink and non-inflamed w/o drainage. No sinus tenderness.  THROAT: Uvula midline. Oropharynx clear.  Mucous membranes pink and moist.  RESPIRATORY: Chest wall symmetrical. Respirations even and non-labored. Breath sounds clear to auscultation bilaterally.  Cough is congested, but not productive CARDIAC: S1,  S2 present, regular rate and rhythm without murmur or gallops. Peripheral pulses 2+ bilaterally.  MSK: Muscle tone and strength appropriate for age.  EXTREMITIES: Without clubbing, cyanosis, or edema.  NEUROLOGIC: No motor or sensory deficits. Steady, even gait. C2-C12 intact.  PSYCH/MENTAL STATUS: Alert, oriented x 3. Cooperative, appropriate mood and affect.   Lab Results  Component Value Date   WBC 9.2 10/03/2022   HGB 12.1 10/03/2022   HCT 34.8 (L) 10/03/2022   MCV 87.9 10/03/2022   PLT 399 10/03/2022      Latest Ref Rng & Units 10/03/2022    5:53 AM 10/02/2022   11:42 AM 08/15/2022    8:37 AM  CMP  Glucose 70 - 99 mg/dL 563  99  96   BUN 6 - 20 mg/dL 14  16  17    Creatinine 0.44 - 1.00 mg/dL 8.75  6.43  3.29   Sodium 135 - 145 mmol/L 139  136  141   Potassium 3.5 - 5.1 mmol/L 3.7  3.5  4.6   Chloride 98 - 111 mmol/L 105  103  104   CO2 22 - 32 mmol/L 25  23  24    Calcium 8.9 - 10.3 mg/dL 9.0  8.9  9.4   Total Protein 6.0 - 8.5  g/dL   6.6   Total Bilirubin 0.0 - 1.2 mg/dL   0.3   Alkaline Phos 44 - 121 IU/L   89   AST 0 - 40 IU/L   20   ALT 0 - 32 IU/L   22     CHEST XRAY 10/01/22:  CLINICAL DATA:  ongoing cough with dyspnea   EXAM: CHEST - 2 VIEW   COMPARISON:  March 2023   FINDINGS: The cardiomediastinal silhouette is within normal limits. No pleural effusion. No pneumothorax. No mass or consolidation. No acute osseous abnormality.   IMPRESSION: No acute findings in the chest.     Electronically Signed   By: Olive Bass M.D.   On: 10/02/2022 13:52  CHEST XRAY 10/02/22:  Narrative & Impression  CLINICAL DATA:  Shortness of breath.   EXAM: PORTABLE CHEST 1 VIEW   COMPARISON:  Radiograph yesterday   FINDINGS: Stable heart size allowing for differences in technique. Unchanged mediastinal contours. No focal airspace disease, large pleural effusion or pneumothorax. Stable osseous structures.   IMPRESSION: No acute chest findings.     Electronically Signed   By: Narda Rutherford M.D.   On: 10/02/2022 16:48      Assessment & Plan:  1. Hospital discharge follow-up 2. Allergic rhinitis, unspecified seasonality, unspecified trigger 3. Mild intermittent asthma with acute exacerbation Discussed possibility of other environmental triggers that may have exacerbated her asthma and allergic rhinitis.  She will continue on her discharge medications and follow-up with pulmonology for PFT testing.  Referral placed to ENT and allergist.  Discussed possibility of eosinophilic esophagitis and to continue the omeprazole as directed.  We will repeat her CBC, BMP and magnesium per discharge recommendation and will notify patient of results.  - CBC with Differential/Platelet - Basic metabolic panel - Magnesium - Ambulatory referral to ENT - Ambulatory referral to Allergy  Lab Orders         CBC with Differential/Platelet         Basic metabolic panel         Magnesium     Return if symptoms worsen  or fail to improve.    Patient to reach out to office if new, worrisome, or unresolved  symptoms arise or if no improvement in patient's condition. Patient verbalized understanding and is agreeable to treatment plan. All questions answered to patient's satisfaction.   A total of 42 minutes were spent on this encounter today. When total time is documented, this includes both the face-to-face and non-face-to-face time personally spent before, during and after the visit on the date of the encounter.    Of note, portions of this note may have been created with voice recognition software [employer], medical). While this note has been edited for accuracy, occasional wrong-word or 'sound-a-like' substitutions may have occurred due to the inherent limitations of voice recognition software.  Yolanda Manges, FNP

## 2022-10-14 LAB — CBC WITH DIFFERENTIAL/PLATELET
Basophils Absolute: 0 10*3/uL (ref 0.0–0.2)
Basos: 0 %
EOS (ABSOLUTE): 0.1 10*3/uL (ref 0.0–0.4)
Eos: 2 %
Hematocrit: 42.3 % (ref 34.0–46.6)
Hemoglobin: 13.7 g/dL (ref 11.1–15.9)
Immature Grans (Abs): 0 10*3/uL (ref 0.0–0.1)
Immature Granulocytes: 0 %
Lymphocytes Absolute: 3 10*3/uL (ref 0.7–3.1)
Lymphs: 34 %
MCH: 29 pg (ref 26.6–33.0)
MCHC: 32.4 g/dL (ref 31.5–35.7)
MCV: 90 fL (ref 79–97)
Monocytes Absolute: 0.6 10*3/uL (ref 0.1–0.9)
Monocytes: 7 %
Neutrophils Absolute: 5.1 10*3/uL (ref 1.4–7.0)
Neutrophils: 57 %
Platelets: 335 10*3/uL (ref 150–450)
RBC: 4.72 x10E6/uL (ref 3.77–5.28)
RDW: 13.6 % (ref 11.7–15.4)
WBC: 8.8 10*3/uL (ref 3.4–10.8)

## 2022-10-14 LAB — BASIC METABOLIC PANEL
BUN/Creatinine Ratio: 16 (ref 9–23)
BUN: 13 mg/dL (ref 6–20)
CO2: 26 mmol/L (ref 20–29)
Calcium: 9.3 mg/dL (ref 8.7–10.2)
Chloride: 102 mmol/L (ref 96–106)
Creatinine, Ser: 0.81 mg/dL (ref 0.57–1.00)
Glucose: 102 mg/dL — ABNORMAL HIGH (ref 70–99)
Potassium: 3.8 mmol/L (ref 3.5–5.2)
Sodium: 141 mmol/L (ref 134–144)
eGFR: 95 mL/min/{1.73_m2} (ref >59)

## 2022-10-14 LAB — MAGNESIUM: Magnesium: 2 mg/dL (ref 1.6–2.3)

## 2022-10-14 NOTE — Progress Notes (Signed)
Patient's labs are within normal limits. Follow up as discussed per visit.

## 2022-11-14 ENCOUNTER — Telehealth: Payer: 59 | Admitting: Physician Assistant

## 2022-11-14 DIAGNOSIS — L731 Pseudofolliculitis barbae: Secondary | ICD-10-CM

## 2022-11-14 MED ORDER — DOXYCYCLINE HYCLATE 100 MG PO TABS
100.0000 mg | ORAL_TABLET | Freq: Two times a day (BID) | ORAL | 0 refills | Status: DC
Start: 2022-11-14 — End: 2022-12-02

## 2022-11-14 MED ORDER — MUPIROCIN 2 % EX OINT: 1.0000 | TOPICAL_OINTMENT | Freq: Two times a day (BID) | CUTANEOUS | 0 refills | Status: DC

## 2022-11-14 NOTE — Progress Notes (Signed)
E Visit for Rash  We are sorry that you are not feeling well. Here is how we plan to help!  Based upon what you have shared with me it looks like you have a bacterial follicultits.  Folliculitis is inflammation of the hair follicles that can be caused by a superficial infection of the skin and is treated with an antibiotic. I have prescribed:, Topical mupiricin, and Doxycycline 100 mg twice per day for 7 days    HOME CARE:  Take cool showers and avoid direct sunlight. Apply cool compress or wet dressings. Take a bath in an oatmeal bath.  Sprinkle content of one Aveeno packet under running faucet with comfortably warm water.  Bathe for 15-20 minutes, 1-2 times daily.  Pat dry with a towel. Do not rub the rash. Use hydrocortisone cream. Take an antihistamine like Benadryl for widespread rashes that itch.  The adult dose of Benadryl is 25-50 mg by mouth 4 times daily. Caution:  This type of medication may cause sleepiness.  Do not drink alcohol, drive, or operate dangerous machinery while taking antihistamines.  Do not take these medications if you have prostate enlargement.  Read package instructions thoroughly on all medications that you take.  GET HELP RIGHT AWAY IF:  Symptoms don't go away after treatment. Severe itching that persists. If you rash spreads or swells. If you rash begins to smell. If it blisters and opens or develops a yellow-brown crust. You develop a fever. You have a sore throat. You become short of breath.  MAKE SURE YOU:  Understand these instructions. Will watch your condition. Will get help right away if you are not doing well or get worse.  Thank you for choosing an e-visit.  Your e-visit answers were reviewed by a board certified advanced clinical practitioner to complete your personal care plan. Depending upon the condition, your plan could have included both over the counter or prescription medications.  Please review your pharmacy choice. Make sure the  pharmacy is open so you can pick up prescription now. If there is a problem, you may contact your provider through [employer] and have the prescription routed to another pharmacy.  Your safety is important to [place]. If you have drug allergies check your prescription carefully.   For the next 24 hours you can use MyChart to ask questions about today's visit, request a non-urgent call back, or ask for a work or school excuse. You will get an email in the next two days asking about your experience. I hope that your e-visit has been valuable and will speed your recovery.   I have spent 5 minutes in review of e-visit questionnaire, review and updating patient chart, medical decision making and response to patient.   Margaretann Loveless, PA-C

## 2022-11-27 ENCOUNTER — Other Ambulatory Visit (HOSPITAL_COMMUNITY): Payer: Self-pay

## 2022-11-27 MED ORDER — INFLUENZA VIRUS VACC SPLIT PF (FLUZONE) 0.5 ML IM SUSY
0.5000 mL | PREFILLED_SYRINGE | Freq: Once | INTRAMUSCULAR | 0 refills | Status: AC
  Filled 2022-11-27: qty 0.5, 1d supply, fill #0

## 2022-12-02 ENCOUNTER — Encounter (INDEPENDENT_AMBULATORY_CARE_PROVIDER_SITE_OTHER): Payer: Self-pay

## 2022-12-02 ENCOUNTER — Ambulatory Visit (INDEPENDENT_AMBULATORY_CARE_PROVIDER_SITE_OTHER): Payer: 59 | Admitting: Otolaryngology

## 2022-12-02 ENCOUNTER — Other Ambulatory Visit: Payer: Self-pay

## 2022-12-02 ENCOUNTER — Other Ambulatory Visit (HOSPITAL_COMMUNITY): Payer: Self-pay

## 2022-12-02 ENCOUNTER — Other Ambulatory Visit (HOSPITAL_BASED_OUTPATIENT_CLINIC_OR_DEPARTMENT_OTHER): Payer: Self-pay

## 2022-12-02 VITALS — Ht 63.0 in | Wt 236.0 lb

## 2022-12-02 DIAGNOSIS — J342 Deviated nasal septum: Secondary | ICD-10-CM | POA: Diagnosis not present

## 2022-12-02 DIAGNOSIS — J328 Other chronic sinusitis: Secondary | ICD-10-CM

## 2022-12-02 DIAGNOSIS — J3089 Other allergic rhinitis: Secondary | ICD-10-CM | POA: Diagnosis not present

## 2022-12-02 DIAGNOSIS — J343 Hypertrophy of nasal turbinates: Secondary | ICD-10-CM

## 2022-12-02 DIAGNOSIS — R0981 Nasal congestion: Secondary | ICD-10-CM

## 2022-12-02 MED ORDER — AMOXICILLIN-POT CLAVULANATE 875-125 MG PO TABS
1.0000 | ORAL_TABLET | Freq: Two times a day (BID) | ORAL | 0 refills | Status: AC
  Filled 2022-12-02: qty 20, 10d supply, fill #0

## 2022-12-02 MED ORDER — PREDNISONE 10 MG PO TABS
ORAL_TABLET | ORAL | 0 refills | Status: AC
  Filled 2022-12-02: qty 15, 10d supply, fill #0

## 2022-12-02 NOTE — Patient Instructions (Addendum)
I have ordered an imaging study for you to complete prior to your next visit. Please call [facility] Scheduling at [phone] to schedule your imaging if you have not received a call within 24 hours. If you are unable to complete your imaging study prior to your next scheduled visit please call our office to let us know.    Lloyd Huger Med Nasal Saline Rinse   - start nasal saline rinses with NeilMed Bottle available over the counter    Nasal Saline Irrigation instructions: If you choose to make your own salt water solution, You will need: Salt (kosher, canning, or pickling salt) Baking soda Nasal irrigation bottle (i.e. Lloyd Huger Med Sinus Rinse) Measuring spoon ( teaspoon) Distilled / boiled water   Mix solution Mix 1 teaspoon of salt, 1/2 teaspoon of baking soda and 1 cup of water into irrigation bottle ** May use saline packet instead of homemade recipe for this step if you prefer If medicine was prescribed to be mixed with solution, place this into bottle Examples 2 inches of 2% mupirocin ointment Budesonide solution Position your head: Lean over sink (about 45 degrees) Rotate head (about 45 degrees) so that one nostril is above the other Irrigate Insert tip of irrigation bottle into upper nostril so it forms a comfortable seal Irrigate while breathing through your mouth May remove the straw from the bottle in order to irrigate the entire solution (important if medicine was added) Exhale through nose when finished and blow nose as necessary  Repeat on opposite side with other 1/2 of solution (120 mL) or remake solution if all 240 mL was used on first side Wash irrigation bottle regularly, replace every 3 months

## 2022-12-02 NOTE — Progress Notes (Addendum)
Dear Dr. Haywood Lasso, Here is my assessment for our mutual patient, Deanna Gross. Thank you for allowing me the opportunity to care for your patient. Please do not hesitate to contact me should you have any other questions. Sincerely, Dr. Jovita Kussmaul  Otolaryngology Clinic Note Referring provider: Dr. Haywood Lasso HPI:  Deanna Gross is a 37 y.o. female kindly referred by Dr. Haywood Lasso for evaluation of allergic rhinitis and chronic sinusitis.  Patient reports that she always has Right > Left sinus pain and pressure (mostly max area) and some headaches. Last 3 years have been the worst since moving. Constant post nasal drip, then triggers cough, then triggers asthma. When infection starts, has a productive cough. Right ear discomfort as  During the infections, has same symptoms but get discolored drainage and symptoms are worse, sometimes teeth sensitivity. Generally hyposmia, but thinks mostly on the right (on left can smell when healthy).She is on doxycycline (day 4) for a sinus infection. No steroids. She typically gets a sinus infection about once a month - usually gets Augmentin. Nasal breathing generally ok.  She has an allergy appointment upcoming.   She has also has seen Pulm, who put her on Advair, Ipratropium, Flonase, Xyzal. She has tried astelin, but did not help. She uses afrin < 3d, and sudafed (and that helps during really bad) She uses Neti pot intermittently She had prednisone a few months ago fpr pulm reasons  Mostly happens in fall (after they moved). Also has a young child - worse because of that as well. Maybe had allergy testing when she was young, but nothing recently.  Antibiotics do help generally; number of courses generally - 4-6/year  On omeprazole as well  H&N Surgery: T&A and Tubes (3 sets) Personal or FHx of bleeding dz or anesthesia difficulty: no  ASA Sens: no  GLP-1: no AP/AC: no  Tobacco: no. Lives in [place].  PMHx: Anxiety, Asthma, Allergic Rhinitis, Chronic  cough  Independent Review of Additional Tests or Records:  Referral, Pulm notes reviewed Dr. Ezzard Standing (2021): notes reviewed Dr. George Hugh notes (2024): he was previously diagnosed with childhood asthma and only on PRN albuterol as an adult. Has never been on maintenance inhalers. Began having annual bronchitis in the last three years in late summer/fall. Has chronic allergies requiring xyzal daily and flonase nightly. Mold and fresh cut grass triggers her allergies. Initially her cough starts nonproductive and dry that becomes productive gets treated with steroids. Feels chest tightness R>L that prevents full breath. Has had wheezing during night and activity. Associated with coughing. Reports reflux associated with stress. Cough improved cold air.  Admitted 09/2022 for asthma exacerbation - given duonebs, solumedrol, mag, and IV steroids and d/c on oral pred and inhalers.  CT Sinus 2021 independent review: Right septal deviation, small retention cyst roof of right max, narrow b/l OMC, bilateral concha bullosa; trace ethmoid disease, no sphenoid or frontal disease PMH/Meds/All/SocHx/FamHx/ROS:   Past Medical History:  Diagnosis Date   Allergic rhinitis 05/03/2015   Anxiety    Asthma    exercise-induced; prn inhaler   Chronic infection of sinus 05/03/2015   Community acquired pneumonia 03/08/2021   Depression    on zoloft, doing well, cut dose.   Fibular avulsion fracture, ATFL sprain right ankle 11/16/2020   Fibular avulsion fracture, ATFL sprain right ankle 11/16/2020   Headache(784.0)    sinus   History of abnormal cervical Pap smear 07/26/2019   History of MRSA infection 2008   left leg   History of pre-eclampsia 07/26/2019  HPV in female    Infection    UTI   Insect bites 08/30/2013   IT band syndrome 05/03/2015   NSVD (normal spontaneous vaginal delivery) 09/17/2018   Obesity (BMI 30-39.9) 05/03/2015   PIH (pregnancy induced hypertension), third trimester 09/16/2018   Preeclampsia, severe,  third trimester 09/16/2018   Recurrent knee pain 05/03/2015   Seasonal allergies    Subacute cough 04/29/2021   Surgical wound, non healing 08/2013   left breast   Tendinitis, de Quervain's 05/08/2017   Uses condoms for contraception 05/23/2015   Vaginal Pap smear, abnormal      Past Surgical History:  Procedure Laterality Date   BREAST CYST EXCISION Left 03/18/2013   Procedure: EXCISE CYST LEFT MEDIAL BREAST;  Surgeon: Velora Heckler, MD;  Location: [facility];  Service: General;  Laterality: Left;   MASS EXCISION Left 09/01/2013   Procedure: RE-EXCISION NON HEALING WOUND LEFT BREAST;  Surgeon: Velora Heckler, MD;  Location:  SURGERY CENTER;  Service: General;  Laterality: Left;   TONSILLECTOMY     TONSILLECTOMY AND ADENOIDECTOMY     TYMPANOSTOMY TUBE PLACEMENT     x 3   WISDOM TOOTH EXTRACTION      Family History  Problem Relation Age of Onset   Hyperlipidemia Father    Hypertension Father    Depression Father    Hypothyroidism Mother    Thyroid disease Mother    Cancer Maternal Grandfather        leukemia   Cancer Paternal Grandfather        non-hodgkins lymphoma   Stroke Maternal Grandmother      Social Connections: Socially Integrated (10/13/2022)   Social Connection and Isolation Panel [NHANES]    Frequency of Communication with Friends and Family: More than three times a week    Frequency of Social Gatherings with Friends and Family: Once a week    Attends Religious Services: 1 to 4 times per year    Active Member of [employer] or Organizations: Yes    Attends Engineer, structural: More than 4 times per year    Marital Status: Married      Current Outpatient Medications:    albuterol (VENTOLIN HFA) 108 (90 Base) MCG/ACT inhaler, Inhale 1-2 puffs into the lungs every 6 (six) hours as needed for wheezing or shortness of breath., Disp: 8 g, Rfl: 0   Cholecalciferol (VITAMIN D3) 1.25 MG (50000 UT) CAPS, Take 1 capsule by mouth daily., Disp: , Rfl:     doxycycline (VIBRA-TABS) 100 MG tablet, Take 1 tablet (100 mg total) by mouth 2 (two) times daily., Disp: 20 tablet, Rfl: 0   fluticasone (FLONASE) 50 MCG/ACT nasal spray, Place 2 sprays into both nostrils daily., Disp: 16 g, Rfl: 6   fluticasone-salmeterol (ADVAIR HFA) 115-21 MCG/ACT inhaler, Inhale 2 puffs into the lungs 2 (two) times daily., Disp: 12 g, Rfl: 12   ipratropium (ATROVENT) 0.03 % nasal spray, Place 2 sprays into both nostrils every 12 (twelve) hours., Disp: 30 mL, Rfl: 5   levocetirizine (XYZAL) 5 MG tablet, Take 5 mg by mouth every evening., Disp: , Rfl:    mupirocin ointment (BACTROBAN) 2 %, Apply 1 Application topically 2 (two) times daily., Disp: 22 g, Rfl: 0   omeprazole (PRILOSEC) 20 MG capsule, Take 1 capsule (20 mg total) by mouth daily., Disp: 30 capsule, Rfl: 2   ondansetron (ZOFRAN) 4 MG tablet, Take 1 tablet by mouth every 8  hours as needed for nausea or vomiting.,  Disp: 20 tablet, Rfl: 3   sertraline (ZOLOFT) 100 MG tablet, Take 1 tablet (100 mg total) by mouth daily., Disp: 90 tablet, Rfl: 3   ipratropium-albuterol (DUONEB) 0.5-2.5 (3) MG/3ML SOLN, Take 3 mLs by nebulization every 6 (six) hours as needed. (Patient not taking: Reported on 10/10/2022), Disp: 90 mL, Rfl: 7   Physical Exam:   Ht 5\' 3"  (1.6 m)   Wt 236 lb (107 kg)   BMI 41.81 kg/m   Salient findings:  CN II-XII intact  Bilateral EAC clear and TM intact with well pneumatized middle ear spaces Anterior rhinoscopy: Septum deviates right; bilateral inferior turbinates with modest hypertrophy; given nature of complaint and exam, further evaluation with nasal endoscopy was necessary and documented below No lesions of oral cavity/oropharynx; dentition intact No obviously palpable neck masses/lymphadenopathy/thyromegaly No respiratory distress or stridor  Seprately Identifiable Procedures:  PROCEDURE: Bilateral Diagnostic Rigid Nasal Endoscopy Pre-procedure diagnosis: Concern for chronic sinusitis,  allergic rhinitis Post-procedure diagnosis: same Indication: See pre-procedure diagnosis and physical exam above Complications: None apparent EBL: 0 mL Anesthesia: Lidocaine 4% and topical decongestant was topically sprayed in each nasal cavity  Description of Procedure:  Patient was identified. A rigid 0 degree endoscope was utilized to evaluate the sinonasal cavities, mucosa, sinus ostia and turbinates and septum.  Overall, signs of mild mucosal inflammation are noted.  No mucopurulence, polyps, or masses noted.   Right Middle meatus: clear, mild mucoid secretions Right SE Recess: clear Left MM: clear Left SE Recess: clear   CPT CODE -- 16109 - Mod 25   Impression & Plans:  Deanna Gross is a 37 y.o. female with h/o asthma now with: Chronic other sinusitis Allergic rhinitis Nasal septal deviation B/l inferior turbinate hypertrophy  Multiple episodes of exacerbation; prior CT with anatomic predisposition to sinusitis including bilateral concha bullosa and narrow OMCs with some max disease. She also has some allergic symptoms, and has had some benefit with sprays. Upcoming pulm PFTs and Allergy eval will be helpful. Given amount of episodes, will treat her medically maximally and obtain post treatment CT  - Stop doxy; start augmentin (prior benefit) - Start prednisone taper (see below) - Continue Flonase, continue Xyzal. - Daily Neilmed sinus rinse - CT Sinus pre-op in 5-6 weeks - will defer allergy eval to upcoming appt but will follow results  See below regarding exact medications prescribed this encounter including dosages and route:  Meds ordered this encounter  Medications   amoxicillin-clavulanate (AUGMENTIN) 875-125 MG tablet    Sig: Take 1 tablet by mouth 2 (two) times daily for 10 days.    Dispense:  20 tablet    Refill:  0   predniSONE (DELTASONE) 10 MG tablet    Sig: Take 2 tablets (20 mg total) by mouth daily with breakfast for 5 days, THEN 1 tablet (10 mg total)  daily with breakfast for 5 days, then stop.    Dispense:  15 tablet    Refill:  0      - f/u 5-6 weeks after CT   Thank you for allowing me the opportunity to care for your patient. Please do not hesitate to contact me should you have any other questions.  Sincerely, Jovita Kussmaul, MD Otolarynoglogist (ENT), [facility] Phone: [phone] Fax: [phone]  12/02/2022, 10:50 AM   MDM:  Level 4 Data complexity: mod - independent review of CT imaging and results - Morbidity: mod  - Prescription Drug prescribed or managed: yes

## 2022-12-11 ENCOUNTER — Ambulatory Visit (INDEPENDENT_AMBULATORY_CARE_PROVIDER_SITE_OTHER): Payer: 59 | Admitting: Family Medicine

## 2022-12-11 ENCOUNTER — Other Ambulatory Visit (HOSPITAL_BASED_OUTPATIENT_CLINIC_OR_DEPARTMENT_OTHER): Payer: Self-pay

## 2022-12-11 VITALS — BP 128/87 | HR 72 | Temp 98.4°F | Ht 63.0 in | Wt 240.4 lb

## 2022-12-11 DIAGNOSIS — N3001 Acute cystitis with hematuria: Secondary | ICD-10-CM

## 2022-12-11 DIAGNOSIS — M549 Dorsalgia, unspecified: Secondary | ICD-10-CM | POA: Diagnosis not present

## 2022-12-11 LAB — POCT URINALYSIS DIP (MANUAL ENTRY)
Bilirubin, UA: NEGATIVE
Glucose, UA: NEGATIVE mg/dL
Ketones, POC UA: NEGATIVE mg/dL
Leukocytes, UA: NEGATIVE
Nitrite, UA: NEGATIVE
Protein Ur, POC: NEGATIVE mg/dL
Spec Grav, UA: 1.02 (ref 1.010–1.025)
Urobilinogen, UA: 0.2 U/dL
pH, UA: 5.5 (ref 5.0–8.0)

## 2022-12-11 MED ORDER — NITROFURANTOIN MONOHYD MACRO 100 MG PO CAPS
100.0000 mg | ORAL_CAPSULE | Freq: Two times a day (BID) | ORAL | 0 refills | Status: DC
Start: 2022-12-11 — End: 2023-02-06
  Filled 2022-12-11: qty 10, 5d supply, fill #0

## 2022-12-11 MED ORDER — PHENAZOPYRIDINE HCL 100 MG PO TABS
200.0000 mg | ORAL_TABLET | Freq: Three times a day (TID) | ORAL | 0 refills | Status: AC | PRN
Start: 2022-12-11 — End: 2022-12-13
  Filled 2022-12-11: qty 10, 2d supply, fill #0

## 2022-12-11 NOTE — Progress Notes (Signed)
Acute Care Office Visit  Subjective:   Deanna Gross March 06, 1985 12/11/2022  Chief Complaint  Patient presents with   Back Pain    Low radiates to front and into vaginal area.    HPI: URINARY SYMPTOMS Onset: 5am this morning.   She reports significant right flank pain radiating into her right lower pelvis, bladder and vaginal area starting 5amm. She took OTC Ibuprofen, Naproxen with mild to moderate relief of right flank pain. She states at [facility] visit she has mild right flank pain that radiates into right suprapubic and vaginal area. Denies vaginal discharge, bleeding.   Fever/chills: no Dysuria: no Urinary frequency: yes Urgency: yes Foul odor: no Urinary incontinence: no Hematuria: no Abdominal pain: yes Suprapubic pain/pressure: yes Flank/low back pain:  Yes, relieved to right flank currently Nausea/Vomiting: no  Treatments tried: Naproxen, Ibuprofen 800mg  at 6am   Previous urinary tract infection: no Recurrent urinary tract infection: no History of sexually transmitted disease: no    The following portions of the patient's history were reviewed and updated as appropriate: past medical history, past surgical history, family history, social history, allergies, medications, and problem list.   Patient Active Problem List   Diagnosis Date Noted   Asthma exacerbation 10/02/2022   Syncope, vasovagal 10/02/2022   Subacute cough 10/01/2022   Encounter for annual physical exam 09/25/2021   Anxiety 01/01/2021   Eczema 01/01/2021   BMI 40.0-44.9, adult (HCC) 01/01/2021   Mixed hyperlipidemia 01/01/2021   Generalized anxiety disorder 07/26/2019   HPV in female 05/22/2015   Allergic rhinitis 05/03/2015   Astigmatism 01/13/2012   Past Medical History:  Diagnosis Date   Allergic rhinitis 05/03/2015   Anxiety    Asthma    exercise-induced; prn inhaler   Chronic infection of sinus 05/03/2015   Community acquired pneumonia 03/08/2021   Depression    on  zoloft, doing well, cut dose.   Fibular avulsion fracture, ATFL sprain right ankle 11/16/2020   Fibular avulsion fracture, ATFL sprain right ankle 11/16/2020   Headache(784.0)    sinus   History of abnormal cervical Pap smear 07/26/2019   History of MRSA infection 2008   left leg   History of pre-eclampsia 07/26/2019   HPV in female    Infection    UTI   Insect bites 08/30/2013   IT band syndrome 05/03/2015   NSVD (normal spontaneous vaginal delivery) 09/17/2018   Obesity (BMI 30-39.9) 05/03/2015   PIH (pregnancy induced hypertension), third trimester 09/16/2018   Preeclampsia, severe, third trimester 09/16/2018   Recurrent knee pain 05/03/2015   Seasonal allergies    Subacute cough 04/29/2021   Surgical wound, non healing 08/2013   left breast   Tendinitis, de Quervain's 05/08/2017   Uses condoms for contraception 05/23/2015   Vaginal Pap smear, abnormal    Past Surgical History:  Procedure Laterality Date   BREAST CYST EXCISION Left 03/18/2013   Procedure: EXCISE CYST LEFT MEDIAL BREAST;  Surgeon: Velora Heckler, MD;  Location: [facility];  Service: General;  Laterality: Left;   MASS EXCISION Left 09/01/2013   Procedure: RE-EXCISION NON HEALING WOUND LEFT BREAST;  Surgeon: Velora Heckler, MD;  Location: [facility];  Service: General;  Laterality: Left;   TONSILLECTOMY     TONSILLECTOMY AND ADENOIDECTOMY     TYMPANOSTOMY TUBE PLACEMENT     x 3   WISDOM TOOTH EXTRACTION     Family History  Problem Relation Age of Onset   Hyperlipidemia Father  Hypertension Father    Depression Father    Hypothyroidism Mother    Thyroid disease Mother    Cancer Maternal Grandfather        leukemia   Cancer Paternal Grandfather        non-hodgkins lymphoma   Stroke Maternal Grandmother    Outpatient Medications Prior to Visit  Medication Sig Dispense Refill   albuterol (VENTOLIN HFA) 108 (90 Base) MCG/ACT inhaler Inhale 1-2 puffs into the lungs every 6 (six) hours as  needed for wheezing or shortness of breath. 8 g 0   amoxicillin-clavulanate (AUGMENTIN) 875-125 MG tablet Take 1 tablet by mouth 2 (two) times daily for 10 days. 20 tablet 0   Cholecalciferol (VITAMIN D3) 1.25 MG (50000 UT) CAPS Take 1 capsule by mouth daily.     fluticasone (FLONASE) 50 MCG/ACT nasal spray Place 2 sprays into both nostrils daily. 16 g 6   fluticasone-salmeterol (ADVAIR HFA) 115-21 MCG/ACT inhaler Inhale 2 puffs into the lungs 2 (two) times daily. 12 g 12   ipratropium (ATROVENT) 0.03 % nasal spray Place 2 sprays into both nostrils every 12 (twelve) hours. 30 mL 5   ipratropium-albuterol (DUONEB) 0.5-2.5 (3) MG/3ML SOLN Take 3 mLs by nebulization every 6 (six) hours as needed. 90 mL 7   levocetirizine (XYZAL) 5 MG tablet Take 5 mg by mouth every evening.     mupirocin ointment (BACTROBAN) 2 % Apply 1 Application topically 2 (two) times daily. 22 g 0   omeprazole (PRILOSEC) 20 MG capsule Take 1 capsule (20 mg total) by mouth daily. 30 capsule 2   ondansetron (ZOFRAN) 4 MG tablet Take 1 tablet by mouth every 8  hours as needed for nausea or vomiting. 20 tablet 3   predniSONE (DELTASONE) 10 MG tablet Take 2 tablets (20 mg total) by mouth daily with breakfast for 5 days, THEN 1 tablet (10 mg total) daily with breakfast for 5 days, then stop. 15 tablet 0   sertraline (ZOLOFT) 100 MG tablet Take 1 tablet (100 mg total) by mouth daily. 90 tablet 3   No facility-administered medications prior to visit.   Allergies  Allergen Reactions   Polytrim [Polymyxin B-Trimethoprim] Other (See Comments)    OPHTHALMIC - FACIAL/EYE SWELLING     ROS: A complete ROS was performed with pertinent positives/negatives noted in the HPI. The remainder of the ROS are negative.    Objective:   Today's Vitals   12/11/22 1421  BP: 128/87  Pulse: 72  Temp: 98.4 F (36.9 C)  TempSrc: Oral  SpO2: 98%  Weight: 240 lb 6.4 oz (109 kg)  Height: 5\' 3"  (1.6 m)  PainSc: 3   PainLoc: Back    GENERAL:  Well-appearing, in NAD. Well nourished.  SKIN: Pink, warm and dry. No rash, lesion, ulceration, or ecchymoses.  Head: Normocephalic. NECK: Trachea midline. Full ROM w/o pain or tenderness. RESPIRATORY: Chest wall symmetrical. Respirations even and non-labored.  GI: Abdomen soft, non-tender. Normoactive bowel sounds. No rebound tenderness. No hepatomegaly or splenomegaly. No CVA tenderness.  MSK: Muscle tone and strength appropriate for age.  EXTREMITIES: Without clubbing, cyanosis, or edema.  NEUROLOGIC: No motor or sensory deficits. Steady, even gait. C2-C12 intact.  PSYCH/MENTAL STATUS: Alert, oriented x 3. Cooperative, appropriate mood and affect.    Results for orders placed or performed in visit on 12/11/22  POCT urinalysis dipstick  Result Value Ref Range   Color, UA yellow yellow   Clarity, UA cloudy (A) clear   Glucose, UA negative negative mg/dL  Bilirubin, UA negative negative   Ketones, POC UA negative negative mg/dL   Spec Grav, UA 1.610 9.604 - 1.025   Blood, UA moderate (A) negative   pH, UA 5.5 5.0 - 8.0   Protein Ur, POC negative negative mg/dL   Urobilinogen, UA 0.2 0.2 or 1.0 E.U./dL   Nitrite, UA Negative Negative   Leukocytes, UA Negative Negative      Assessment & Plan:  1. Back pain, unspecified back location, unspecified back pain laterality, unspecified chronicity 2. Acute cystitis with hematuria Likely cystitis given patient's presentation. Negative for CVA tenderness, no hx of renal stones. Will culture urine and start Macrobid 100mg  BID and use pyridium as needed for bladder spasms fo rup to 2 days. Recommend pushing clear fluids. Reach out to PCP if flank pain worsens, fever/chills, nausea/vomiting or worsening urinary symptoms and we will obtain CT stone study at that time. Instructed to also go to ER for red flag symptoms. Pt verbalized understanding. Will notify of urine culture results when available.  - Urine Culture   Meds ordered this encounter   Medications   nitrofurantoin, macrocrystal-monohydrate, (MACROBID) 100 MG capsule    Sig: Take 1 capsule (100 mg total) by mouth 2 (two) times daily.    Dispense:  10 capsule    Refill:  0    Order Specific Question:   Supervising Provider    Answer:   DE Peru, RAYMOND J [[id number]]   phenazopyridine (PYRIDIUM) 100 MG tablet    Sig: Take 2 tablets (200 mg total) by mouth 3 (three) times daily as needed for up to 2 days for pain (bladder spasm).    Dispense:  10 tablet    Refill:  0    Order Specific Question:   Supervising Provider    Answer:   DE Peru, RAYMOND J [[id number]]   Lab Orders         Urine Culture         POCT urinalysis dipstick    No images are attached to the encounter or orders placed in the encounter.  Return if symptoms worsen or fail to improve.    Patient to reach out to office if new, worrisome, or unresolved symptoms arise or if no improvement in patient's condition. Patient verbalized understanding and is agreeable to treatment plan. All questions answered to patient's satisfaction.    Yolanda Manges, FNP

## 2022-12-12 ENCOUNTER — Other Ambulatory Visit: Payer: Self-pay

## 2022-12-12 ENCOUNTER — Encounter: Payer: Self-pay | Admitting: Internal Medicine

## 2022-12-12 ENCOUNTER — Ambulatory Visit (INDEPENDENT_AMBULATORY_CARE_PROVIDER_SITE_OTHER): Payer: 59 | Admitting: Internal Medicine

## 2022-12-12 VITALS — BP 112/72 | HR 72 | Temp 98.3°F | Resp 20 | Ht 63.25 in | Wt 243.6 lb

## 2022-12-12 DIAGNOSIS — J310 Chronic rhinitis: Secondary | ICD-10-CM | POA: Diagnosis not present

## 2022-12-12 DIAGNOSIS — H109 Unspecified conjunctivitis: Secondary | ICD-10-CM | POA: Insufficient documentation

## 2022-12-12 DIAGNOSIS — J343 Hypertrophy of nasal turbinates: Secondary | ICD-10-CM | POA: Diagnosis not present

## 2022-12-12 DIAGNOSIS — L301 Dyshidrosis [pompholyx]: Secondary | ICD-10-CM | POA: Diagnosis not present

## 2022-12-12 DIAGNOSIS — J328 Other chronic sinusitis: Secondary | ICD-10-CM

## 2022-12-12 DIAGNOSIS — J4540 Moderate persistent asthma, uncomplicated: Secondary | ICD-10-CM | POA: Diagnosis not present

## 2022-12-12 DIAGNOSIS — J342 Deviated nasal septum: Secondary | ICD-10-CM

## 2022-12-12 DIAGNOSIS — H1045 Other chronic allergic conjunctivitis: Secondary | ICD-10-CM

## 2022-12-12 LAB — URINE CULTURE

## 2022-12-12 MED ORDER — TRIAMCINOLONE ACETONIDE 0.5 % EX OINT: 1.0000 | TOPICAL_OINTMENT | Freq: Two times a day (BID) | CUTANEOUS | 1 refills | Status: DC | PRN

## 2022-12-12 NOTE — Patient Instructions (Addendum)
Asthma: Moderate persistent History of recent hospitalization for asthma, currently controlled on Advair, since hospitalization.  No recent need for albuterol. Asthma symptoms likely exacerbated by rhinosinusitis. Breathing test today looked great! -Continue Advair 115 mcg 2 puffs twice daily with spacer  -Rinse mouth after use -Continue Albuterol (Proair/Ventolin) 2 puffs . Use  every 4-6 hours as needed for chest tightness, wheezing, or coughing.  Can also use 15 minutes prior to exercise if you have symptoms with activity. - Asthma is not controlled if:  - Symptoms are occurring >2 times a week OR  - >2 times a month nighttime awakenings  - You are requiring systemic steroids (prednisone/steroid injections) more than once per year  - Your require hospitalization for your asthma.  - Please call the clinic to schedule a follow up if these symptoms arise   Allergic Rhinitis: Not well-controlled Chronic sinus headaches, postnasal drip, and allergic symptoms worsen in the fall. Currently on Xyzal and Flonase. -Continue Xyzal 5 mg daily  -Continue Flonase 1 spray per nostril twice daily.  Aim upward and outward -We will get blood work to start and have her follow-up for allergy skin test after she is 2 weeks off of prednisone -Consider allergy injections for long-term control of rhinosinusitis, asthma and potentially reduce lifetime use of medications  -Information allergy injections given today  Chronic Sinusitis History of sinus issues, including a 5mm polyp in the right sinus. Currently on prednisone and antibiotics. -Continue current treatment: Augmentin and prednisone -Continue to follow with ENT with plan of treated CT and possible surgical intervention -May consider addition of allergy injections pending testing and result of workup outlined above  Dyshidrotic Eczema History of hand eczema, currently managed with Aquaphor stick. -Prescribe triamcinolone 0.1% ointment twice daily for  flares. -Advise on hand care:avoid overwashing, use paper towel to dry hands, apply lotion after washing, use Vaseline and gloves at night if condition worsens.  General Health Maintenance -Draw labs to assess for allergic sensitivities. -Schedule follow-up for allergy testing, holding Xyzal for three days prior.   Thank you so much for letting me partake in your care today.  Don't hesitate to reach out if you have any additional concerns!  Ferol Luz, MD  [facility] [place] [place]

## 2022-12-12 NOTE — Progress Notes (Signed)
NEW PATIENT Date of Service/Encounter:  12/12/22 Referring provider: Hilbert Gross, * Primary care provider: Hilbert Bible, FNP  Subjective:  Deanna Gross is a 37 y.o. female  presenting today for evaluation of chronic sinusitis, asthma,, allergic rhinitis History obtained from: chart review and patient.   Discussed the use of AI scribe software for clinical note transcription with the patient, who gave verbal consent to proceed.  History of Present Illness   Deanna Gross, a patient with a long-standing history of asthma, has experienced a significant worsening of her condition over the past three years. This deterioration has been marked by recurrent bronchitis and culminated in a hospitalization in September. Post-hospitalization, she was started on Advair and ipitropium inhaler, Xyzal, and Flonase by a pulmonologist. She also has a history of chronic sinusitis, which has been present for most of her life and has been associated with chronic sinus headaches. Despite being heavily medicated, she continues to experience these headaches.  Deanna Gross's asthma, diagnosed at age [age] or [age], has resulted in multiple hospitalizations throughout her life, with the most recent one occurring in the past year. She has been on prednisone for her asthma twice in the past 12 months. Prior to her recent hospitalization, she was only on albuterol, with controller therapy being added only when she fell ill. However, since starting on Advair, she has not needed her albuterol inhaler.  Deanna Gross's sinusitis has been particularly severe over the past three years, with flare-ups occurring consistently in the August-September timeframe. She suspects an allergic component to her condition, possibly triggered [place]. She has been on antibiotics and steroids for her sinusitis, and surgery is being considered as a future treatment option.  In addition to her  respiratory issues, Deanna Gross also suffers from eczema, which primarily affects her right hand, specifically the fourth finger. This condition presents with blisters and intense itching. She has found some relief by applying Aquaphor stick nightly.  Deanna Gross's asthma and sinusitis symptoms are triggered by various factors, including postnasal drip, exercise, and strong odors. She also has a history of allergic reactions to mold and fresh cut grass. Despite her ongoing health issues, Deanna Gross remains committed to finding a solution and is open to the possibility of allergy injections as a potential treatment option.       ACT 21   Chart Review:  ENT Dr. Allena Katz 12/01/22: Nasal endoscopy with inferior turbinate hypertrophy, septal deviation and mucosal inflammation.  Transition to Augmentin, prednisone taper.  Anticipate possible surgical intervention but waiting on posttreatment CT. T&A, 3 sets of PET  Pulm Dr. Everardo All 2024 : Recurrent bronchitis over the past 3 years in late summer/fall.  Increasing allergic rhinitis.  Recurrent nonproductive to progressive progressive cough that is often treated with steroids.  Admission 9/24 for asthma exacerbation given DuoNebs, Solu-Medrol, mag, IV steroids and DC on oral Pred and inhalers.  Started on Advair, ipratropium, Flonase, Xyzal after this appointment  CT Sinus 2021 independent review: Right septal deviation, small retention cyst roof of right max, narrow b/l OMC, bilateral concha bullosa; trace ethmoid disease, no sphenoid or frontal disease   Other allergy screening: Asthma:  Yes  Rhino conjunctivitis: yes Food allergy: no Medication allergy: yes Hymenoptera allergy: no Urticaria: no Eczema: In between her fingers  History of recurrent infections suggestive of immunodeficency:  3-4 sinus infections a year pending surgical intervention Vaccinations are up to date.   Past Medical History: Past Medical History:  Diagnosis Date   Allergic rhinitis  05/03/2015   Anxiety    Asthma    exercise-induced; prn inhaler   Chronic infection of sinus 05/03/2015   Community acquired pneumonia 03/08/2021   Depression    on zoloft, doing well, cut dose.   Eczema    Fibular avulsion fracture, ATFL sprain right ankle 11/16/2020   Fibular avulsion fracture, ATFL sprain right ankle 11/16/2020   Headache(784.0)    sinus   History of abnormal cervical Pap smear 07/26/2019   History of MRSA infection 2008   left leg   History of pre-eclampsia 07/26/2019   HPV in female    Infection    UTI   Insect bites 08/30/2013   IT band syndrome 05/03/2015   NSVD (normal spontaneous vaginal delivery) 09/17/2018   Obesity (BMI 30-39.9) 05/03/2015   PIH (pregnancy induced hypertension), third trimester 09/16/2018   Preeclampsia, severe, third trimester 09/16/2018   Recurrent knee pain 05/03/2015   Recurrent upper respiratory infection (URI)    Seasonal allergies    Subacute cough 04/29/2021   Surgical wound, non healing 08/2013   left breast   Tendinitis, de Quervain's 05/08/2017   Uses condoms for contraception 05/23/2015   Vaginal Pap smear, abnormal    Medication List:  Current Outpatient Medications  Medication Sig Dispense Refill   albuterol (VENTOLIN HFA) 108 (90 Base) MCG/ACT inhaler Inhale 1-2 puffs into the lungs every 6 (six) hours as needed for wheezing or shortness of breath. 8 g 0   amoxicillin-clavulanate (AUGMENTIN) 875-125 MG tablet Take 1 tablet by mouth 2 (two) times daily for 10 days. 20 tablet 0   Cholecalciferol (VITAMIN D3) 1.25 MG (50000 UT) CAPS Take 1 capsule by mouth daily.     fluticasone (FLONASE) 50 MCG/ACT nasal spray Place 2 sprays into both nostrils daily. 16 g 6   fluticasone-salmeterol (ADVAIR HFA) 115-21 MCG/ACT inhaler Inhale 2 puffs into the lungs 2 (two) times daily. 12 g 12   ipratropium (ATROVENT) 0.03 % nasal spray Place 2 sprays into both nostrils every 12 (twelve) hours. 30 mL 5   ipratropium-albuterol  (DUONEB) 0.5-2.5 (3) MG/3ML SOLN Take 3 mLs by nebulization every 6 (six) hours as needed. 90 mL 7   levocetirizine (XYZAL) 5 MG tablet Take 5 mg by mouth every evening.     mupirocin ointment (BACTROBAN) 2 % Apply 1 Application topically 2 (two) times daily. 22 g 0   nitrofurantoin, macrocrystal-monohydrate, (MACROBID) 100 MG capsule Take 1 capsule (100 mg total) by mouth 2 (two) times daily. 10 capsule 0   omeprazole (PRILOSEC) 20 MG capsule Take 1 capsule (20 mg total) by mouth daily. 30 capsule 2   ondansetron (ZOFRAN) 4 MG tablet Take 1 tablet by mouth every 8  hours as needed for nausea or vomiting. 20 tablet 3   phenazopyridine (PYRIDIUM) 100 MG tablet Take 2 tablets (200 mg total) by mouth 3 (three) times daily as needed for up to 2 days for pain (bladder spasm). 10 tablet 0   predniSONE (DELTASONE) 10 MG tablet Take 2 tablets (20 mg total) by mouth daily with breakfast for 5 days, THEN 1 tablet (10 mg total) daily with breakfast for 5 days, then stop. 15 tablet 0   sertraline (ZOLOFT) 100 MG tablet Take 1 tablet (100 mg total) by mouth daily. 90 tablet 3   No current facility-administered medications for this visit.   Known Allergies:  Allergies  Allergen Reactions   Polytrim [Polymyxin B-Trimethoprim] Other (See Comments)    OPHTHALMIC -  FACIAL/EYE SWELLING   Past Surgical History: Past Surgical History:  Procedure Laterality Date   ADENOIDECTOMY     BREAST CYST EXCISION Left 03/18/2013   Procedure: EXCISE CYST LEFT MEDIAL BREAST;  Surgeon: Velora Heckler, MD;  Location: [facility];  Service: General;  Laterality: Left;   MASS EXCISION Left 09/01/2013   Procedure: RE-EXCISION NON HEALING WOUND LEFT BREAST;  Surgeon: Velora Heckler, MD;  Location: [facility];  Service: General;  Laterality: Left;   TONSILLECTOMY     TONSILLECTOMY AND ADENOIDECTOMY     TYMPANOSTOMY TUBE PLACEMENT     x 3   WISDOM TOOTH EXTRACTION     Family History: Family History   Problem Relation Age of Onset   Hypothyroidism Mother    Thyroid disease Mother    Eczema Father    Allergic rhinitis Father    Hyperlipidemia Father    Hypertension Father    Depression Father    Asthma Sister    Eczema Sister    Allergic rhinitis Sister    Eczema Brother    Asthma Brother    Allergic rhinitis Brother    Stroke Maternal Grandmother    Cancer Maternal Grandfather        leukemia   Cancer Paternal Grandfather        non-hodgkins lymphoma   Social History: Ronnah lives single-family home is [address] years old.  No water damage to the house and hardwood throughout.  Electric heating, central cooling, 1 dog of goldendoodle and a rabbit inside the house, goldendoodle has access to bedroom.  No roaches in the house and bed is 2 feet off floor.  Dust mite precautions on bed but not pillows.  No tobacco exposure.  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  Does get exposed to fumes, chemicals or dust at her job.  No HEPA filter in the home and home is not near an interstate industrial area.   ROS:  All other systems negative except as noted per HPI.  Objective:  Blood pressure 112/72, pulse 72, temperature 98.3 F (36.8 C), temperature source Temporal, resp. rate 20, height 5' 3.25" (1.607 m), weight 243 lb 9.6 oz (110.5 kg), last menstrual period 11/27/2022, SpO2 95%, unknown if currently breastfeeding. Body mass index is 42.81 kg/m. Physical Exam:  General Appearance:  Alert, cooperative, no distress, appears stated age  Head:  Normocephalic, without obvious abnormality, atraumatic  Eyes:  Conjunctiva clear, EOM's intact  Ears EACs normal bilaterally  Nose: Nares normal,  erythematous and edematous nasal mucosa with scant clear to yellow rhinorrhea, hypertrophic turbinates, and no visible anterior polyps, deviated septum  Throat: Lips, tongue normal; teeth and gums normal, + cobblestoning  Neck: Supple, symmetrical  Lungs:   clear to auscultation bilaterally,  Respirations unlabored, no coughing  Heart:  regular rate and rhythm and no murmur, Appears well perfused  Extremities: No edema  Skin: Skin color, texture, turgor normal and no rashes or lesions on visualized portions of skin  Neurologic: No gross deficits   Diagnostics: Spirometry:  Tracings reviewed. Her effort: Good reproducible efforts. FVC: 3.19 L (pre), 89 L  (post) FEV1: 2.87 L, 97% predicted (pre),  FEV1/FVC ratio: 90 (pre),  Interpretation: Spirometry consistent with normal pattern.  Please see scanned spirometry results for details.   Labs:  Lab Orders         Allergens, Zone 2         IgE       Assessment  and Plan  Assessment and Plan    Asthma: Moderate persistent History of recent hospitalization for asthma, currently controlled on Advair, since hospitalization.  No recent need for albuterol. Asthma symptoms likely exacerbated by rhinosinusitis. Breathing test today looked great! -Continue Advair 115 mcg 2 puffs twice daily with spacer  -Rinse mouth after use -Continue Albuterol (Proair/Ventolin) 2 puffs . Use  every 4-6 hours as needed for chest tightness, wheezing, or coughing.  Can also use 15 minutes prior to exercise if you have symptoms with activity. - Asthma is not controlled if:  - Symptoms are occurring >2 times a week OR  - >2 times a month nighttime awakenings  - You are requiring systemic steroids (prednisone/steroid injections) more than once per year  - Your require hospitalization for your asthma.  - Please call the clinic to schedule a follow up if these symptoms arise   Allergic Rhinitis: Not well-controlled Chronic sinus headaches, postnasal drip, and allergic symptoms worsen in the fall. Currently on Xyzal and Flonase. -Continue Xyzal 5 mg daily  -Continue Flonase 1 spray per nostril twice daily.  Aim upward and outward -We will get blood work to start and have her follow-up for allergy skin test after she is 2 weeks off of  prednisone -Consider allergy injections for long-term control of rhinosinusitis, asthma and potentially reduce lifetime use of medications  -Information allergy injections given today  Chronic Sinusitis History of sinus issues, including a 5mm polyp in the right sinus. Currently on prednisone and antibiotics. -Continue current treatment: Augmentin and prednisone -Continue to follow with ENT with plan of treated CT and possible surgical intervention -May consider addition of allergy injections pending testing and result of workup outlined above  Dyshidrotic Eczema History of hand eczema, currently managed with Aquaphor stick. -Prescribe triamcinolone 0.1% ointment twice daily for flares. -Advise on hand care:avoid overwashing, use paper towel to dry hands, apply lotion after washing, use Vaseline and gloves at night if condition worsens.  General Health Maintenance -Draw labs to assess for allergic sensitivities. -Schedule follow-up for allergy testing, holding Xyzal for three days prior.      This note in its entirety was forwarded to the Provider who requested this consultation.  Other:  none   Thank you for your kind referral. I appreciate the opportunity to take part in Treacy's care. Please do not hesitate to contact me with questions.  Sincerely,  Thank you so much for letting me partake in your care today.  Don't hesitate to reach out if you have any additional concerns!  Ferol Luz, MD  [facility] [place], [place]

## 2022-12-15 ENCOUNTER — Encounter (HOSPITAL_BASED_OUTPATIENT_CLINIC_OR_DEPARTMENT_OTHER): Payer: Self-pay | Admitting: Family Medicine

## 2022-12-15 ENCOUNTER — Other Ambulatory Visit (HOSPITAL_COMMUNITY): Payer: Self-pay

## 2022-12-15 LAB — ALLERGENS, ZONE 2

## 2022-12-15 LAB — IGE: IgE (Immunoglobulin E), Serum: 20 [IU]/mL (ref 6–495)

## 2022-12-15 NOTE — Progress Notes (Signed)
Hi Deanna Gross,  Your urine culture does not show a large urinary infection. How are you feeling since starting the antibiotic?

## 2022-12-15 NOTE — Telephone Encounter (Signed)
Please see mychart as an FYI.

## 2022-12-18 ENCOUNTER — Encounter (HOSPITAL_BASED_OUTPATIENT_CLINIC_OR_DEPARTMENT_OTHER): Payer: Self-pay | Admitting: Family Medicine

## 2023-01-05 NOTE — Progress Notes (Signed)
Blood work was negative for all environmental allergies.  She should follow-up for allergy skin testing to confirm.

## 2023-01-15 ENCOUNTER — Ambulatory Visit (HOSPITAL_BASED_OUTPATIENT_CLINIC_OR_DEPARTMENT_OTHER): Payer: 59 | Admitting: Pulmonary Disease

## 2023-01-16 ENCOUNTER — Ambulatory Visit (INDEPENDENT_AMBULATORY_CARE_PROVIDER_SITE_OTHER): Payer: 59

## 2023-01-27 ENCOUNTER — Telehealth: Payer: 59 | Admitting: Physician Assistant

## 2023-01-27 ENCOUNTER — Other Ambulatory Visit (HOSPITAL_COMMUNITY): Payer: Self-pay

## 2023-01-27 ENCOUNTER — Ambulatory Visit (HOSPITAL_COMMUNITY): Payer: 59

## 2023-01-27 ENCOUNTER — Ambulatory Visit (HOSPITAL_BASED_OUTPATIENT_CLINIC_OR_DEPARTMENT_OTHER): Payer: 59 | Admitting: Pulmonary Disease

## 2023-01-27 DIAGNOSIS — J019 Acute sinusitis, unspecified: Secondary | ICD-10-CM

## 2023-01-27 DIAGNOSIS — B9689 Other specified bacterial agents as the cause of diseases classified elsewhere: Secondary | ICD-10-CM | POA: Diagnosis not present

## 2023-01-27 DIAGNOSIS — J4520 Mild intermittent asthma, uncomplicated: Secondary | ICD-10-CM | POA: Diagnosis not present

## 2023-01-27 LAB — PULMONARY FUNCTION TEST
DL/VA % pred: 134 %
DL/VA: 6.03 ml/min/mmHg/L
DLCO cor % pred: 118 %
DLCO cor: 25.58 ml/min/mmHg
DLCO unc % pred: 118 %
DLCO unc: 25.58 ml/min/mmHg
FEF 25-75 Post: 3.6 L/s
FEF 25-75 Pre: 3.18 L/s
FEF2575-%Change-Post: 13 %
FEF2575-%Pred-Post: 113 %
FEF2575-%Pred-Pre: 100 %
FEV1-%Change-Post: 4 %
FEV1-%Pred-Post: 93 %
FEV1-%Pred-Pre: 89 %
FEV1-Post: 2.81 L
FEV1-Pre: 2.69 L
FEV1FVC-%Change-Post: 1 %
FEV1FVC-%Pred-Pre: 104 %
FEV6-%Change-Post: 3 %
FEV6-%Pred-Post: 89 %
FEV6-%Pred-Pre: 86 %
FEV6-Post: 3.22 L
FEV6-Pre: 3.11 L
FEV6FVC-%Pred-Post: 101 %
FEV6FVC-%Pred-Pre: 101 %
FVC-%Change-Post: 3 %
FVC-%Pred-Post: 88 %
FVC-%Pred-Pre: 85 %
FVC-Post: 3.22 L
FVC-Pre: 3.12 L
Post FEV1/FVC ratio: 87 %
Post FEV6/FVC ratio: 100 %
Pre FEV1/FVC ratio: 86 %
Pre FEV6/FVC Ratio: 100 %
RV % pred: 119 %
RV: 1.82 L
TLC % pred: 104 %
TLC: 5.22 L

## 2023-01-27 MED ORDER — AMOXICILLIN-POT CLAVULANATE 875-125 MG PO TABS
1.0000 | ORAL_TABLET | Freq: Two times a day (BID) | ORAL | 0 refills | Status: DC
Start: 2023-01-27 — End: 2023-08-25
  Filled 2023-01-27: qty 14, 7d supply, fill #0

## 2023-01-27 NOTE — Patient Instructions (Signed)
 Full PFT Performed Today

## 2023-01-27 NOTE — Progress Notes (Signed)
Full PFT Performed Today  

## 2023-01-27 NOTE — Progress Notes (Signed)

## 2023-01-30 ENCOUNTER — Ambulatory Visit: Payer: 59 | Admitting: Internal Medicine

## 2023-02-06 ENCOUNTER — Encounter (HOSPITAL_BASED_OUTPATIENT_CLINIC_OR_DEPARTMENT_OTHER): Payer: Self-pay | Admitting: Pulmonary Disease

## 2023-02-06 ENCOUNTER — Telehealth (INDEPENDENT_AMBULATORY_CARE_PROVIDER_SITE_OTHER): Payer: Commercial Managed Care - PPO | Admitting: Pulmonary Disease

## 2023-02-06 ENCOUNTER — Other Ambulatory Visit (HOSPITAL_COMMUNITY): Payer: Self-pay

## 2023-02-06 ENCOUNTER — Ambulatory Visit (HOSPITAL_COMMUNITY): Payer: Commercial Managed Care - PPO

## 2023-02-06 DIAGNOSIS — J4520 Mild intermittent asthma, uncomplicated: Secondary | ICD-10-CM | POA: Diagnosis not present

## 2023-02-06 MED ORDER — FLUTICASONE-SALMETEROL 100-50 MCG/ACT IN AEPB
1.0000 | INHALATION_SPRAY | Freq: Two times a day (BID) | RESPIRATORY_TRACT | 11 refills | Status: AC
  Filled 2023-02-06 – 2024-01-20 (×2): qty 60, 30d supply, fill #0

## 2023-02-06 NOTE — Patient Instructions (Signed)
 Chronic cough Mild intermittent asthma Allergic rhinitis --Change to generic Advair  Diskus 100 puff in the morning and evening. Rinse mouth out after use --Reviewed pulmonary function tests. Overall normal.  --Allergies: Continue xyzal. START atrovent  in the morning and afternoon as needed. Continue flonase  nightly --Reflux: CONTINUE omeprazole  20 mg as needed

## 2023-02-06 NOTE — Progress Notes (Signed)
 Virtual Visit via Video Note  I connected with Deanna Gross on 02/06/23 at  1:00 PM EST by a video enabled telemedicine application and verified that I am speaking with the correct person using two identifiers.  Location: Patient: Home Provider: [facility]   I discussed the limitations of evaluation and management by telemedicine and the availability of in person appointments. The patient expressed understanding and agreed to proceed.   I discussed the assessment and treatment plan with the patient. The patient was provided an opportunity to ask questions and all were answered. The patient agreed with the plan and demonstrated an understanding of the instructions.   The patient was advised to call back or seek an in-person evaluation if the symptoms worsen or if the condition fails to improve as anticipated.   I provided 30 minutes of non-face-to-face time during this encounter.   Makylie Rivere Slater Staff, MD   Subjective:   PATIENT ID: Deanna Gross GENDER: female DOB: [birth year], MRN: [id number]  Chief Complaint  Patient presents with   Follow-up    Breathing is good and the new med regimen is amazing.     Reason for Visit: Follow-up cough  Ms. Deanna Gross is a 38 year old female never smoker with childhood asthma, allergic rhinitis, anxiety, HLD who presents for follow-up for cough and wheezing.  Initial consult She was previously diagnosed with childhood asthma and only on PRN albuterol  as an adult. Has never been on maintenance inhalers. Began having annual bronchitis in the last three years in late summer/fall. Has chronic allergies requiring xyzal daily and flonase  nightly. Mold and fresh cut grass triggers her allergies. Initially her cough starts nonproductive and dry that becomes productive gets treated with steroids. Feels chest tightness R>L that prevents full breath. Has had wheezing during night and activity. Associated with coughing. Reports reflux  associated with stress. Cough improved cold air.  02/06/23 Presents for follow-up for PFT results. Since our last visit she was started on Advair  and happy with her current regimen. Had a few sinus infections without leading to respiratory issues and cleared with Augumentin without prolonged symptoms and did not rescue inhaler. Significantly improved cough, shortness of breath, and chest tightness. Some chest congestion that usually worsens in the summer. Able to exercise. Is taking reflux medication but has stopped with worsening symptoms.  Social History: [place] up [place] farmhouse [place] early [place] during childhood Fireplace and coal stove for heating at 18 years  Past Medical History:  Diagnosis Date   Allergic rhinitis 05/03/2015   Anxiety    Asthma    exercise-induced; prn inhaler   Chronic infection of sinus 05/03/2015   Community acquired pneumonia 03/08/2021   Depression    on zoloft , doing well, cut dose.   Eczema    Fibular avulsion fracture, ATFL sprain right ankle 11/16/2020   Fibular avulsion fracture, ATFL sprain right ankle 11/16/2020   Headache(784.0)    sinus   History of abnormal cervical Pap smear 07/26/2019   History of MRSA infection 2008   left leg   History of pre-eclampsia 07/26/2019   HPV in female    Infection    UTI   Insect bites 08/30/2013   IT band syndrome 05/03/2015   NSVD (normal spontaneous vaginal delivery) 09/17/2018   Obesity (BMI 30-39.9) 05/03/2015   PIH (pregnancy induced hypertension), third trimester 09/16/2018   Preeclampsia, severe, third trimester 09/16/2018   Recurrent knee pain 05/03/2015   Recurrent upper respiratory infection (URI)  Seasonal allergies    Subacute cough 04/29/2021   Surgical wound, non healing 08/2013   left breast   Tendinitis, de Quervain's 05/08/2017   Uses condoms for contraception 05/23/2015   Vaginal Pap smear, abnormal      Family History  Problem Relation Age of Onset   Hypothyroidism Mother     Thyroid  disease Mother    Eczema Father    Allergic rhinitis Father    Hyperlipidemia Father    Hypertension Father    Depression Father    Asthma Sister    Eczema Sister    Allergic rhinitis Sister    Eczema Brother    Asthma Brother    Allergic rhinitis Brother    Stroke Maternal Grandmother    Cancer Maternal Grandfather        leukemia   Cancer Paternal Grandfather        non-hodgkins lymphoma     Social History   Occupational History   Not on file  Tobacco Use   Smoking status: Never    Passive exposure: Past   Smokeless tobacco: Never  Vaping Use   Vaping status: Never Used  Substance and Sexual Activity   Alcohol use: Not Currently    Comment: rarely   Drug use: No   Sexual activity: Yes    Birth control/protection: None    Allergies  Allergen Reactions   Polytrim [Polymyxin B-Trimethoprim] Other (See Comments)    OPHTHALMIC - FACIAL/EYE SWELLING     Outpatient Medications Prior to Visit  Medication Sig Dispense Refill   albuterol  (VENTOLIN  HFA) 108 (90 Base) MCG/ACT inhaler Inhale 1-2 puffs into the lungs every 6 (six) hours as needed for wheezing or shortness of breath. 8 g 0   amoxicillin -clavulanate (AUGMENTIN ) 875-125 MG tablet Take 1 tablet by mouth 2 (two) times daily. 14 tablet 0   Cholecalciferol (VITAMIN D3) 1.25 MG (50000 UT) CAPS Take 1 capsule by mouth daily.     fluticasone  (FLONASE ) 50 MCG/ACT nasal spray Place 2 sprays into both nostrils daily. 16 g 6   ipratropium (ATROVENT ) 0.03 % nasal spray Place 2 sprays into both nostrils every 12 (twelve) hours. 30 mL 5   ipratropium-albuterol  (DUONEB) 0.5-2.5 (3) MG/3ML SOLN Take 3 mLs by nebulization every 6 (six) hours as needed. 90 mL 7   levocetirizine (XYZAL) 5 MG tablet Take 5 mg by mouth every evening.     mupirocin  ointment (BACTROBAN ) 2 % Apply 1 Application topically 2 (two) times daily. 22 g 0   omeprazole  (PRILOSEC) 20 MG capsule Take 1 capsule (20 mg total) by mouth daily. 30 capsule  2   ondansetron  (ZOFRAN ) 4 MG tablet Take 1 tablet by mouth every 8  hours as needed for nausea or vomiting. 20 tablet 3   sertraline  (ZOLOFT ) 100 MG tablet Take 1 tablet (100 mg total) by mouth daily. 90 tablet 3   triamcinolone  ointment (KENALOG ) 0.5 % Apply 1 Application topically 2 (two) times daily as needed. 30 g 1   fluticasone -salmeterol (ADVAIR  HFA) 115-21 MCG/ACT inhaler Inhale 2 puffs into the lungs 2 (two) times daily. 12 g 12   nitrofurantoin , macrocrystal-monohydrate, (MACROBID ) 100 MG capsule Take 1 capsule (100 mg total) by mouth 2 (two) times daily. 10 capsule 0   No facility-administered medications prior to visit.    Review of Systems  Constitutional:  Negative for chills, diaphoresis, fever, malaise/fatigue and weight loss.  HENT:  Positive for congestion.   Respiratory:  Negative for cough, hemoptysis, sputum production, shortness of  breath and wheezing.   Cardiovascular:  Negative for chest pain, palpitations and leg swelling.     Objective:   There were no vitals filed for this visit.     Physical Exam: General: Well-appearing, no acute distress HENT: [place], AT Eyes: EOMI, no scleral icterus Respiratory: No respiratory distress Neuro: AAO x4, CNII-XII grossly intact Psych: Normal mood, normal affect  Data Reviewed:  Imaging: CXR 10/02/22 - no infiltrates, effusion, edema  PFT: 02/06/23 FVC 3.22 (88%) FEV1 2.81 (93%) Ratio 86  TLC 104% DLCO 118% Interpretation: No obstructive or restrictive defect. Normal PFTs  Labs: CBC    Component Value Date/Time   WBC 8.8 10/13/2022 1447   WBC 9.2 10/03/2022 0553   RBC 4.72 10/13/2022 1447   RBC 3.96 10/03/2022 0553   HGB 13.7 10/13/2022 1447   HCT 42.3 10/13/2022 1447   PLT 335 10/13/2022 1447   MCV 90 10/13/2022 1447   MCH 29.0 10/13/2022 1447   MCH 30.6 10/03/2022 0553   MCHC 32.4 10/13/2022 1447   MCHC 34.8 10/03/2022 0553   RDW 13.6 10/13/2022 1447   LYMPHSABS 3.0 10/13/2022 1447   MONOABS 0.7  07/29/2018 2305   EOSABS 0.1 10/13/2022 1447   BASOSABS 0.0 10/13/2022 1447        Assessment & Plan:   Discussion: 38 year old female never smoker with childhood asthma, allergic rhinitis, anxiety, HLD who presents for follow-up. Improved on medium dose ICS/LABA. Reviewed PFTs which are overall normal with partial bronchodilator response. Still benefits clinically from inhalers so continue this. Pharmacy investigation for lower costs.   Chronic cough Mild intermittent asthma - improved on bronchodilators Allergic rhinitis --Change to generic Advair  Diskus 100 puff in the morning and evening. Rinse mouth out after use --Reviewed pulmonary function tests. Overall normal.  --Allergies: Continue xyzal. START atrovent  in the morning and afternoon as needed. Continue flonase  nightly --Reflux: CONTINUE omeprazole  20 mg as needed   Health Maintenance Immunization History  Administered Date(s) Administered   Influenza, Seasonal, Injecte, Preservative Fre 11/27/2022   Influenza,inj,Quad PF,6+ Mos 11/12/2018, 12/06/2020   Influenza-Unspecified 10/14/2017, 09/27/2021   PFIZER(Purple Top)SARS-COV-2 Vaccination 02/16/2019, 03/09/2019, 01/26/2020   Tdap 01/27/2009, 07/02/2018   CT Lung Screen - never smoker. Not qualified  No orders of the defined types were placed in this encounter.  Meds ordered this encounter  Medications   fluticasone -salmeterol (ADVAIR ) 100-50 MCG/ACT AEPB    Sig: Inhale 1 puff into the lungs 2 (two) times daily.    Dispense:  60 each    Refill:  11    Generic Advair     Return in about 8 months (around 10/07/2023).  I have spent a total time of 30-minutes on the day of the appointment including chart review, data review, collecting history, coordinating care and discussing medical diagnosis and plan with the patient/family. Past medical history, allergies, medications were reviewed. Pertinent imaging, labs and tests included in this note have been reviewed and  interpreted independently by me.  Jacey Pelc Slater Staff, MD [facility] 02/06/2023 11:23 AM

## 2023-02-10 ENCOUNTER — Ambulatory Visit (INDEPENDENT_AMBULATORY_CARE_PROVIDER_SITE_OTHER): Payer: 59

## 2023-02-17 ENCOUNTER — Ambulatory Visit: Payer: 59 | Admitting: Internal Medicine

## 2023-02-17 ENCOUNTER — Ambulatory Visit (HOSPITAL_COMMUNITY)
Admission: RE | Admit: 2023-02-17 | Discharge: 2023-02-17 | Disposition: A | Discharge status: Home / Self Care | Payer: Commercial Managed Care - PPO | Source: Ambulatory Visit | Attending: Otolaryngology | Admitting: Otolaryngology

## 2023-02-17 DIAGNOSIS — J328 Other chronic sinusitis: Secondary | ICD-10-CM | POA: Diagnosis not present

## 2023-02-17 DIAGNOSIS — J343 Hypertrophy of nasal turbinates: Secondary | ICD-10-CM | POA: Insufficient documentation

## 2023-02-17 DIAGNOSIS — J3089 Other allergic rhinitis: Secondary | ICD-10-CM | POA: Diagnosis not present

## 2023-02-17 DIAGNOSIS — R0981 Nasal congestion: Secondary | ICD-10-CM | POA: Diagnosis not present

## 2023-02-17 DIAGNOSIS — J329 Chronic sinusitis, unspecified: Secondary | ICD-10-CM | POA: Diagnosis not present

## 2023-02-18 ENCOUNTER — Other Ambulatory Visit (HOSPITAL_COMMUNITY): Payer: Self-pay

## 2023-03-09 ENCOUNTER — Other Ambulatory Visit: Payer: Self-pay

## 2023-03-09 ENCOUNTER — Other Ambulatory Visit (HOSPITAL_BASED_OUTPATIENT_CLINIC_OR_DEPARTMENT_OTHER): Payer: Self-pay | Admitting: Nurse Practitioner

## 2023-03-09 ENCOUNTER — Other Ambulatory Visit (HOSPITAL_COMMUNITY): Payer: Self-pay

## 2023-03-09 ENCOUNTER — Other Ambulatory Visit: Payer: Self-pay | Admitting: Nurse Practitioner

## 2023-03-09 DIAGNOSIS — F419 Anxiety disorder, unspecified: Secondary | ICD-10-CM

## 2023-03-09 DIAGNOSIS — E782 Mixed hyperlipidemia: Secondary | ICD-10-CM

## 2023-03-09 DIAGNOSIS — Z6841 Body Mass Index (BMI) 40.0 and over, adult: Secondary | ICD-10-CM

## 2023-03-09 MED ORDER — ONDANSETRON HCL 4 MG PO TABS
4.0000 mg | ORAL_TABLET | Freq: Three times a day (TID) | ORAL | 3 refills | Status: DC | PRN
Start: 2023-03-09 — End: 2023-08-25
  Filled 2023-03-09: qty 20, 7d supply, fill #0
  Filled 2023-06-11: qty 20, 7d supply, fill #1

## 2023-03-09 MED ORDER — SERTRALINE HCL 100 MG PO TABS
100.0000 mg | ORAL_TABLET | Freq: Every day | ORAL | 3 refills | Status: DC
  Filled 2023-03-09: qty 90, 90d supply, fill #0
  Filled 2023-06-11: qty 90, 90d supply, fill #1

## 2023-03-10 ENCOUNTER — Other Ambulatory Visit: Payer: Self-pay

## 2023-03-12 ENCOUNTER — Ambulatory Visit (INDEPENDENT_AMBULATORY_CARE_PROVIDER_SITE_OTHER): Payer: 59

## 2023-04-20 ENCOUNTER — Telehealth (INDEPENDENT_AMBULATORY_CARE_PROVIDER_SITE_OTHER): Payer: Self-pay | Admitting: Otolaryngology

## 2023-04-20 NOTE — Telephone Encounter (Signed)
 Reminder Call:  Date: 04/21/2023 Status: Sch  Time: 4:00 PM Left Voicemail message w/time and location-[address] [place], [place] [address]

## 2023-04-21 ENCOUNTER — Ambulatory Visit (INDEPENDENT_AMBULATORY_CARE_PROVIDER_SITE_OTHER): Payer: 59

## 2023-05-03 ENCOUNTER — Encounter (HOSPITAL_BASED_OUTPATIENT_CLINIC_OR_DEPARTMENT_OTHER): Payer: Self-pay

## 2023-05-03 ENCOUNTER — Emergency Department (HOSPITAL_BASED_OUTPATIENT_CLINIC_OR_DEPARTMENT_OTHER)
Admission: EM | Admit: 2023-05-03 | Discharge: 2023-05-03 | Disposition: A | Discharge status: Home / Self Care | Attending: Emergency Medicine | Admitting: Emergency Medicine

## 2023-05-03 DIAGNOSIS — N644 Mastodynia: Secondary | ICD-10-CM | POA: Diagnosis present

## 2023-05-03 DIAGNOSIS — N611 Abscess of the breast and nipple: Secondary | ICD-10-CM | POA: Diagnosis not present

## 2023-05-03 MED ORDER — CLINDAMYCIN HCL 300 MG PO CAPS: 300.0000 mg | ORAL_CAPSULE | Freq: Four times a day (QID) | ORAL | 0 refills | Status: DC

## 2023-05-03 NOTE — Discharge Instructions (Addendum)
 Call to make an appointment to follow-up with the breast center tomorrow.  If you have any difficulty getting into the breast center, call Dr. Ardine Eng office.  Return to the emergency room if you have any worsening symptoms.

## 2023-05-03 NOTE — ED Triage Notes (Signed)
 Pt has a left breast cyst that is sore and is causing her breast to become red. Pt has hx of previous cyst with I&D. Pt noted soreness on Thursday. States that she was taking some doxycycline that she had at home to treat.

## 2023-05-03 NOTE — ED Provider Notes (Signed)
 [place] [facility] Provider Note   CSN: [id number] Arrival date & time: 05/03/23  0755     History  Chief Complaint  Patient presents with   Breast Problem    Deanna Gross is a 38 y.o. female.  Patient is a 38 year old female who presents with concerns for a breast infection.  She states it started about 2 or 3 days ago as a little knot and seems to have gotten larger.  The redness is spreading.  She had some doxycycline at home from a prior infection and she has been taking it for 2 days.  She denies any known fevers.  She says she has a prior history of an abscess in the same location that was I&D need about 10+ years ago.  On chart review, she was seen in 2015 for an infected cyst to her left breast.  She initially was seen at [facility] urgent care where she had an I&D done.  Subsequently was seen by Dr. Gerrit Friends.  She had a repeat I&D in the office.  She then went to the OR to have the cyst removed.  It became reinfected and she had to go back to the OR a second time.  It appears that the pathology was negative for any malignancy.         Home Medications Prior to Admission medications   Medication Sig Start Date End Date Taking? Authorizing Provider  clindamycin (CLEOCIN) 300 MG capsule Take 1 capsule (300 mg total) by mouth 4 (four) times daily. X 7 days 05/03/23  Yes Rolan Bucco, MD  albuterol (VENTOLIN HFA) 108 (90 Base) MCG/ACT inhaler Inhale 1-2 puffs into the lungs every 6 (six) hours as needed for wheezing or shortness of breath. 09/24/22   Freddy Finner, NP  amoxicillin-clavulanate (AUGMENTIN) 875-125 MG tablet Take 1 tablet by mouth 2 (two) times daily. 01/27/23   Margaretann Loveless, PA-C  Cholecalciferol (VITAMIN D3) 1.25 MG (50000 UT) CAPS Take 1 capsule by mouth daily.    [provider]  fluticasone (FLONASE) 50 MCG/ACT nasal spray Place 2 sprays into both nostrils daily. 02/08/22   Viviano Simas, FNP   fluticasone-salmeterol (ADVAIR) 100-50 MCG/ACT AEPB Inhale 1 puff into the lungs 2 (two) times daily. 02/06/23   Luciano Cutter, MD  ipratropium (ATROVENT) 0.03 % nasal spray Place 2 sprays into both nostrils every 12 (twelve) hours. 10/10/22   Luciano Cutter, MD  ipratropium-albuterol (DUONEB) 0.5-2.5 (3) MG/3ML SOLN Take 3 mLs by nebulization every 6 (six) hours as needed. 10/01/22   Alyson Reedy, FNP  levocetirizine (XYZAL) 5 MG tablet Take 5 mg by mouth every evening.    [provider]  mupirocin ointment (BACTROBAN) 2 % Apply 1 Application topically 2 (two) times daily. 11/14/22   Margaretann Loveless, PA-C  omeprazole (PRILOSEC) 20 MG capsule Take 1 capsule (20 mg total) by mouth daily. 10/10/22   Luciano Cutter, MD  ondansetron (ZOFRAN) 4 MG tablet Take 1 tablet (4 mg total) by mouth every 8 (eight) hours as needed for nausea or vomiting. 03/09/23   Caudle, Shelton Silvas, FNP  sertraline (ZOLOFT) 100 MG tablet Take 1 tablet (100 mg total) by mouth daily. 03/09/23   Caudle, Shelton Silvas, FNP  triamcinolone ointment (KENALOG) 0.5 % Apply 1 Application topically 2 (two) times daily as needed. 12/12/22   Ferol Luz, MD      Allergies    Polytrim [polymyxin b-trimethoprim]    Review of Systems  Review of Systems  Constitutional:  Negative for fever.  Gastrointestinal:  Negative for nausea and vomiting.  Skin:  Positive for color change. Negative for wound.  Neurological:  Negative for weakness, numbness and headaches.    Physical Exam Updated Vital Signs BP (!) 150/99 (BP Location: Right Arm)   Pulse 72   Temp 98.3 F (36.8 C) (Oral)   Resp 18   Ht 5\' 3"  (1.6 m)   Wt 111.1 kg   LMP 04/20/2023 (Approximate)   SpO2 98%   BMI 43.40 kg/m  Physical Exam Constitutional:      Appearance: She is well-developed.  HENT:     Head: Normocephalic and atraumatic.  Cardiovascular:     Rate and Rhythm: Normal rate.  Pulmonary:     Effort: Pulmonary effort is  normal.  Musculoskeletal:        General: No tenderness.     Cervical back: Normal range of motion and neck supple.  Skin:    General: Skin is warm and dry.     Comments: Patient has a tender indurated area to the medial aspect of her left breast.  It is about 3 cm in diameter.  I do not feel any obvious fluctuance.  The area is mildly erythematous.  She has some erythema extending about 3 cm toward her nipple.  No open wounds.  No draining wounds.  Neurological:     Mental Status: She is alert and oriented to person, place, and time.     ED Results / Procedures / Treatments   Labs (all labs ordered are listed, but only abnormal results are displayed) Labs Reviewed - No data to display  EKG None  Radiology No results found.  Procedures Procedures    Medications Ordered in ED Medications - No data to display  ED Course/ Medical Decision Making/ A&P                                 Medical Decision Making Risk Prescription drug management.   Patient with a breast abscess.  It is in the same area where an infected sebaceous cyst was removed.  Likely is a similarly infected cystic abscess.  However given that it is a breast abscess, will start her on clindamycin and refer her to follow-up tomorrow in the breast center.  She has been seen in the breast center before.  She does not appear systemically ill.  She declines the need for any opioid pain medication.  Return precautions were given.  Final Clinical Impression(s) / ED Diagnoses Final diagnoses:  Breast abscess    Rx / DC Orders ED Discharge Orders          Ordered    clindamycin (CLEOCIN) 300 MG capsule  4 times daily        05/03/23 0835              Rolan Bucco, MD 05/03/23 [phone]

## 2023-05-04 ENCOUNTER — Ambulatory Visit: Payer: Self-pay

## 2023-05-04 ENCOUNTER — Other Ambulatory Visit: Payer: Self-pay | Admitting: Family Medicine

## 2023-05-04 DIAGNOSIS — N611 Abscess of the breast and nipple: Secondary | ICD-10-CM

## 2023-05-04 NOTE — Telephone Encounter (Signed)
 Copied from CRM [id number]. Topic: Clinical - Red Word Triage >> May 04, 2023  8:09 AM Everette C wrote: [facility] that prompted transfer to Nurse Triage: The patient has a recurring cyst located under their breast that has caused them a significant chest infection, the patient was seen in the ED yesterday 05/03/23 and has been directed to contact their PCP    Chief Complaint: Left breast infection, seen in ED 05/03/23 Symptoms: Pain, swelling redness Frequency: 1 week Pertinent Negatives: Patient denies fever Disposition: [] ED /[] Urgent Care (no appt availability in office) / [] Appointment(In office/virtual)/ []  [place] [facility]/ [] Home Care/ [] Refused Recommended Disposition /[] [place] [facility]/ []  Follow-up with PCP Additional Notes: Pt. Will need to follow up at [facility] per ED note. Call disconnected. Called pt. Back and left detailed message.  Reason for Disposition  SEVERE pain (e.g., excruciating)  Answer Assessment - Initial Assessment Questions 1. APPEARANCE of BOIL: "What does the boil look like?"      6 inches of redness, hard with knot 2. LOCATION: "Where is the boil located?"      Left side  3. NUMBER: "How many boils are there?"      1 4. SIZE: "How big is the boil?" (e.g., inches, cm; compare to size of a coin or other object)     6 inches 5. ONSET: "When did the boil start?"     Thursday 6. PAIN: "Is there any pain?" If Yes, ask: "How bad is the pain?"   (Scale 1-10; or mild, moderate, severe)     Now - 4 7. FEVER: "Do you have a fever?" If Yes, ask: "What is it, how was it measured, and when did it start?"      None today 8. SOURCE: "Have you been around anyone with boils or other Staph infections?" "Have you ever had boils before?"     No 9. OTHER SYMPTOMS: "Do you have any other symptoms?" (e.g., shaking chills, weakness, rash elsewhere on body)     No 10. PREGNANCY: "Is there any chance you are pregnant?" "When was your last menstrual period?"        No  Protocols used: Boil (Skin Abscess)-A-AH

## 2023-06-08 ENCOUNTER — Encounter (HOSPITAL_BASED_OUTPATIENT_CLINIC_OR_DEPARTMENT_OTHER): Payer: Self-pay | Admitting: Family Medicine

## 2023-06-08 NOTE — Telephone Encounter (Signed)
 Please see mychart message sent by pt and advise.

## 2023-06-12 ENCOUNTER — Other Ambulatory Visit (HOSPITAL_COMMUNITY): Payer: Self-pay

## 2023-08-03 ENCOUNTER — Other Ambulatory Visit (HOSPITAL_COMMUNITY): Payer: Self-pay

## 2023-08-25 ENCOUNTER — Other Ambulatory Visit (HOSPITAL_BASED_OUTPATIENT_CLINIC_OR_DEPARTMENT_OTHER): Payer: Self-pay

## 2023-08-25 ENCOUNTER — Encounter (HOSPITAL_BASED_OUTPATIENT_CLINIC_OR_DEPARTMENT_OTHER): Payer: Self-pay | Admitting: Family Medicine

## 2023-08-25 ENCOUNTER — Ambulatory Visit (INDEPENDENT_AMBULATORY_CARE_PROVIDER_SITE_OTHER): Admitting: Family Medicine

## 2023-08-25 VITALS — BP 128/85 | HR 71 | Ht 63.0 in | Wt 249.4 lb

## 2023-08-25 DIAGNOSIS — Z6841 Body Mass Index (BMI) 40.0 and over, adult: Secondary | ICD-10-CM

## 2023-08-25 DIAGNOSIS — F419 Anxiety disorder, unspecified: Secondary | ICD-10-CM | POA: Diagnosis not present

## 2023-08-25 DIAGNOSIS — Z0000 Encounter for general adult medical examination without abnormal findings: Secondary | ICD-10-CM

## 2023-08-25 DIAGNOSIS — Z8349 Family history of other endocrine, nutritional and metabolic diseases: Secondary | ICD-10-CM | POA: Insufficient documentation

## 2023-08-25 DIAGNOSIS — E782 Mixed hyperlipidemia: Secondary | ICD-10-CM

## 2023-08-25 MED ORDER — ONDANSETRON HCL 4 MG PO TABS
4.0000 mg | ORAL_TABLET | Freq: Three times a day (TID) | ORAL | 3 refills | Status: AC | PRN
  Filled 2023-08-25 – 2023-12-17 (×2): qty 20, 7d supply, fill #0

## 2023-08-25 MED ORDER — SERTRALINE HCL 100 MG PO TABS
100.0000 mg | ORAL_TABLET | Freq: Every day | ORAL | 3 refills | Status: AC
  Filled 2023-08-25 – 2023-09-13 (×2): qty 90, 90d supply, fill #0
  Filled 2023-12-17: qty 90, 90d supply, fill #1
  Filled 2023-12-18: qty 90, 90d supply, fill #0

## 2023-08-25 NOTE — Patient Instructions (Signed)

## 2023-08-25 NOTE — Progress Notes (Signed)
 Subjective:   Deanna Gross 1985-04-07  08/25/2023   CC: Chief Complaint  Patient presents with   Annual Exam    Physical    HPI: Deanna Gross is a 38 y.o. female who presents for a routine health maintenance exam.  Labs collected at time of visit.    WEIGHT CONCERN:  Patient is desiring to lose weight and interested in GLP-1 medication.  She previously tolerated Ozempic  for weight loss, not DM in the past and had significant improvement.  Patient reports a possible history of PCOS due to history of irregular periods, ovarian cyst presents, and hirsutism.    HEALTH SCREENINGS: - Vision Screening: Recommended - Dental Visits: up to date - Pap smear: up to date - Breast Exam: Declined - STD Screening: Declined - Mammogram (40+): Not applicable  - Colonoscopy (45+): Not applicable  - Bone Density (65+ or under 65 with predisposing conditions): Not applicable  - Lung CA screening with low-dose CT:  Not applicable Adults age 34-80 who are current cigarette smokers or quit within the last 15 years. Must have 20 pack year history.   Depression and Anxiety Screen done today and results listed below:     12/11/2022    2:24 PM 10/13/2022    2:15 PM 08/12/2022    3:25 PM 04/25/2021    8:10 AM 01/01/2021   12:07 PM  Depression screen PHQ 2/9  Decreased Interest 0 0 0 0 0  Down, Depressed, Hopeless 0 0 0 0 0  PHQ - 2 Score 0 0 0 0 0  Altered sleeping  0 1  1  Tired, decreased energy  3 1  1   Change in appetite  0 1  0  Feeling bad or failure about yourself   0 0  0  Trouble concentrating  0 1  0  Moving slowly or fidgety/restless  0 0  0  Suicidal thoughts  0 0  0  PHQ-9 Score  3 4  2   Difficult doing work/chores  Not difficult at all Somewhat difficult  Not difficult at all      10/13/2022    2:16 PM 08/12/2022    3:25 PM 01/01/2021   12:07 PM 07/26/2019    1:50 PM  GAD 7 : Generalized Anxiety Score  Nervous, Anxious, on Edge 2 2 2 1   Control/stop worrying 0 1 2 0   Worry too much - different things 2 2 2  0  Trouble relaxing 0 1 0 0  Restless 0 0 0 0  Easily annoyed or irritable 1 2 2 1   Afraid - awful might happen 0 1 2 0  Total GAD 7 Score 5 9 10 2   Anxiety Difficulty Not difficult at all Somewhat difficult Somewhat difficult Not difficult at all    IMMUNIZATIONS: - Tdap: Tetanus vaccination status reviewed: last tetanus booster within 10 years. - HPV: Declined - Influenza: Postponed to flu season - Pneumovax: Not applicable - Prevnar 20: Not applicable - Shingrix (50+): Not applicable   Past medical history, surgical history, medications, allergies, family history and social history reviewed with patient today and changes made to appropriate areas of the chart.   Past Medical History:  Diagnosis Date   Allergic rhinitis 05/03/2015   Anxiety    Asthma    exercise-induced; prn inhaler   Chronic infection of sinus 05/03/2015   Community acquired pneumonia 03/08/2021   Depression    on zoloft , doing well, cut dose.   Eczema  Fibular avulsion fracture, ATFL sprain right ankle 11/16/2020   Fibular avulsion fracture, ATFL sprain right ankle 11/16/2020   Headache(784.0)    sinus   History of abnormal cervical Pap smear 07/26/2019   History of MRSA infection 2008   left leg   History of pre-eclampsia 07/26/2019   HPV in female    Infection    UTI   Insect bites 08/30/2013   IT band syndrome 05/03/2015   NSVD (normal spontaneous vaginal delivery) 09/17/2018   Obesity (BMI 30-39.9) 05/03/2015   PIH (pregnancy induced hypertension), third trimester 09/16/2018   Preeclampsia, severe, third trimester 09/16/2018   Recurrent knee pain 05/03/2015   Recurrent upper respiratory infection (URI)    Seasonal allergies    Subacute cough 04/29/2021   Surgical wound, non healing 08/2013   left breast   Tendinitis, de Quervain's 05/08/2017   Uses condoms for contraception 05/23/2015   Vaginal Pap smear, abnormal     Past Surgical History:   Procedure Laterality Date   ADENOIDECTOMY     BREAST CYST EXCISION Left 03/18/2013   Procedure: EXCISE CYST LEFT MEDIAL BREAST;  Surgeon: Krystal CHRISTELLA Spinner, MD;  Location: [facility];  Service: General;  Laterality: Left;   MASS EXCISION Left 09/01/2013   Procedure: RE-EXCISION NON HEALING WOUND LEFT BREAST;  Surgeon: Krystal CHRISTELLA Spinner, MD;  Location: [facility];  Service: General;  Laterality: Left;   TONSILLECTOMY     TONSILLECTOMY AND ADENOIDECTOMY     TYMPANOSTOMY TUBE PLACEMENT     x 3   WISDOM TOOTH EXTRACTION      Current Outpatient Medications on File Prior to Visit  Medication Sig   albuterol  (VENTOLIN  HFA) 108 (90 Base) MCG/ACT inhaler Inhale 1-2 puffs into the lungs every 6 (six) hours as needed for wheezing or shortness of breath.   Cholecalciferol (VITAMIN D3) 1.25 MG (50000 UT) CAPS Take 1 capsule by mouth daily.   fluticasone  (FLONASE ) 50 MCG/ACT nasal spray Place 2 sprays into both nostrils daily.   fluticasone -salmeterol (ADVAIR ) 100-50 MCG/ACT AEPB Inhale 1 puff into the lungs 2 (two) times daily.   ipratropium (ATROVENT ) 0.03 % nasal spray Place 2 sprays into both nostrils every 12 (twelve) hours.   ipratropium-albuterol  (DUONEB) 0.5-2.5 (3) MG/3ML SOLN Take 3 mLs by nebulization every 6 (six) hours as needed.   levocetirizine (XYZAL) 5 MG tablet Take 5 mg by mouth every evening.   mupirocin  ointment (BACTROBAN ) 2 % Apply 1 Application topically 2 (two) times daily.   omeprazole  (PRILOSEC) 20 MG capsule Take 1 capsule (20 mg total) by mouth daily.   triamcinolone  ointment (KENALOG ) 0.5 % Apply 1 Application topically 2 (two) times daily as needed.   No current facility-administered medications on file prior to visit.    Allergies  Allergen Reactions   Polytrim [Polymyxin B-Trimethoprim] Other (See Comments)    OPHTHALMIC - FACIAL/EYE SWELLING     Social History   Socioeconomic History   Marital status: Married    Spouse name: Not on  file   Number of children: Not on file   Years of education: Not on file   Highest education level: Master's degree (e.g., MA, MS, MEng, MEd, MSW, MBA)  Occupational History   Not on file  Tobacco Use   Smoking status: Never    Passive exposure: Past   Smokeless tobacco: Never  Vaping Use   Vaping status: Never Used  Substance and Sexual Activity   Alcohol use: Not Currently    Comment: rarely  Drug use: No   Sexual activity: Yes    Birth control/protection: None  Other Topics Concern   Not on file  Social History Narrative   Not on file   Social Drivers of Health   Financial Resource Strain: Low Risk  (12/11/2022)   Overall Financial Resource Strain (CARDIA)    Difficulty of Paying Living Expenses: Not very hard  Food Insecurity: No Food Insecurity (12/11/2022)   Hunger Vital Sign    Worried About Running Out of Food in the Last Year: Never true    Ran Out of Food in the Last Year: Never true  Transportation Needs: No Transportation Needs (12/11/2022)   PRAPARE - Administrator, Civil Service (Medical): No    Lack of Transportation (Non-Medical): No  Physical Activity: Insufficiently Active (12/11/2022)   Exercise Vital Sign    Days of Exercise per Week: 2 days    Minutes of Exercise per Session: 30 min  Stress: Stress Concern Present (12/11/2022)   [employer] of Occupational Health - Occupational Stress Questionnaire    Feeling of Stress : To some extent  Social Connections: Socially Integrated (12/11/2022)   Social Connection and Isolation Panel    Frequency of Communication with Friends and Family: More than three times a week    Frequency of Social Gatherings with Friends and Family: Once a week    Attends Religious Services: More than 4 times per year    Active Member of [employer] or Organizations: Yes    Attends Banker Meetings: 1 to 4 times per year    Marital Status: Married  Catering manager Violence: Not At Risk (10/02/2022)    Humiliation, Afraid, Rape, and Kick questionnaire    Fear of Current or Ex-Partner: No    Emotionally Abused: No    Physically Abused: No    Sexually Abused: No   Social History   Tobacco Use  Smoking Status Never   Passive exposure: Past  Smokeless Tobacco Never   Social History   Substance and Sexual Activity  Alcohol Use Not Currently   Comment: rarely    Family History  Problem Relation Age of Onset   Hypothyroidism Mother    Thyroid  disease Mother    Eczema Father    Allergic rhinitis Father    Hyperlipidemia Father    Hypertension Father    Depression Father    Asthma Sister    Eczema Sister    Allergic rhinitis Sister    Eczema Brother    Asthma Brother    Allergic rhinitis Brother    Stroke Maternal Grandmother    Cancer Maternal Grandfather        leukemia   Cancer Paternal Grandfather        non-hodgkins lymphoma     ROS: Denies fever, fatigue, unexplained weight loss/gain, chest pain, SHOB, and palpitations. Denies neurological deficits, gastrointestinal or genitourinary complaints, and skin changes.   Objective:   Today's Vitals   08/25/23 1440  BP: 128/85  Pulse: 71  SpO2: 98%  Weight: 249 lb 6.4 oz (113.1 kg)  Height: 5' 3 (1.6 m)  PainSc: 0-No pain    GENERAL APPEARANCE: Well-appearing, in NAD. Well nourished.  SKIN: Pink, warm and dry. Turgor normal. No rash, lesion, ulceration, or ecchymoses. Hair evenly distributed.  HEENT: HEAD: Normocephalic.  EYES: PERRLA. EOMI. Lids intact w/o defect. Sclera white, Conjunctiva pink w/o exudate.  EARS: External ear w/o redness, swelling, masses or lesions. EAC clear. TM's intact, translucent w/o bulging, appropriate  landmarks visualized. Appropriate acuity to conversational tones.  NOSE: Septum midline w/o deformity. Nares patent, mucosa pink and non-inflamed w/o drainage. No sinus tenderness.  THROAT: Uvula midline. Oropharynx clear. Tonsils non-inflamed w/o exudate. Oral mucosa pink and moist.   NECK: Supple, Trachea midline. Full ROM w/o pain or tenderness. No lymphadenopathy. Thyroid  non-tender w/o enlargement or palpable masses.  RESPIRATORY: Chest wall symmetrical w/o masses. Respirations even and non-labored. Breath sounds clear to auscultation bilaterally. No wheezes, rales, rhonchi, or crackles. CARDIAC: S1, S2 present, regular rate and rhythm. No gallops, murmurs, rubs, or clicks. PMI w/o lifts, heaves, or thrills. No carotid bruits. Capillary refill <2 seconds. Peripheral pulses 2+ bilaterally. GI: Abdomen soft w/o distention. Normoactive bowel sounds. No palpable masses or tenderness. No guarding or rebound tenderness. Liver and spleen w/o tenderness or enlargement. No CVA tenderness.  MSK: Muscle tone and strength appropriate for age, w/o atrophy or abnormal movement.  EXTREMITIES: Active ROM intact, w/o tenderness, crepitus, or contracture. No obvious joint deformities or effusions. No clubbing, edema, or cyanosis.  NEUROLOGIC: CN's II-XII intact. Motor strength symmetrical with no obvious weakness. No sensory deficits. DTR's 2+ symmetric bilaterally. Steady, even gait.  PSYCH/MENTAL STATUS: Alert, oriented x 3. Cooperative, appropriate mood and affect.    Assessment & Plan:  1. Annual physical exam (Primary) Patient doing well overall.  She is up-to-date on screenings and vaccinations.  Discussed possibility of HPV vaccine if she desires to complete this series.  She will return to complete fasting lab work this week and will be notified of results when available.  Recommend starting mammograms at age 75 and colonoscopies at age 68. - CBC with Differential/Platelet; Future - Comprehensive metabolic panel with GFR; Future  2. Anxiety Stable per patient.  Zoloft  refilled.  Safe use of medication reviewed with patient. - sertraline  (ZOLOFT ) 100 MG tablet; Take 1 tablet (100 mg total) by mouth daily.  Dispense: 90 tablet; Refill: 3  3. BMI 40.0-44.9, adult [facility]) We had a  lengthy discussion regarding possibility of insulin  resistance and possible PCOS contributing with difficulty losing weight.  Recommend low-carb, high-protein diet, strength resistance training, and possibility of starting GLP-1 pending lab work with med Environmental education officer and/or [facility] self directed pharmacy for Zepbound.  Will obtain lab work first and notify patient of results available.  Zofran  refilled per patient request before starting GLP-1. - ondansetron  (ZOFRAN ) 4 MG tablet; Take 1 tablet (4 mg total) by mouth every 8 (eight) hours as needed for nausea or vomiting.  Dispense: 20 tablet; Refill: 3 - Hemoglobin A1c; Future  4. Mixed hyperlipidemia Previously controlled with diet and exercise.  Will obtain lipid panel when fasting. - ondansetron  (ZOFRAN ) 4 MG tablet; Take 1 tablet (4 mg total) by mouth every 8 (eight) hours as needed for nausea or vomiting.  Dispense: 20 tablet; Refill: 3 - Lipid panel; Future  5. Family history of hypothyroidism Asymptomatic at this time.  Will obtain TSH with lab work. - TSH; Future   Orders Placed This Encounter  Procedures   CBC with Differential/Platelet    Standing Status:   Future    Expiration Date:   02/25/2024   Comprehensive metabolic panel with GFR    Standing Status:   Future    Expiration Date:   02/25/2024   Lipid panel    Standing Status:   Future    Expiration Date:   02/25/2024   TSH    Standing Status:   Future    Expiration Date:   02/25/2024  Hemoglobin A1c    Standing Status:   Future    Expiration Date:   08/24/2024    PATIENT COUNSELING:  - Encouraged a healthy well-balanced diet. Patient may adjust caloric intake to maintain or achieve ideal body weight. May reduce intake of dietary saturated fat and total fat and have adequate dietary potassium and calcium preferably from fresh fruits, vegetables, and low-fat dairy products.   - Advised to avoid cigarette smoking. - Discussed with the patient that most  people either abstain from alcohol or drink within safe limits (<=14/week and <=4 drinks/occasion for males, <=7/weeks and <= 3 drinks/occasion for females) and that the risk for alcohol disorders and other health effects rises proportionally with the number of drinks per week and how often a drinker exceeds daily limits. - Discussed cessation/primary prevention of drug use and availability of treatment for abuse.  - Discussed sexually transmitted diseases, avoidance of unintended pregnancy and contraceptive alternatives.  - Stressed the importance of regular exercise - Injury prevention: Discussed safety belts, safety helmets, smoke detector, smoking near bedding or upholstery.  - Dental health: Discussed importance of regular tooth brushing, flossing, and dental visits.   NEXT PREVENTATIVE PHYSICAL DUE IN 1 YEAR.  Return in about 3 months (around 11/25/2023) for Weight Follow up .  Patient to reach out to office if new, worrisome, or unresolved symptoms arise or if no improvement in patient's condition. Patient verbalized understanding and is agreeable to treatment plan. All questions answered to patient's satisfaction.    Thersia Schuyler Stark, [place]

## 2023-08-26 ENCOUNTER — Other Ambulatory Visit (HOSPITAL_BASED_OUTPATIENT_CLINIC_OR_DEPARTMENT_OTHER): Payer: Self-pay | Admitting: Family Medicine

## 2023-08-26 DIAGNOSIS — Z8349 Family history of other endocrine, nutritional and metabolic diseases: Secondary | ICD-10-CM

## 2023-08-26 DIAGNOSIS — Z6841 Body Mass Index (BMI) 40.0 and over, adult: Secondary | ICD-10-CM

## 2023-08-26 DIAGNOSIS — E782 Mixed hyperlipidemia: Secondary | ICD-10-CM | POA: Diagnosis not present

## 2023-08-26 DIAGNOSIS — Z0000 Encounter for general adult medical examination without abnormal findings: Secondary | ICD-10-CM | POA: Diagnosis not present

## 2023-08-26 NOTE — Addendum Note (Signed)
 Addended by: Jasmin Trumbull on: 08/26/2023 09:26 AM   Modules accepted: Level of Service

## 2023-08-27 ENCOUNTER — Ambulatory Visit (HOSPITAL_BASED_OUTPATIENT_CLINIC_OR_DEPARTMENT_OTHER): Payer: Self-pay | Admitting: Family Medicine

## 2023-08-27 LAB — HEMOGLOBIN A1C
Est. average glucose Bld gHb Est-mCnc: 114 mg/dL
Hgb A1c MFr Bld: 5.6 % (ref 4.8–5.6)

## 2023-08-27 LAB — LIPID PANEL
Chol/HDL Ratio: 3 ratio (ref 0.0–4.4)
Cholesterol, Total: 234 mg/dL — ABNORMAL HIGH (ref 100–199)
HDL: 77 mg/dL (ref >39)
LDL Chol Calc (NIH): 142 mg/dL — ABNORMAL HIGH (ref 0–99)
Triglycerides: 86 mg/dL (ref 0–149)
VLDL Cholesterol Cal: 15 mg/dL (ref 5–40)

## 2023-08-27 LAB — COMPREHENSIVE METABOLIC PANEL WITH GFR
ALT: 21 IU/L (ref 0–32)
AST: 22 IU/L (ref 0–40)
Albumin: 4.3 g/dL (ref 3.9–4.9)
Alkaline Phosphatase: 80 IU/L (ref 44–121)
BUN/Creatinine Ratio: 25 — ABNORMAL HIGH (ref 9–23)
BUN: 18 mg/dL (ref 6–20)
Bilirubin Total: 0.3 mg/dL (ref 0.0–1.2)
CO2: 21 mmol/L (ref 20–29)
Calcium: 9.2 mg/dL (ref 8.7–10.2)
Chloride: 103 mmol/L (ref 96–106)
Creatinine, Ser: 0.71 mg/dL (ref 0.57–1.00)
Globulin, Total: 2.5 g/dL (ref 1.5–4.5)
Glucose: 89 mg/dL (ref 70–99)
Potassium: 4.6 mmol/L (ref 3.5–5.2)
Sodium: 140 mmol/L (ref 134–144)
Total Protein: 6.8 g/dL (ref 6.0–8.5)
eGFR: 111 mL/min/1.73 (ref >59)

## 2023-08-27 LAB — CBC WITH DIFFERENTIAL/PLATELET
Basophils Absolute: 0 x10E3/uL (ref 0.0–0.2)
Basos: 1 %
EOS (ABSOLUTE): 0.1 x10E3/uL (ref 0.0–0.4)
Eos: 2 %
Hematocrit: 43.5 % (ref 34.0–46.6)
Hemoglobin: 13.6 g/dL (ref 11.1–15.9)
Immature Grans (Abs): 0 x10E3/uL (ref 0.0–0.1)
Immature Granulocytes: 0 %
Lymphocytes Absolute: 2.4 x10E3/uL (ref 0.7–3.1)
Lymphs: 47 %
MCH: 28 pg (ref 26.6–33.0)
MCHC: 31.3 g/dL — ABNORMAL LOW (ref 31.5–35.7)
MCV: 90 fL (ref 79–97)
Monocytes Absolute: 0.3 x10E3/uL (ref 0.1–0.9)
Monocytes: 6 %
Neutrophils Absolute: 2.3 x10E3/uL (ref 1.4–7.0)
Neutrophils: 44 %
Platelets: 308 x10E3/uL (ref 150–450)
RBC: 4.86 x10E6/uL (ref 3.77–5.28)
RDW: 14.1 % (ref 11.7–15.4)
WBC: 5.1 x10E3/uL (ref 3.4–10.8)

## 2023-08-27 LAB — TSH: TSH: 1.33 u[IU]/mL (ref 0.450–4.500)

## 2023-08-27 NOTE — Progress Notes (Signed)
 Hi Mazy,  Your cholesterol is slightly elevated from previous. Heart healthy diets and regular exercise can help to lower this risk of heart disease. Statin therapy is also an option to lower your heart disease risk. Your electrolytes, kidney and liver function is stable. Your CBC is normal. Your A1C did increase slightly and is on the border of prediabetes. If you are interested in the GLP 1 therapy, let me know. The pharmacy is not a [address] by the way.

## 2023-08-31 ENCOUNTER — Other Ambulatory Visit (HOSPITAL_BASED_OUTPATIENT_CLINIC_OR_DEPARTMENT_OTHER): Payer: Self-pay | Admitting: Family Medicine

## 2023-09-04 ENCOUNTER — Other Ambulatory Visit (HOSPITAL_BASED_OUTPATIENT_CLINIC_OR_DEPARTMENT_OTHER): Payer: Self-pay

## 2023-09-14 ENCOUNTER — Other Ambulatory Visit: Payer: Self-pay

## 2023-09-14 ENCOUNTER — Other Ambulatory Visit (HOSPITAL_COMMUNITY): Payer: Self-pay

## 2023-09-15 ENCOUNTER — Ambulatory Visit (INDEPENDENT_AMBULATORY_CARE_PROVIDER_SITE_OTHER): Admitting: Otolaryngology

## 2023-09-15 ENCOUNTER — Encounter (INDEPENDENT_AMBULATORY_CARE_PROVIDER_SITE_OTHER): Payer: Self-pay | Admitting: Otolaryngology

## 2023-09-15 VITALS — BP 123/80 | HR 72 | Ht 63.0 in | Wt 242.0 lb

## 2023-09-15 DIAGNOSIS — J0181 Other acute recurrent sinusitis: Secondary | ICD-10-CM

## 2023-09-15 DIAGNOSIS — R0981 Nasal congestion: Secondary | ICD-10-CM | POA: Diagnosis not present

## 2023-09-15 DIAGNOSIS — J343 Hypertrophy of nasal turbinates: Secondary | ICD-10-CM

## 2023-09-15 DIAGNOSIS — J3489 Other specified disorders of nose and nasal sinuses: Secondary | ICD-10-CM | POA: Diagnosis not present

## 2023-09-15 DIAGNOSIS — J342 Deviated nasal septum: Secondary | ICD-10-CM

## 2023-09-15 NOTE — Progress Notes (Addendum)
 Dear Dr. Knute, Here is my assessment for our mutual patient, Deanna Gross. Thank you for allowing me the opportunity to care for your patient. Please do not hesitate to contact me should you have any other questions. Sincerely, Dr. Eldora Blanch  Otolaryngology Clinic Note Referring provider: Dr. Knute HPI:  Deanna Gross is a 38 y.o. female kindly referred by Dr. Knute for evaluation of allergic rhinitis and chronic sinusitis.  Initial visit: Patient reports that she always has Right > Left sinus pain and pressure (mostly max area) and some headaches. Last 3 years have been the worst since moving. Constant post nasal drip, then triggers cough, then triggers asthma. When infection starts, has a productive cough. Right ear discomfort as  During the infections, has same symptoms but get discolored drainage and symptoms are worse, sometimes teeth sensitivity. Generally hyposmia, but thinks mostly on the right (on left can smell when healthy).She is on doxycycline  (day 4) for a sinus infection. No steroids. She typically gets a sinus infection about once a month - usually gets Augmentin .   She has an allergy appointment upcoming.   She has also has seen Pulm, who put her on Advair , Ipratropium, Flonase , Xyzal. She has tried astelin , but did not help. She uses afrin < 3d, and sudafed (and that helps during really bad) She uses Neti pot intermittently She had prednisone  a few months ago fpr pulm reasons  Mostly happens in fall (after they moved). Also has a young child - worse because of that as well. Maybe had allergy testing when she was young, but nothing recently.  Antibiotics do help generally; number of courses generally - 4-6/year  On omeprazole  as well  --------------------------------------------------------- 09/15/2023 Main complaint is congestion, noted to be on both sides but worse on right. This is chronic. No pain but continues to have some right sided max pressure (always).  Left side does have some issues but not currently. No discolored drainage. Some teeth sensitivity persists. No frontal headache. Using ipratropium. Not using flonase , xyzal. Not using astelin . Using NeilMed sinus rinses. Allergies doing relatively well. Change in barometric pressure does seem to trigger her symptoms. She has had 4 sinus infections since mid-2024 requiring antibiotics  She has had sinus infection in Late Jan 2024 (Augmentin ), April 2024 (Rx: Augmentin ), September 2024 (Doxycycline  for sinusitis, admitted for Asthma exacerbation), Dec 2024 (Rx Augmentin ) and once in 2025 in [place]  (Rx: Augmentin )   H&N Surgery: T&A and Tubes (3 sets) Personal or FHx of bleeding dz or anesthesia difficulty: no  ASA Sens: no  GLP-1: no AP/AC: no  Tobacco: no. Lives in [place].  PMHx: Anxiety, Asthma, Allergic Rhinitis, Chronic cough  Independent Review of Additional Tests or Records:  Referral, Pulm notes reviewed Dr. Ethyl (2021): notes reviewed Dr. Laymond notes (2024): he was previously diagnosed with childhood asthma and only on PRN albuterol  as an adult. Has never been on maintenance inhalers. Began having annual bronchitis in the last three years in late summer/fall. Has chronic allergies requiring xyzal daily and flonase  nightly. Mold and fresh cut grass triggers her allergies. Initially her cough starts nonproductive and dry that becomes productive gets treated with steroids. Feels chest tightness R>L that prevents full breath. Has had wheezing during night and activity. Associated with coughing. Reports reflux associated with stress. Cough improved cold air.  Admitted 09/2022 for asthma exacerbation - given duonebs, solumedrol, mag, and IV steroids and d/c on oral pred and inhalers.  CT Sinus 2021 independent review: Right septal deviation, small retention  cyst roof of right max, narrow b/l OMC, bilateral concha bullosa; trace ethmoid disease, no sphenoid or frontal disease  CT  Sinus 1/21//2025: noted septal deviation with bilateral max modest opacification  with bilateral concha bullosa. PMH/Meds/All/SocHx/FamHx/ROS:   Past Medical History:  Diagnosis Date   Allergic rhinitis 05/03/2015   Anxiety    Asthma    exercise-induced; prn inhaler   Chronic infection of sinus 05/03/2015   Community acquired pneumonia 03/08/2021   Depression    on zoloft , doing well, cut dose.   Eczema    Fibular avulsion fracture, ATFL sprain right ankle 11/16/2020   Fibular avulsion fracture, ATFL sprain right ankle 11/16/2020   Headache(784.0)    sinus   History of abnormal cervical Pap smear 07/26/2019   History of MRSA infection 2008   left leg   History of pre-eclampsia 07/26/2019   HPV in female    Infection    UTI   Insect bites 08/30/2013   IT band syndrome 05/03/2015   NSVD (normal spontaneous vaginal delivery) 09/17/2018   Obesity (BMI 30-39.9) 05/03/2015   PIH (pregnancy induced hypertension), third trimester 09/16/2018   Preeclampsia, severe, third trimester 09/16/2018   Recurrent knee pain 05/03/2015   Recurrent upper respiratory infection (URI)    Seasonal allergies    Subacute cough 04/29/2021   Surgical wound, non healing 08/2013   left breast   Tendinitis, de Quervain's 05/08/2017   Uses condoms for contraception 05/23/2015   Vaginal Pap smear, abnormal      Past Surgical History:  Procedure Laterality Date   ADENOIDECTOMY     BREAST CYST EXCISION Left 03/18/2013   Procedure: EXCISE CYST LEFT MEDIAL BREAST;  Surgeon: Krystal CHRISTELLA Spinner, MD;  Location: [facility];  Service: General;  Laterality: Left;   MASS EXCISION Left 09/01/2013   Procedure: RE-EXCISION NON HEALING WOUND LEFT BREAST;  Surgeon: Krystal CHRISTELLA Spinner, MD;  Location: [facility];  Service: General;  Laterality: Left;   TONSILLECTOMY     TONSILLECTOMY AND ADENOIDECTOMY     TYMPANOSTOMY TUBE PLACEMENT     x 3   WISDOM TOOTH EXTRACTION      Family History   Problem Relation Age of Onset   Hypothyroidism Mother    Thyroid  disease Mother    Eczema Father    Allergic rhinitis Father    Hyperlipidemia Father    Hypertension Father    Depression Father    Asthma Sister    Eczema Sister    Allergic rhinitis Sister    Eczema Brother    Asthma Brother    Allergic rhinitis Brother    Stroke Maternal Grandmother    Cancer Maternal Grandfather        leukemia   Cancer Paternal Grandfather        non-hodgkins lymphoma     Social Connections: Socially Integrated (12/11/2022)   Social Connection and Isolation Panel    Frequency of Communication with Friends and Family: More than three times a week    Frequency of Social Gatherings with Friends and Family: Once a week    Attends Religious Services: More than 4 times per year    Active Member of [employer] or Organizations: Yes    Attends Banker Meetings: 1 to 4 times per year    Marital Status: Married      Current Outpatient Medications:    albuterol  (VENTOLIN  HFA) 108 (90 Base) MCG/ACT inhaler, Inhale 1-2 puffs into the lungs every 6 (six) hours as needed  for wheezing or shortness of breath., Disp: 8 g, Rfl: 0   Cholecalciferol (VITAMIN D3) 1.25 MG (50000 UT) CAPS, Take 1 capsule by mouth daily., Disp: , Rfl:    fluticasone -salmeterol (ADVAIR ) 100-50 MCG/ACT AEPB, Inhale 1 puff into the lungs 2 (two) times daily., Disp: 60 each, Rfl: 11   ipratropium (ATROVENT ) 0.03 % nasal spray, Place 2 sprays into both nostrils every 12 (twelve) hours., Disp: 30 mL, Rfl: 5   ipratropium-albuterol  (DUONEB) 0.5-2.5 (3) MG/3ML SOLN, Take 3 mLs by nebulization every 6 (six) hours as needed., Disp: 90 mL, Rfl: 7   levocetirizine (XYZAL) 5 MG tablet, Take 5 mg by mouth every evening., Disp: , Rfl:    NONFORMULARY OR COMPOUNDED ITEM, [employer] Compound GLP1: LipoSlim - compound injected once weekly, Disp: , Rfl:    ondansetron  (ZOFRAN ) 4 MG tablet, Take 1 tablet (4 mg total) by mouth every 8  (eight) hours as needed for nausea or vomiting., Disp: 20 tablet, Rfl: 3   sertraline  (ZOLOFT ) 100 MG tablet, Take 1 tablet (100 mg total) by mouth daily., Disp: 90 tablet, Rfl: 3   fluticasone  (FLONASE ) 50 MCG/ACT nasal spray, Place 2 sprays into both nostrils daily. (Patient not taking: Reported on 09/15/2023), Disp: 16 g, Rfl: 6   mupirocin  ointment (BACTROBAN ) 2 %, Apply 1 Application topically 2 (two) times daily. (Patient not taking: Reported on 09/15/2023), Disp: 22 g, Rfl: 0   omeprazole  (PRILOSEC) 20 MG capsule, Take 1 capsule (20 mg total) by mouth daily. (Patient not taking: Reported on 09/15/2023), Disp: 30 capsule, Rfl: 2   triamcinolone  ointment (KENALOG ) 0.5 %, Apply 1 Application topically 2 (two) times daily as needed. (Patient not taking: Reported on 09/15/2023), Disp: 30 g, Rfl: 1   Physical Exam:   BP 123/80 (BP Location: Left Arm, Patient Position: Sitting, Cuff Size: Large)   Pulse 72   Ht 5' 3 (1.6 m)   Wt 242 lb (109.8 kg)   LMP 08/21/2023   SpO2 94%   BMI 42.87 kg/m   Salient findings:  CN II-XII intact Anterior rhinoscopy: Septum deviates right; bilateral inferior turbinates with modest hypertrophy; given nature of complaint and exam, further evaluation with nasal endoscopy was necessary and documented below No lesions of oral cavity/oropharynx; dentition intact No obviously palpable neck masses/lymphadenopathy/thyromegaly No respiratory distress or stridor  Seprately Identifiable Procedures:  PROCEDURE: Bilateral Diagnostic Rigid Nasal Endoscopy Pre-procedure diagnosis: Concern for chronic sinusitis, allergic rhinitis Post-procedure diagnosis: same Indication: See pre-procedure diagnosis and physical exam above Complications: None apparent EBL: 0 mL Anesthesia: Lidocaine  4% and topical decongestant was topically sprayed in each nasal cavity  Description of Procedure:  Patient was identified. A rigid 0 degree endoscope was utilized to evaluate the sinonasal  cavities, mucosa, sinus ostia and turbinates and septum.  Overall, signs of mild mucosal inflammation are noted with some mucosal edema right MM.  No mucopurulence, polyps, or masses noted.   Right Middle meatus: clear Right SE Recess: clear Left MM: clear Left SE Recess: clear   CPT CODE -- 68768 - Mod 25   Impression & Plans:  Deanna Gross is a 38 y.o. female with h/o asthma now with:  1. Other acute recurrent sinusitis   2. Nasal septal deviation   3. Nasal congestion   4. Hypertrophy of both inferior nasal turbinates   5. Concha bullosa    Multiple episodes of exacerbation; prior CT with anatomic predisposition to sinusitis including bilateral concha bullosa and narrow OMCs with some max disease. She also has  some allergic symptoms, and has had some benefit with sprays.   She continues to have exacerbations despite medical management and has anatomic predisposition to sinusitis. She also has septal deviation with inferior turbinate hypertrophy; She has had 4 sinus infections over past year requiring antibiotics with improvement.   Infections: April 2024 (Rx: Augmentin ), September 2024 (Doxycycline  for sinusitis, admitted); Dec 2024 (Rx Augmentin ) and once in 2025 in [place]  (Rx: Augmentin )   As such, we did discuss options: septo/turbs/concha and bilateral FESS We discussed the goals of septoplasty and turbinate reduction, and expectations for postoperative management. Will plan to leave splints in place, and removal was also discussed. We also discussed nasal obstruction post-operatively until splints in place and pain management.  We discussed the goals of sinus surgery, and expectations for postoperative management. We discussed R/B/A including pain, infection, bleeding (~3% risk of operative visit for control), persistent symptoms, need for revision surgery, and other risks including damage to the eye and loss of vision, and injury to skull base with risk of CSF leak and  additional intracranial complications, anesthetic complications, among others.  Patient understands and is ready to proceed.  - Continue Flonase  BID, continue Xyzal. - Daily Neilmed sinus rinse - f/u POD 5 - proceed with b/l FESS/septo/turbs/concha  See below regarding exact medications prescribed this encounter including dosages and route:  No orders of the defined types were placed in this encounter.    Thank you for allowing me the opportunity to care for your patient. Please do not hesitate to contact me should you have any other questions.  Sincerely, Eldora Blanch, MD Otolaryngologist (ENT), [facility] Phone: [phone] Fax: [phone]  09/15/2023, 3:05 PM   MDM:  Level 4 Data complexity: mod - independent interpretation of CT - Morbidity: mod - decision for surgery - Prescription Drug prescribed or managed: n

## 2023-09-29 ENCOUNTER — Encounter: Payer: Self-pay | Admitting: Sports Medicine

## 2023-10-08 DIAGNOSIS — H5203 Hypermetropia, bilateral: Secondary | ICD-10-CM | POA: Diagnosis not present

## 2023-10-08 DIAGNOSIS — H52223 Regular astigmatism, bilateral: Secondary | ICD-10-CM | POA: Diagnosis not present

## 2023-11-25 ENCOUNTER — Ambulatory Visit (HOSPITAL_BASED_OUTPATIENT_CLINIC_OR_DEPARTMENT_OTHER): Admitting: Family Medicine

## 2023-12-17 ENCOUNTER — Other Ambulatory Visit (HOSPITAL_COMMUNITY): Payer: Self-pay

## 2023-12-18 ENCOUNTER — Other Ambulatory Visit (HOSPITAL_BASED_OUTPATIENT_CLINIC_OR_DEPARTMENT_OTHER): Payer: Self-pay

## 2023-12-21 ENCOUNTER — Ambulatory Visit (HOSPITAL_BASED_OUTPATIENT_CLINIC_OR_DEPARTMENT_OTHER): Admitting: Family Medicine

## 2024-01-08 ENCOUNTER — Telehealth: Admitting: Family Medicine

## 2024-01-08 DIAGNOSIS — B9789 Other viral agents as the cause of diseases classified elsewhere: Secondary | ICD-10-CM | POA: Diagnosis not present

## 2024-01-08 DIAGNOSIS — J069 Acute upper respiratory infection, unspecified: Secondary | ICD-10-CM

## 2024-01-08 DIAGNOSIS — J329 Chronic sinusitis, unspecified: Secondary | ICD-10-CM | POA: Diagnosis not present

## 2024-01-08 MED ORDER — FLUTICASONE PROPIONATE 50 MCG/ACT NA SUSP: 2.0000 | Freq: Every day | NASAL | 6 refills | Status: AC

## 2024-01-08 NOTE — Progress Notes (Signed)
 We are sorry that you are not feeling well.  Here is how we plan to help!  Based on what you have shared with me it looks like you have sinusitis.  Sinusitis is inflammation and infection in the sinus cavities of the head.  Based on your presentation I believe you most likely have Acute Viral Sinusitis.This is an infection most likely caused by a virus. There is not specific treatment for viral sinusitis other than to help you with the symptoms until the infection runs its course.  You may use an oral decongestant such as Mucinex D or if you have glaucoma or high blood pressure use plain Mucinex. Saline nasal spray help and can safely be used as often as needed for congestion, I have prescribed: Fluticasone nasal spray two sprays in each nostril once a day  Some authorities believe that zinc sprays or the use of Echinacea may shorten the course of your symptoms.  Sinus infections are not as easily transmitted as other respiratory infection, however we still recommend that you avoid close contact with loved ones, especially the very young and elderly.  Remember to wash your hands thoroughly throughout the day as this is the number one way to prevent the spread of infection!  Home Care: Only take medications as instructed by your medical team. Do not take these medications with alcohol. A steam or ultrasonic humidifier can help congestion.  You can place a towel over your head and breathe in the steam from hot water  coming from a faucet. Avoid close contacts especially the very young and the elderly. Cover your mouth when you cough or sneeze. Always remember to wash your hands.  Get Help Right Away If: You develop worsening fever or sinus pain. You develop a severe head ache or visual changes. Your symptoms persist after you have completed your treatment plan.  Make sure you Understand these instructions. Will watch your condition. Will get help right away if you are not doing well or get  worse.  Your e-visit answers were reviewed by a board certified advanced clinical practitioner to complete your personal care plan.  Depending on the condition, your plan could have included both over the counter or prescription medications.  If there is a problem please reply  once you have received a response from your provider.  Your safety is important to us .  If you have drug allergies check your prescription carefully.    You can use MyChart to ask questions about today's visit, request a non-urgent call back, or ask for a work or school excuse for 24 hours related to this e-Visit. If it has been greater than 24 hours you will need to follow up with your provider, or enter a new e-Visit to address those concerns.  You will get an e-mail in the next two days asking about your experience.  I hope that your e-visit has been valuable and will speed your recovery. Thank you for using e-visits.  I have spent 5 minutes in review of e-visit questionnaire, review and updating patient chart, medical decision making and response to patient.   Deanna Kelemen, FNP

## 2024-01-20 ENCOUNTER — Ambulatory Visit (HOSPITAL_BASED_OUTPATIENT_CLINIC_OR_DEPARTMENT_OTHER)
Admission: EM | Admit: 2024-01-20 | Discharge: 2024-01-20 | Disposition: A | Discharge status: Home / Self Care | Attending: Family Medicine | Admitting: Family Medicine

## 2024-01-20 ENCOUNTER — Encounter (HOSPITAL_BASED_OUTPATIENT_CLINIC_OR_DEPARTMENT_OTHER): Payer: Self-pay | Admitting: Emergency Medicine

## 2024-01-20 ENCOUNTER — Other Ambulatory Visit (HOSPITAL_BASED_OUTPATIENT_CLINIC_OR_DEPARTMENT_OTHER): Payer: Self-pay

## 2024-01-20 DIAGNOSIS — J0110 Acute frontal sinusitis, unspecified: Secondary | ICD-10-CM

## 2024-01-20 MED ORDER — PREDNISONE 20 MG PO TABS
40.0000 mg | ORAL_TABLET | Freq: Every day | ORAL | 0 refills | Status: AC
  Filled 2024-01-20: qty 10, 5d supply, fill #0

## 2024-01-20 MED ORDER — AMOXICILLIN 875 MG PO TABS
875.0000 mg | ORAL_TABLET | Freq: Two times a day (BID) | ORAL | 0 refills | Status: AC
  Filled 2024-01-20: qty 14, 7d supply, fill #0

## 2024-01-20 NOTE — Discharge Instructions (Addendum)
 Treating you for a sinus infection and inflammation in the lungs.  Prednisone  as prescribed.  Antibiotics as prescribed. Can continue over-the-counter medications as needed.  Follow-up as needed

## 2024-01-20 NOTE — ED Triage Notes (Signed)
 Pt reports she had cough, sinus pressure and wheezing for about 3 days. Taking Dayquil, Nyquil and albuterol .

## 2024-01-20 NOTE — ED Provider Notes (Signed)
" Deanna Gross CARE    CSN: [id number] Arrival date & time: 01/20/24  9160      History   Chief Complaint Chief Complaint  Patient presents with   Cough    HPI Deanna Gross is a 38 y.o. female.   Pt is a 38 year old female that presents with worsening cough and nasal congestion, sinus pressure. Pt reports she had cough, sinus pressure and wheezing for about 4 days. Taking Dayquil, Nyquil and albuterol .     Cough   Past Medical History:  Diagnosis Date   Allergic rhinitis 05/03/2015   Anxiety    Asthma    exercise-induced; prn inhaler   Chronic infection of sinus 05/03/2015   Community acquired pneumonia 03/08/2021   Depression    on zoloft , doing well, cut dose.   Eczema    Fibular avulsion fracture, ATFL sprain right ankle 11/16/2020   Fibular avulsion fracture, ATFL sprain right ankle 11/16/2020   Headache(784.0)    sinus   History of abnormal cervical Pap smear 07/26/2019   History of MRSA infection 2008   left leg   History of pre-eclampsia 07/26/2019   HPV in female    Infection    UTI   Insect bites 08/30/2013   IT band syndrome 05/03/2015   NSVD (normal spontaneous vaginal delivery) 09/17/2018   Obesity (BMI 30-39.9) 05/03/2015   PIH (pregnancy induced hypertension), third trimester 09/16/2018   Preeclampsia, severe, third trimester 09/16/2018   Recurrent knee pain 05/03/2015   Recurrent upper respiratory infection (URI)    Seasonal allergies    Subacute cough 04/29/2021   Surgical wound, non healing 08/2013   left breast   Tendinitis, de Quervain's 05/08/2017   Uses condoms for contraception 05/23/2015   Vaginal Pap smear, abnormal     Patient Active Problem List   Diagnosis Date Noted   Family history of hypothyroidism 08/25/2023   Chronic rhinitis 12/12/2022   Moderate persistent asthma without complication 12/12/2022   Conjunctivitis 12/12/2022   Asthma exacerbation 10/02/2022   Syncope, vasovagal 10/02/2022   Subacute cough  10/01/2022   Encounter for annual physical exam 09/25/2021   Anxiety 01/01/2021   Dyshidrotic eczema 01/01/2021   BMI 40.0-44.9, adult (HCC) 01/01/2021   Mixed hyperlipidemia 01/01/2021   Generalized anxiety disorder 07/26/2019   HPV in female 05/22/2015   Allergic rhinitis 05/03/2015   Astigmatism 01/13/2012    Past Surgical History:  Procedure Laterality Date   ADENOIDECTOMY     BREAST CYST EXCISION Left 03/18/2013   Procedure: EXCISE CYST LEFT MEDIAL BREAST;  Surgeon: Krystal CHRISTELLA Spinner, MD;  Location:  [facility];  Service: General;  Laterality: Left;   MASS EXCISION Left 09/01/2013   Procedure: RE-EXCISION NON HEALING WOUND LEFT BREAST;  Surgeon: Krystal CHRISTELLA Spinner, MD;  Location: [facility];  Service: General;  Laterality: Left;   TONSILLECTOMY     TONSILLECTOMY AND ADENOIDECTOMY     TYMPANOSTOMY TUBE PLACEMENT     x 3   WISDOM TOOTH EXTRACTION      OB History     Gravida  1   Para  1   Term  1   Preterm  0   AB  0   Living  1      SAB  0   IAB  0   Ectopic  0   Multiple  0   Live Births  1            Home Medications  Prior to Admission medications  Medication Sig Start Date End Date Taking? Authorizing Provider  amoxicillin  (AMOXIL ) 875 MG tablet Take 1 tablet (875 mg total) by mouth 2 (two) times daily for 7 days. 01/20/24 01/27/24 Yes Davy Faught A, FNP  predniSONE  (DELTASONE ) 20 MG tablet Take 2 tablets (40 mg total) by mouth daily with breakfast for 5 days. 01/20/24 01/25/24 Yes Siddiq Kaluzny A, FNP  albuterol  (VENTOLIN  HFA) 108 (90 Base) MCG/ACT inhaler Inhale 1-2 puffs into the lungs every 6 (six) hours as needed for wheezing or shortness of breath. 09/24/22   Moishe Chiquita HERO, NP  Cholecalciferol (VITAMIN D3) 1.25 MG (50000 UT) CAPS Take 1 capsule by mouth daily.    [provider]  fluticasone  (FLONASE ) 50 MCG/ACT nasal spray Place 2 sprays into both nostrils daily. 01/08/24   Blair, Diane W, FNP   fluticasone -salmeterol (ADVAIR ) 100-50 MCG/ACT AEPB Inhale 1 puff into the lungs 2 (two) times daily. 02/06/23   Kassie Acquanetta Bradley, MD  ipratropium (ATROVENT ) 0.03 % nasal spray Place 2 sprays into both nostrils every 12 (twelve) hours. 10/10/22   Kassie Acquanetta Bradley, MD  ipratropium-albuterol  (DUONEB) 0.5-2.5 (3) MG/3ML SOLN Take 3 mLs by nebulization every 6 (six) hours as needed. 10/01/22   Towana Small, FNP  levocetirizine (XYZAL) 5 MG tablet Take 5 mg by mouth every evening.    [provider]  NONFORMULARY OR COMPOUNDED ITEM [employer] Compound GLP1: LipoSlim - compound injected once weekly    [provider]  ondansetron  (ZOFRAN ) 4 MG tablet Take 1 tablet (4 mg total) by mouth every 8 (eight) hours as needed for nausea or vomiting. 08/25/23   Caudle, Thersia Bitters, FNP  sertraline  (ZOLOFT ) 100 MG tablet Take 1 tablet (100 mg total) by mouth daily. 08/25/23   Caudle, Thersia Bitters, FNP    Family History Family History  Problem Relation Age of Onset   Hypothyroidism Mother    Thyroid  disease Mother    Eczema Father    Allergic rhinitis Father    Hyperlipidemia Father    Hypertension Father    Depression Father    Asthma Sister    Eczema Sister    Allergic rhinitis Sister    Eczema Brother    Asthma Brother    Allergic rhinitis Brother    Stroke Maternal Grandmother    Cancer Maternal Grandfather        leukemia   Cancer Paternal Grandfather        non-hodgkins lymphoma    Social History Social History[1]   Allergies   Polytrim [polymyxin b-trimethoprim]   Review of Systems Review of Systems  Respiratory:  Positive for cough.      Physical Exam Triage Vital Signs ED Triage Vitals [01/20/24 0900]  Encounter Vitals Group     BP 122/85     Girls Systolic BP Percentile      Girls Diastolic BP Percentile      Boys Systolic BP Percentile      Boys Diastolic BP Percentile      Pulse Rate 72     Resp 18     Temp 98.5 F (36.9 C)     Temp  Source Oral     SpO2 96 %     Weight      Height      Head Circumference      Peak Flow      Pain Score 4     Pain Loc      Pain Education  Exclude from Growth Chart    No data found.  Updated Vital Signs BP 122/85 (BP Location: Left Arm)   Pulse 72   Temp 98.5 F (36.9 C) (Oral)   Resp 18   LMP 01/06/2024 (Approximate)   SpO2 96%   Visual Acuity Right Eye Distance:   Left Eye Distance:   Bilateral Distance:    Right Eye Near:   Left Eye Near:    Bilateral Near:     Physical Exam Constitutional:      General: She is not in acute distress.    Appearance: Normal appearance. She is ill-appearing. She is not toxic-appearing or diaphoretic.  HENT:     Head: Normocephalic and atraumatic.     Right Ear: Tympanic membrane and ear canal normal.     Left Ear: Tympanic membrane and ear canal normal.     Nose: Congestion present.     Mouth/Throat:     Pharynx: Oropharynx is clear.  Eyes:     Conjunctiva/sclera: Conjunctivae normal.  Cardiovascular:     Rate and Rhythm: Normal rate and regular rhythm.     Pulses: Normal pulses.     Heart sounds: Normal heart sounds.  Pulmonary:     Effort: Pulmonary effort is normal.     Breath sounds: Rhonchi present.  Skin:    General: Skin is warm and dry.  Neurological:     Mental Status: She is alert.  Psychiatric:        Mood and Affect: Mood normal.      UC Treatments / Results  Labs (all labs ordered are listed, but only abnormal results are displayed) Labs Reviewed - No data to display  EKG   Radiology No results found.  Procedures Procedures (including critical care time)  Medications Ordered in UC Medications - No data to display  Initial Impression / Assessment and Plan / UC Course  I have reviewed the triage vital signs and the nursing notes.  Pertinent labs & imaging results that were available during my care of the patient were reviewed by me and considered in my medical decision making (see chart  for details).     Acute sinusitis- Treating for a sinus infection and inflammation in the lungs.  Prednisone  as prescribed.  Antibiotics as prescribed. Can continue over-the-counter medications as needed.  Follow-up as needed Final Clinical Impressions(s) / UC Diagnoses   Final diagnoses:  Acute non-recurrent frontal sinusitis     Discharge Instructions      Treating you for a sinus infection and inflammation in the lungs.  Prednisone  as prescribed.  Antibiotics as prescribed. Can continue over-the-counter medications as needed.  Follow-up as needed     ED Prescriptions     Medication Sig Dispense Auth. Provider   predniSONE  (DELTASONE ) 20 MG tablet Take 2 tablets (40 mg total) by mouth daily with breakfast for 5 days. 10 tablet Harutyun Monteverde A, FNP   amoxicillin  (AMOXIL ) 875 MG tablet Take 1 tablet (875 mg total) by mouth 2 (two) times daily for 7 days. 14 tablet Adah Wilbert LABOR, FNP      PDMP not reviewed this encounter.     [1]  Social History Tobacco Use   Smoking status: Never    Passive exposure: Past   Smokeless tobacco: Never  Vaping Use   Vaping status: Never Used  Substance Use Topics   Alcohol use: Not Currently    Comment: rarely   Drug use: No     Adah Wilbert LABOR, FNP 01/20/24  0929 ° °"

## 2024-02-12 ENCOUNTER — Ambulatory Visit (HOSPITAL_BASED_OUTPATIENT_CLINIC_OR_DEPARTMENT_OTHER): Admitting: Family Medicine

## 2024-03-01 ENCOUNTER — Ambulatory Visit (HOSPITAL_BASED_OUTPATIENT_CLINIC_OR_DEPARTMENT_OTHER): Admitting: Family Medicine

## 2024-04-06 ENCOUNTER — Ambulatory Visit (HOSPITAL_BASED_OUTPATIENT_CLINIC_OR_DEPARTMENT_OTHER): Admitting: Family Medicine

## 2024-05-09 ENCOUNTER — Encounter (HOSPITAL_BASED_OUTPATIENT_CLINIC_OR_DEPARTMENT_OTHER): Payer: Self-pay

## 2024-05-09 ENCOUNTER — Ambulatory Visit (HOSPITAL_BASED_OUTPATIENT_CLINIC_OR_DEPARTMENT_OTHER): Admit: 2024-05-09 | Admitting: Otolaryngology

## 2024-05-09 SURGERY — SURGERY, PARANASAL SINUS, ENDOSCOPIC, WITH NASAL SEPTOPLASTY, TURBINOPLASTY, AND MAXILLARY SINUSOTOMY
Anesthesia: General | Laterality: Bilateral

## 2024-05-16 ENCOUNTER — Ambulatory Visit (INDEPENDENT_AMBULATORY_CARE_PROVIDER_SITE_OTHER): Admitting: Otolaryngology
